# Patient Record
Sex: Female | Born: 1965 | Race: White | Hispanic: No | Marital: Married | Smoking: Former smoker
Health system: Southern US, Community
[De-identification: ages and names within clinical notes are randomized; demographics above are authoritative.]

## PROBLEM LIST (undated history)

## (undated) DIAGNOSIS — Z803 Family history of malignant neoplasm of breast: Secondary | ICD-10-CM

## (undated) DIAGNOSIS — Z800 Family history of malignant neoplasm of digestive organs: Secondary | ICD-10-CM

## (undated) DIAGNOSIS — E079 Disorder of thyroid, unspecified: Secondary | ICD-10-CM

## (undated) DIAGNOSIS — D239 Other benign neoplasm of skin, unspecified: Secondary | ICD-10-CM

## (undated) DIAGNOSIS — F32A Depression, unspecified: Secondary | ICD-10-CM

## (undated) DIAGNOSIS — F329 Major depressive disorder, single episode, unspecified: Secondary | ICD-10-CM

## (undated) DIAGNOSIS — I10 Essential (primary) hypertension: Secondary | ICD-10-CM

## (undated) DIAGNOSIS — F509 Eating disorder, unspecified: Secondary | ICD-10-CM

## (undated) DIAGNOSIS — Z973 Presence of spectacles and contact lenses: Secondary | ICD-10-CM

## (undated) DIAGNOSIS — E039 Hypothyroidism, unspecified: Secondary | ICD-10-CM

## (undated) HISTORY — DX: Family history of malignant neoplasm of breast: Z80.3

## (undated) HISTORY — PX: CHOLECYSTECTOMY: SHX55

## (undated) HISTORY — PX: TUBAL LIGATION: SHX77

## (undated) HISTORY — DX: Family history of malignant neoplasm of digestive organs: Z80.0

## (undated) HISTORY — DX: Other benign neoplasm of skin, unspecified: D23.9

## (undated) HISTORY — PX: DILATION AND CURETTAGE OF UTERUS: SHX78

## (undated) SURGERY — LAPAROSCOPY, DIAGNOSTIC
Anesthesia: General

---

## 2000-04-21 ENCOUNTER — Other Ambulatory Visit: Admission: RE | Admit: 2000-04-21 | Discharge: 2000-04-21 | Payer: Self-pay | Admitting: Family Medicine

## 2001-02-16 ENCOUNTER — Encounter (HOSPITAL_COMMUNITY): Admission: RE | Admit: 2001-02-16 | Discharge: 2001-05-17 | Payer: Self-pay | Admitting: Family Medicine

## 2002-05-24 ENCOUNTER — Other Ambulatory Visit: Admission: RE | Admit: 2002-05-24 | Discharge: 2002-05-24 | Payer: Self-pay | Admitting: Family Medicine

## 2004-12-08 ENCOUNTER — Other Ambulatory Visit: Admission: RE | Admit: 2004-12-08 | Discharge: 2004-12-08 | Payer: Self-pay | Admitting: Family Medicine

## 2005-11-29 ENCOUNTER — Encounter: Admission: RE | Admit: 2005-11-29 | Discharge: 2005-11-29 | Payer: Self-pay | Admitting: Family Medicine

## 2005-12-09 ENCOUNTER — Other Ambulatory Visit: Admission: RE | Admit: 2005-12-09 | Discharge: 2005-12-09 | Payer: Self-pay | Admitting: Family Medicine

## 2007-02-08 ENCOUNTER — Other Ambulatory Visit: Admission: RE | Admit: 2007-02-08 | Discharge: 2007-02-08 | Payer: Self-pay | Admitting: Family Medicine

## 2008-02-19 ENCOUNTER — Encounter: Admission: RE | Admit: 2008-02-19 | Discharge: 2008-02-19 | Payer: Self-pay | Admitting: Family Medicine

## 2008-03-17 ENCOUNTER — Other Ambulatory Visit: Admission: RE | Admit: 2008-03-17 | Discharge: 2008-03-17 | Payer: Self-pay | Admitting: Family Medicine

## 2009-04-08 ENCOUNTER — Other Ambulatory Visit: Admission: RE | Admit: 2009-04-08 | Discharge: 2009-04-08 | Payer: Self-pay | Admitting: Family Medicine

## 2009-09-16 ENCOUNTER — Encounter: Admission: RE | Admit: 2009-09-16 | Discharge: 2009-09-16 | Payer: Self-pay | Admitting: Family Medicine

## 2010-07-20 ENCOUNTER — Encounter: Admission: RE | Admit: 2010-07-20 | Discharge: 2010-07-20 | Payer: Self-pay | Admitting: Family Medicine

## 2010-08-24 ENCOUNTER — Emergency Department (HOSPITAL_COMMUNITY): Admission: EM | Admit: 2010-08-24 | Discharge: 2010-08-24 | Payer: Self-pay | Admitting: Emergency Medicine

## 2010-09-15 ENCOUNTER — Ambulatory Visit (HOSPITAL_COMMUNITY): Admission: RE | Admit: 2010-09-15 | Discharge: 2010-09-15 | Payer: Self-pay | Admitting: Obstetrics and Gynecology

## 2010-10-13 ENCOUNTER — Encounter
Admission: RE | Admit: 2010-10-13 | Discharge: 2010-10-13 | Payer: Self-pay | Source: Home / Self Care | Admitting: Obstetrics and Gynecology

## 2010-11-29 ENCOUNTER — Encounter: Payer: Self-pay | Admitting: Family Medicine

## 2010-12-08 ENCOUNTER — Other Ambulatory Visit (HOSPITAL_COMMUNITY): Payer: Self-pay | Admitting: Obstetrics and Gynecology

## 2010-12-08 DIAGNOSIS — N971 Female infertility of tubal origin: Secondary | ICD-10-CM

## 2010-12-13 ENCOUNTER — Ambulatory Visit (HOSPITAL_COMMUNITY)
Admission: RE | Admit: 2010-12-13 | Discharge: 2010-12-13 | Disposition: A | Discharge status: Home / Self Care | Payer: 59 | Source: Ambulatory Visit | Attending: Obstetrics and Gynecology | Admitting: Obstetrics and Gynecology

## 2010-12-13 ENCOUNTER — Encounter (HOSPITAL_COMMUNITY): Payer: Self-pay

## 2010-12-13 DIAGNOSIS — N971 Female infertility of tubal origin: Secondary | ICD-10-CM

## 2010-12-13 DIAGNOSIS — Z3049 Encounter for surveillance of other contraceptives: Secondary | ICD-10-CM | POA: Insufficient documentation

## 2011-01-18 LAB — CBC
HCT: 38.4 % (ref 36.0–46.0)
Hemoglobin: 13.3 g/dL (ref 12.0–15.0)
MCH: 33.7 pg (ref 26.0–34.0)
MCHC: 34.5 g/dL (ref 30.0–36.0)
MCV: 97.4 fL (ref 78.0–100.0)
Platelets: 269 10*3/uL (ref 150–400)
RBC: 3.94 MIL/uL (ref 3.87–5.11)
RDW: 12.3 % (ref 11.5–15.5)
WBC: 9.7 10*3/uL (ref 4.0–10.5)

## 2011-01-18 LAB — PREGNANCY, URINE: Preg Test, Ur: NEGATIVE

## 2011-01-20 LAB — CBC
HCT: 38.8 % (ref 36.0–46.0)
Hemoglobin: 13.4 g/dL (ref 12.0–15.0)
MCH: 33.8 pg (ref 26.0–34.0)
MCHC: 34.6 g/dL (ref 30.0–36.0)
MCV: 97.8 fL (ref 78.0–100.0)
Platelets: 253 10*3/uL (ref 150–400)
RBC: 3.97 MIL/uL (ref 3.87–5.11)
RDW: 12.6 % (ref 11.5–15.5)
WBC: 18.9 10*3/uL — ABNORMAL HIGH (ref 4.0–10.5)

## 2011-02-02 LAB — PREGNANCY, URINE: Preg Test, Ur: NEGATIVE

## 2011-09-15 ENCOUNTER — Other Ambulatory Visit: Payer: Self-pay | Admitting: Obstetrics and Gynecology

## 2011-09-15 DIAGNOSIS — Z1231 Encounter for screening mammogram for malignant neoplasm of breast: Secondary | ICD-10-CM

## 2011-10-19 ENCOUNTER — Ambulatory Visit: Payer: 59

## 2011-10-26 ENCOUNTER — Other Ambulatory Visit: Payer: Self-pay | Admitting: Obstetrics and Gynecology

## 2011-11-14 ENCOUNTER — Ambulatory Visit: Payer: 59

## 2011-11-14 DIAGNOSIS — F411 Generalized anxiety disorder: Secondary | ICD-10-CM

## 2011-11-14 DIAGNOSIS — Z7721 Contact with and (suspected) exposure to potentially hazardous body fluids: Secondary | ICD-10-CM

## 2011-12-18 ENCOUNTER — Encounter (HOSPITAL_COMMUNITY): Payer: Self-pay

## 2011-12-18 ENCOUNTER — Inpatient Hospital Stay (HOSPITAL_COMMUNITY)
Admission: EM | Admit: 2011-12-18 | Discharge: 2011-12-20 | DRG: 603 | Disposition: A | Discharge status: Home / Self Care | Payer: 59 | Attending: Internal Medicine | Admitting: Internal Medicine

## 2011-12-18 DIAGNOSIS — F32A Depression, unspecified: Secondary | ICD-10-CM | POA: Diagnosis present

## 2011-12-18 DIAGNOSIS — D72829 Elevated white blood cell count, unspecified: Secondary | ICD-10-CM | POA: Diagnosis present

## 2011-12-18 DIAGNOSIS — F411 Generalized anxiety disorder: Secondary | ICD-10-CM | POA: Diagnosis present

## 2011-12-18 DIAGNOSIS — A4901 Methicillin susceptible Staphylococcus aureus infection, unspecified site: Secondary | ICD-10-CM | POA: Diagnosis present

## 2011-12-18 DIAGNOSIS — L03211 Cellulitis of face: Principal | ICD-10-CM | POA: Diagnosis present

## 2011-12-18 DIAGNOSIS — L0201 Cutaneous abscess of face: Principal | ICD-10-CM | POA: Diagnosis present

## 2011-12-18 DIAGNOSIS — E039 Hypothyroidism, unspecified: Secondary | ICD-10-CM | POA: Diagnosis present

## 2011-12-18 DIAGNOSIS — L03213 Periorbital cellulitis: Secondary | ICD-10-CM | POA: Diagnosis present

## 2011-12-18 DIAGNOSIS — F3289 Other specified depressive episodes: Secondary | ICD-10-CM | POA: Diagnosis present

## 2011-12-18 DIAGNOSIS — I10 Essential (primary) hypertension: Secondary | ICD-10-CM | POA: Diagnosis present

## 2011-12-18 DIAGNOSIS — F419 Anxiety disorder, unspecified: Secondary | ICD-10-CM | POA: Diagnosis present

## 2011-12-18 DIAGNOSIS — F329 Major depressive disorder, single episode, unspecified: Secondary | ICD-10-CM | POA: Diagnosis present

## 2011-12-18 HISTORY — DX: Disorder of thyroid, unspecified: E07.9

## 2011-12-18 HISTORY — DX: Essential (primary) hypertension: I10

## 2011-12-18 HISTORY — DX: Hypothyroidism, unspecified: E03.9

## 2011-12-18 LAB — POCT I-STAT, CHEM 8
BUN: 9 mg/dL (ref 6–23)
Calcium, Ion: 1.29 mmol/L (ref 1.12–1.32)
Chloride: 102 mEq/L (ref 96–112)
Creatinine, Ser: 0.6 mg/dL (ref 0.50–1.10)
Glucose, Bld: 111 mg/dL — ABNORMAL HIGH (ref 70–99)
HCT: 39 % (ref 36.0–46.0)
Hemoglobin: 13.3 g/dL (ref 12.0–15.0)
Potassium: 4.1 mEq/L (ref 3.5–5.1)
Sodium: 140 mEq/L (ref 135–145)
TCO2: 29 mmol/L (ref 0–100)

## 2011-12-18 MED ORDER — SODIUM CHLORIDE 0.9 % IV BOLUS (SEPSIS)
1000.0000 mL | Freq: Once | INTRAVENOUS | Status: AC
  Administered 2011-12-19: 1000 mL via INTRAVENOUS

## 2011-12-18 MED ORDER — VANCOMYCIN HCL IN DEXTROSE 1-5 GM/200ML-% IV SOLN
1000.0000 mg | Freq: Once | INTRAVENOUS | Status: AC
  Administered 2011-12-19: 1000 mg via INTRAVENOUS
  Filled 2011-12-18 (×2): qty 200

## 2011-12-18 MED ORDER — HYDROMORPHONE HCL PF 1 MG/ML IJ SOLN
1.0000 mg | Freq: Once | INTRAMUSCULAR | Status: AC
  Administered 2011-12-19: 1 mg via INTRAVENOUS
  Filled 2011-12-18: qty 1

## 2011-12-18 NOTE — ED Notes (Signed)
Pt states that she had a pimple looking bump on her forehead Thursday and was seen at the clinic yesterday, now its in both eyes and her face and head are sore

## 2011-12-18 NOTE — ED Provider Notes (Signed)
History     CSN: [id number]  Arrival date & time 12/18/11  2133   First MD Initiated Contact with Patient 12/18/11 2243      Chief Complaint  Patient presents with  . Cellulitis     HPI  History provided by the patient. Patient is a 46 year old female with history of hypertension and hypothyroidism who presents with complaints of worsening facial swelling and infection. Patient reports having symptoms began 3 days ago with a small pimple-like nodule to her right eyebrow area. Area began to grow as well with erythema and swelling around right eye. Patient went to the walk-in clinic yesterday and was given prescription for doxycycline for possible skin infection. Patient began taking this but woke up this morning with swelling around left eye as well. Swelling continued to worsen around both eyes throughout the day. Patient reports increasing pain with generalized headache as well. She denies changes in vision. She reports some pain with movement right eye. Patient denies having any fever, chills, sweats. Patient denies any recent nasal congestion or sinus infection.    Past Medical History  Diagnosis Date  . Thyroid disease   . Hypertension     History reviewed. No pertinent past surgical history.  History reviewed. No pertinent family history.  History  Substance Use Topics  . Smoking status: Not on file  . Smokeless tobacco: Not on file  . Alcohol Use: No    OB History    Grav Para Term Preterm Abortions TAB SAB Ect Mult Living                  Review of Systems  Constitutional: Negative for fever and chills.  Eyes: Positive for pain. Negative for photophobia and visual disturbance.  Respiratory: Negative for shortness of breath.   Gastrointestinal: Negative for nausea and vomiting.    Allergies  Review of patient's allergies indicates no known allergies.  Home Medications   Current Outpatient Rx  Name Route Sig Dispense Refill  . ALPRAZOLAM 0.25 MG PO TABS  Oral Take 0.25 mg by mouth 3 (three) times daily as needed.    . BUPROPION HCL ER (XL) 300 MG PO TB24 Oral Take 300 mg by mouth daily.    Marland Kitchen CETIRIZINE HCL 10 MG PO TABS Oral Take 10 mg by mouth daily.    Marland Kitchen VITAMIN D 1000 UNITS PO TABS Oral Take 2,000 Units by mouth daily.    Marland Kitchen FLUOXETINE HCL 40 MG PO CAPS Oral Take 40 mg by mouth daily.    Marland Kitchen LEVOTHYROXINE SODIUM 100 MCG PO TABS Oral Take 100 mcg by mouth daily.    Marland Kitchen LISINOPRIL 10 MG PO TABS Oral Take 10 mg by mouth daily.    Marland Kitchen ZOLPIDEM TARTRATE 10 MG PO TABS Oral Take 10 mg by mouth at bedtime as needed. Insomnia      BP 143/91  Pulse 86  Temp(Src) 99.5 F (37.5 C) (Oral)  Resp 20  SpO2 99%  Physical Exam  Nursing note and vitals reviewed. Constitutional: She is oriented to person, place, and time. She appears well-developed and well-nourished. No distress.  HENT:  Head: Normocephalic and atraumatic.  Right Ear: External ear normal.  Left Ear: External ear normal.  Mouth/Throat: Oropharynx is clear and moist.       Bilateral periorbital swelling with erythema. 1 cm erythematous nodule with pointing above medial right eyebrow. No active drainage.  Eyes: Conjunctivae are normal. Pupils are equal, round, and reactive to light.  Neck: Normal range  of motion. Neck supple.  Cardiovascular: Normal rate and regular rhythm.   Pulmonary/Chest: Effort normal and breath sounds normal. No respiratory distress. She has no wheezes.  Neurological: She is alert and oriented to person, place, and time.  Skin: Skin is warm and dry. Rash noted. There is erythema.       See HEENT exam  Psychiatric: She has a normal mood and affect. Her behavior is normal.    ED Course  Procedures   INCISION AND DRAINAGE Performed by: Angus Seller Consent: Verbal consent obtained. Risks and benefits: risks, benefits and alternatives were discussed Type: abscess  Body area: Right eyebrow  Anesthesia: local infiltration  Local anesthetic: lidocaine 2% with  epinephrine  Anesthetic total: 1 ml  Complexity: complex Blunt dissection to break up loculations  Drainage: purulent  Drainage amount: Small to moderate    Patient tolerance: Patient tolerated the procedure well with no immediate complications.  Results for orders placed during the hospital encounter of 12/18/11  CBC      Component Value Range   WBC 13.2 (*) 4.0 - 10.5 (K/uL)   RBC 3.60 (*) 3.87 - 5.11 (MIL/uL)   Hemoglobin 12.9  12.0 - 15.0 (g/dL)   HCT 40.9  81.1 - 91.4 (%)   MCV 101.4 (*) 78.0 - 100.0 (fL)   MCH 35.8 (*) 26.0 - 34.0 (pg)   MCHC 35.3  30.0 - 36.0 (g/dL)   RDW 78.2  95.6 - 21.3 (%)   Platelets 261  150 - 400 (K/uL)  DIFFERENTIAL      Component Value Range   Neutrophils Relative 69  43 - 77 (%)   Neutro Abs 9.0 (*) 1.7 - 7.7 (K/uL)   Lymphocytes Relative 23  12 - 46 (%)   Lymphs Abs 3.0  0.7 - 4.0 (K/uL)   Monocytes Relative 6  3 - 12 (%)   Monocytes Absolute 0.7  0.1 - 1.0 (K/uL)   Eosinophils Relative 3  0 - 5 (%)   Eosinophils Absolute 0.3  0.0 - 0.7 (K/uL)   Basophils Relative 0  0 - 1 (%)   Basophils Absolute 0.0  0.0 - 0.1 (K/uL)  POCT I-STAT, CHEM 8      Component Value Range   Sodium 140  135 - 145 (mEq/L)   Potassium 4.1  3.5 - 5.1 (mEq/L)   Chloride 102  96 - 112 (mEq/L)   BUN 9  6 - 23 (mg/dL)   Creatinine, Ser 0.86  0.50 - 1.10 (mg/dL)   Glucose, Bld 578 (*) 70 - 99 (mg/dL)   Calcium, Ion 4.69  6.29 - 1.32 (mmol/L)   TCO2 29  0 - 100 (mmol/L)   Hemoglobin 13.3  12.0 - 15.0 (g/dL)   HCT 52.8  41.3 - 24.4 (%)     1. Periorbital cellulitis   2. Abscess of forehead       MDM  10:50 PM patient seen and evaluated. Patient in no acute distress.  Pt seen and discussed with Attending physician. Plan to admit for facial cellulitis      Angus Seller, [place] 12/19/11 [phone]

## 2011-12-19 ENCOUNTER — Encounter (HOSPITAL_COMMUNITY): Payer: Self-pay | Admitting: *Deleted

## 2011-12-19 ENCOUNTER — Inpatient Hospital Stay (HOSPITAL_COMMUNITY): Payer: 59

## 2011-12-19 LAB — DIFFERENTIAL
Basophils Absolute: 0 10*3/uL (ref 0.0–0.1)
Basophils Relative: 0 % (ref 0–1)
Eosinophils Absolute: 0.3 10*3/uL (ref 0.0–0.7)
Eosinophils Relative: 3 % (ref 0–5)
Lymphocytes Relative: 23 % (ref 12–46)
Lymphs Abs: 3 10*3/uL (ref 0.7–4.0)
Monocytes Absolute: 0.7 10*3/uL (ref 0.1–1.0)
Monocytes Relative: 6 % (ref 3–12)
Neutro Abs: 9 10*3/uL — ABNORMAL HIGH (ref 1.7–7.7)
Neutrophils Relative %: 69 % (ref 43–77)

## 2011-12-19 LAB — CBC
HCT: 34.8 % — ABNORMAL LOW (ref 36.0–46.0)
HCT: 36.5 % (ref 36.0–46.0)
Hemoglobin: 11.6 g/dL — ABNORMAL LOW (ref 12.0–15.0)
Hemoglobin: 12.9 g/dL (ref 12.0–15.0)
MCH: 33.8 pg (ref 26.0–34.0)
MCH: 35.8 pg — ABNORMAL HIGH (ref 26.0–34.0)
MCHC: 33.3 g/dL (ref 30.0–36.0)
MCHC: 35.3 g/dL (ref 30.0–36.0)
MCV: 101.4 fL — ABNORMAL HIGH (ref 78.0–100.0)
MCV: 101.5 fL — ABNORMAL HIGH (ref 78.0–100.0)
Platelets: 259 10*3/uL (ref 150–400)
Platelets: 261 10*3/uL (ref 150–400)
RBC: 3.43 MIL/uL — ABNORMAL LOW (ref 3.87–5.11)
RBC: 3.6 MIL/uL — ABNORMAL LOW (ref 3.87–5.11)
RDW: 12.4 % (ref 11.5–15.5)
RDW: 12.4 % (ref 11.5–15.5)
WBC: 12.5 10*3/uL — ABNORMAL HIGH (ref 4.0–10.5)
WBC: 13.2 10*3/uL — ABNORMAL HIGH (ref 4.0–10.5)

## 2011-12-19 LAB — CREATININE, SERUM
Creatinine, Ser: 0.64 mg/dL (ref 0.50–1.10)
GFR calc Af Amer: >90 mL/min (ref >90)
GFR calc non Af Amer: >90 mL/min (ref >90)

## 2011-12-19 LAB — TSH: TSH: 7.36 u[IU]/mL — ABNORMAL HIGH (ref 0.350–4.500)

## 2011-12-19 MED ORDER — ONDANSETRON HCL 4 MG PO TABS: 4.0000 mg | ORAL_TABLET | Freq: Four times a day (QID) | ORAL | Status: DC | PRN

## 2011-12-19 MED ORDER — OXYCODONE HCL 5 MG PO TABS
5.0000 mg | ORAL_TABLET | ORAL | Status: DC | PRN
  Administered 2011-12-19 – 2011-12-20 (×7): 5 mg via ORAL
  Filled 2011-12-19 (×7): qty 1

## 2011-12-19 MED ORDER — ALUM & MAG HYDROXIDE-SIMETH 200-200-20 MG/5ML PO SUSP: 30.0000 mL | Freq: Four times a day (QID) | ORAL | Status: DC | PRN

## 2011-12-19 MED ORDER — VANCOMYCIN HCL IN DEXTROSE 1-5 GM/200ML-% IV SOLN
1000.0000 mg | Freq: Two times a day (BID) | INTRAVENOUS | Status: DC
  Administered 2011-12-19 – 2011-12-20 (×3): 1000 mg via INTRAVENOUS
  Filled 2011-12-19 (×5): qty 200

## 2011-12-19 MED ORDER — FLUOXETINE HCL 20 MG PO CAPS
40.0000 mg | ORAL_CAPSULE | Freq: Every day | ORAL | Status: DC
  Administered 2011-12-19 – 2011-12-20 (×2): 40 mg via ORAL
  Filled 2011-12-19 (×2): qty 2

## 2011-12-19 MED ORDER — HYDROMORPHONE HCL PF 1 MG/ML IJ SOLN
1.0000 mg | Freq: Once | INTRAMUSCULAR | Status: AC
  Administered 2011-12-19: 1 mg via INTRAVENOUS
  Filled 2011-12-19: qty 1

## 2011-12-19 MED ORDER — ONDANSETRON HCL 4 MG/2ML IJ SOLN: 4.0000 mg | Freq: Four times a day (QID) | INTRAMUSCULAR | Status: DC | PRN

## 2011-12-19 MED ORDER — ALPRAZOLAM 0.25 MG PO TABS: 0.2500 mg | ORAL_TABLET | Freq: Three times a day (TID) | ORAL | Status: DC | PRN

## 2011-12-19 MED ORDER — LORATADINE 10 MG PO TABS
10.0000 mg | ORAL_TABLET | Freq: Every day | ORAL | Status: DC | PRN
  Filled 2011-12-19: qty 1

## 2011-12-19 MED ORDER — LISINOPRIL 10 MG PO TABS
10.0000 mg | ORAL_TABLET | Freq: Every day | ORAL | Status: DC
  Administered 2011-12-19 – 2011-12-20 (×2): 10 mg via ORAL
  Filled 2011-12-19 (×2): qty 1

## 2011-12-19 MED ORDER — IOHEXOL 300 MG/ML  SOLN
100.0000 mL | Freq: Once | INTRAMUSCULAR | Status: AC | PRN
  Administered 2011-12-19: 100 mL via INTRAVENOUS

## 2011-12-19 MED ORDER — ENOXAPARIN SODIUM 40 MG/0.4ML ~~LOC~~ SOLN
40.0000 mg | SUBCUTANEOUS | Status: DC
  Administered 2011-12-19 – 2011-12-20 (×2): 40 mg via SUBCUTANEOUS
  Filled 2011-12-19 (×2): qty 0.4

## 2011-12-19 MED ORDER — LEVOTHYROXINE SODIUM 100 MCG PO TABS
100.0000 ug | ORAL_TABLET | Freq: Every day | ORAL | Status: DC
  Administered 2011-12-19 – 2011-12-20 (×2): 100 ug via ORAL
  Filled 2011-12-19 (×2): qty 1

## 2011-12-19 MED ORDER — ACETAMINOPHEN 650 MG RE SUPP: 650.0000 mg | Freq: Four times a day (QID) | RECTAL | Status: DC | PRN

## 2011-12-19 MED ORDER — ZOLPIDEM TARTRATE 10 MG PO TABS: 10.0000 mg | ORAL_TABLET | Freq: Every evening | ORAL | Status: DC | PRN

## 2011-12-19 MED ORDER — ACETAMINOPHEN 325 MG PO TABS
650.0000 mg | ORAL_TABLET | Freq: Four times a day (QID) | ORAL | Status: DC | PRN
  Administered 2011-12-19 – 2011-12-20 (×3): 650 mg via ORAL
  Filled 2011-12-19 (×3): qty 2

## 2011-12-19 NOTE — H&P (Signed)
PCP:   Cala Bradford, MD, MD   Chief Complaint:  Forehead area pain, erythema and edema involving orbits right greater than left.  HPI: This 46 year old female presents to [facility] long emergency room for evaluation. She had what appeared to be a pimple to the right forehead area start about Wednesday of last week. This progressed with increased swelling, pain, erythema, and edema that began to involve first the right and in the left orbit with swelling and erythema. Went to [place] [facility] and was given an injection of unknown antibiotic and then started on by mouth doxycycline. Despite the antibiotics she found that the pain and swelling continued to worsen and she comes to the emergency room today. She denies any fever at home. No nausea or vomiting or associated chills. She has had no visual changes associated with this presentation. Since arrival at the emergency room she had incision and drainage of the wound with swab sent for culture. Noted to have a low-grade temperature at 99.5. Also leukocytosis at 13.2. She was given a dose of vancomycin by the emergency department physician. She was referred to triad hospitalist for admission will go to team 1. Review of Systems:  The patient denies anorexia, fever, weight loss,, vision loss, decreased hearing, hoarseness, chest pain, syncope, dyspnea on exertion, peripheral edema, balance deficits, hemoptysis, abdominal pain, melena, hematochezia, severe indigestion/heartburn, hematuria, incontinence, genital sores, muscle weakness, suspicious skin lesions, transient blindness, difficulty walking, depression, unusual weight change, abnormal bleeding, enlarged lymph nodes, angioedema, and breast masses.  Past Medical History: Past Medical History  Diagnosis Date  . Thyroid disease   . Hypertension    History reviewed. No pertinent past surgical history. History also includes allergic rhinitis, depression, anxiety.  Medications: Prior to  Admission medications   Medication Sig Start Date End Date Taking? Authorizing Provider  ALPRAZolam (XANAX) 0.25 MG tablet Take 0.25 mg by mouth 3 (three) times daily as needed.   Yes Historical Provider, MD  buPROPion (WELLBUTRIN XL) 300 MG 24 hr tablet Take 300 mg by mouth daily.  Discontinued NO Historical Provider, MD  cetirizine (ZYRTEC) 10 MG tablet Take 10 mg by mouth daily.   Yes Historical Provider, MD  cholecalciferol (VITAMIN D) 1000 UNITS tablet Take 2,000 Units by mouth daily.   Yes Historical Provider, MD  FLUoxetine (PROZAC) 40 MG capsule Take 40 mg by mouth daily.   Yes Historical Provider, MD  levothyroxine (SYNTHROID, LEVOTHROID) 100 MCG tablet Take 100 mcg by mouth daily.   Yes Historical Provider, MD  lisinopril (PRINIVIL,ZESTRIL) 10 MG tablet Take 10 mg by mouth daily.   Yes Historical Provider, MD  zolpidem (AMBIEN) 10 MG tablet Take 10 mg by mouth at bedtime as needed. Insomnia   Yes Historical Provider, MD    Allergies:  No Known Allergies  Social History Patient does smoke currently tobacco at a rate of one half pack daily. She is not interested in smoking cessation. She declines a nicotine patch. States she has daily alcohol intake of one drink daily. She states she has never experienced alcohol withdrawal. She states she does occasionally use recreational drugs and smoked crack about one week ago. The patient is normally at baseline able to function independently..  Family History: Family history positive for breast cancer in mother and hypertension in father.  Physical Exam: Filed Vitals:   12/18/11 2205 12/19/11 0207 12/19/11 0346  BP: 143/91 120/72 112/70  Pulse: 86 62 65  Temp: 99.5 F (37.5 C)    TempSrc: Oral  Resp: 20 16 15   SpO2: 99% 95% 98%   HEENT head is normocephalic. There is a wound just above the right orbital area that has been surgically opened. There is edema surrounding this as well as erythema. Erythema extends to the orbits  bilaterally. There is also edema to the orbits bilaterally. Pupils are equal. Hearing normal to conversational tone. May have some cervical adenopathy. No bruits or jugular venous distention. Cardiac. Rate and rhythm regular. No jugular venous distention or edema. No murmur, S3, S4. Lungs. Breath sounds are clear and equal bilaterally. Abdomen. Soft and obese with positive bowel sounds. No pain, masses, bruits noted. Urinary genital. No bladder pain or CVA tenderness. Musculoskeletal. Strength 5/5 and equal all 4 extremities. Range of motion is full. Neurologic. Cranial nerves 2-12 grossly intact. No unilateral or focal defects. Patient is alert and oriented. Psychiatric. Patient is pleasant and conversational. Her affect is good.   Labs on Admission:   [facility] 12/18/11 2358  NA 140  K 4.1  CL 102  CO2 --  GLUCOSE 111*  BUN 9  CREATININE 0.60  CALCIUM --  MG --  PHOS --   No results found for this basename: AST:2,ALT:2,ALKPHOS:2,BILITOT:2,PROT:2,ALBUMIN:2 in the last 72 hours No results found for this basename: LIPASE:2,AMYLASE:2 in the last 72 hours  Basename 12/18/11 2358 12/18/11 2344  WBC -- 13.2*  NEUTROABS -- 9.0*  HGB 13.3 12.9  HCT 39.0 36.5  MCV -- 101.4*  PLT -- 261   No results found for this basename: CKTOTAL:3,CKMB:3,CKMBINDEX:3,TROPONINI:3 in the last 72 hours No results found for this basename: TSH,T4TOTAL,FREET3,T3FREE,THYROIDAB in the last 72 hours No results found for this basename: VITAMINB12:2,FOLATE:2,FERRITIN:2,TIBC:2,IRON:2,RETICCTPCT:2 in the last 72 hours  Radiological Exams on Admission: No results found.  Assessment/Plan Problem #1 facial cellulitis with apparent right greater than left orbital involvement. Patient to triad hospitalist team 1. Continue vancomycin per pharmacy consult. Patient is noted with low-grade temp and leukocytosis. Will follow for a repeat CBC in a.m. A swab specimen of the forehead wound has been sent for culture and  antibiotics can be adjusted per these results when available. We'll use OxyIR for pain control. We'll obtain a orbital CT scan without contrast to rule out any optical involvement. Problem #2. Anxiety. Continue home dosing of Xanax when necessary. Problem #3 depression. Please note that patient states listed Wellbutrin has been discontinued. I have continued her current Prozac at 40 mg daily. . Problem #4. Hypothyroidism. We'll continue Synthroid at home dosing. Will check a serum TSH. Problem #5. Hypertension. Continue home lisinopril at 10 mg daily. We'll follow routine blood pressures. Problem #6. Insomnia. Continue Ambien 10 mg as a when necessary drug.  After discussion with the patient, she is to be a FULL CODE.  We will respect these wishes.  I anticipate her length of stay to be 2 days based on current information..  Time spent on this patient including examination and decision-making process: 60 minutes.  Rolan Lipa [phone] 12/19/2011, 4:36 AM

## 2011-12-19 NOTE — ED Notes (Signed)
MD at bedside.Admitting NP at bedside 

## 2011-12-19 NOTE — Progress Notes (Signed)
ANTIBIOTIC CONSULT NOTE - INITIAL  Pharmacy Consult for Vancomycin Indication:   No Known Allergies  Patient Measurements: Height: 5\' 5"  (165.1 cm) Weight: 236 lb (107.049 kg) IBW/kg (Calculated) : 57  Adjusted Body Weight:   Vital Signs: Temp: 98.1 F (36.7 C) (02/11 0517) Temp src: Oral (02/11 0517) BP: 100/71 mmHg (02/11 0517) Pulse Rate: 69  (02/11 0517) Intake/Output from previous day:   Intake/Output from this shift:    Labs:  Basename 12/18/11 2358 12/18/11 2344  WBC -- 13.2*  HGB 13.3 12.9  PLT -- 261  LABCREA -- --  CREATININE 0.60 --   Estimated Creatinine Clearance: 110.2 ml/min (by C-G formula based on Cr of 0.6). No results found for this basename: VANCOTROUGH:2,VANCOPEAK:2,VANCORANDOM:2,GENTTROUGH:2,GENTPEAK:2,GENTRANDOM:2,TOBRATROUGH:2,TOBRAPEAK:2,TOBRARND:2,AMIKACINPEAK:2,AMIKACINTROU:2,AMIKACIN:2, in the last 72 hours   Microbiology: No results found for this or any previous visit (from the past 720 hour(s)).  Medical History: Past Medical History  Diagnosis Date  . Thyroid disease   . Hypertension   . Hypothyroidism     Medications:  Anti-infectives     Start     Dose/Rate Route Frequency Ordered Stop   12/19/11 0800   vancomycin (VANCOCIN) IVPB 1000 mg/200 mL premix        1,000 mg 200 mL/hr over 60 Minutes Intravenous Every 12 hours 12/19/11 0552     12/19/11 0000   vancomycin (VANCOCIN) IVPB 1000 mg/200 mL premix        1,000 mg 200 mL/hr over 60 Minutes Intravenous  Once 12/18/11 2351 12/19/11 0236         Assessment: Patient with facial cellulitis.  First dose of antibiotics already given, 1gm at 0136.  Goal of Therapy:  Vancomycin trough level 15-20 mcg/ml Higher goal due to facial aspect.  Plan:  Measure antibiotic drug levels at steady state Follow up culture results Vancomycin 1gm iv q12hr, next dose at 0800 Shorter interval, to make 1gm dose a larger load.  Darlina Guys, Jacquenette Shone Crowford 12/19/2011,5:53 AM

## 2011-12-19 NOTE — H&P (Signed)
Addendum  Patient seen and examined, chart and data base reviewed.  I agree with the above assessment and plan  For full details please see Mr. Lenny Pastel NP note.  Facial cellulitis, failed out patient oral Abx, Placed on Vancomycin.  Clint Lipps Pager: [phone] 12/19/2011, 1:43 PM

## 2011-12-20 DIAGNOSIS — L0201 Cutaneous abscess of face: Secondary | ICD-10-CM | POA: Diagnosis present

## 2011-12-20 DIAGNOSIS — I10 Essential (primary) hypertension: Secondary | ICD-10-CM | POA: Diagnosis present

## 2011-12-20 DIAGNOSIS — F329 Major depressive disorder, single episode, unspecified: Secondary | ICD-10-CM | POA: Diagnosis present

## 2011-12-20 DIAGNOSIS — F32A Depression, unspecified: Secondary | ICD-10-CM | POA: Diagnosis present

## 2011-12-20 DIAGNOSIS — D72829 Elevated white blood cell count, unspecified: Secondary | ICD-10-CM | POA: Diagnosis present

## 2011-12-20 DIAGNOSIS — E039 Hypothyroidism, unspecified: Secondary | ICD-10-CM | POA: Diagnosis present

## 2011-12-20 DIAGNOSIS — L03213 Periorbital cellulitis: Secondary | ICD-10-CM | POA: Diagnosis present

## 2011-12-20 DIAGNOSIS — F419 Anxiety disorder, unspecified: Secondary | ICD-10-CM | POA: Diagnosis present

## 2011-12-20 LAB — COMPREHENSIVE METABOLIC PANEL
ALT: 19 U/L (ref 0–35)
AST: 16 U/L (ref 0–37)
Albumin: 2.9 g/dL — ABNORMAL LOW (ref 3.5–5.2)
Alkaline Phosphatase: 56 U/L (ref 39–117)
BUN: 8 mg/dL (ref 6–23)
CO2: 27 mEq/L (ref 19–32)
Calcium: 9.4 mg/dL (ref 8.4–10.5)
Chloride: 101 mEq/L (ref 96–112)
Creatinine, Ser: 0.83 mg/dL (ref 0.50–1.10)
GFR calc Af Amer: >90 mL/min (ref >90)
GFR calc non Af Amer: 85 mL/min — ABNORMAL LOW (ref >90)
Glucose, Bld: 100 mg/dL — ABNORMAL HIGH (ref 70–99)
Potassium: 4.1 mEq/L (ref 3.5–5.1)
Sodium: 137 mEq/L (ref 135–145)
Total Bilirubin: 0.1 mg/dL — ABNORMAL LOW (ref 0.3–1.2)
Total Protein: 5.9 g/dL — ABNORMAL LOW (ref 6.0–8.3)

## 2011-12-20 LAB — CBC
HCT: 34.6 % — ABNORMAL LOW (ref 36.0–46.0)
Hemoglobin: 11.8 g/dL — ABNORMAL LOW (ref 12.0–15.0)
MCH: 34.6 pg — ABNORMAL HIGH (ref 26.0–34.0)
MCHC: 34.1 g/dL (ref 30.0–36.0)
MCV: 101.5 fL — ABNORMAL HIGH (ref 78.0–100.0)
Platelets: 264 10*3/uL (ref 150–400)
RBC: 3.41 MIL/uL — ABNORMAL LOW (ref 3.87–5.11)
RDW: 12.5 % (ref 11.5–15.5)
WBC: 8.8 10*3/uL (ref 4.0–10.5)

## 2011-12-20 MED ORDER — CLINDAMYCIN HCL 300 MG PO CAPS: 300.0000 mg | ORAL_CAPSULE | Freq: Four times a day (QID) | ORAL | Status: AC

## 2011-12-20 MED ORDER — LEVOTHYROXINE SODIUM 112 MCG PO TABS: 112.0000 ug | ORAL_TABLET | Freq: Every day | ORAL | Status: DC

## 2011-12-20 NOTE — Discharge Summary (Signed)
HOSPITAL DISCHARGE SUMMARY  Kimberly Stanley  MRN: [id number]  DOB:Dec 21, 1965  Date of Admission: 12/18/2011 Date of Discharge: 12/20/2011         LOS: 2 days   Attending Physician:Amorette Charrette A  Patient's WUJ:WJXBJ,YNWGNFA S, MD, MD  Consults: None  Discharge Diagnoses: Present on Admission:  .Preseptal cellulitis .Leukocytosis .HTN (hypertension) .Anxiety and depression .Hypothyroidism .Abscess of forehead   Medication List  As of 12/20/2011  9:02 AM   TAKE these medications         ALPRAZolam 0.25 MG tablet   Commonly known as: XANAX   Take 0.25 mg by mouth 3 (three) times daily as needed.      buPROPion 300 MG 24 hr tablet   Commonly known as: WELLBUTRIN XL   Take 300 mg by mouth daily.      cetirizine 10 MG tablet   Commonly known as: ZYRTEC   Take 10 mg by mouth daily.      cholecalciferol 1000 UNITS tablet   Commonly known as: VITAMIN D   Take 2,000 Units by mouth daily.      clindamycin 300 MG capsule   Commonly known as: CLEOCIN   Take 1 capsule (300 mg total) by mouth 4 (four) times daily.      FLUoxetine 40 MG capsule   Commonly known as: PROZAC   Take 40 mg by mouth daily.      levothyroxine 112 MCG tablet   Commonly known as: SYNTHROID, LEVOTHROID   Take 1 tablet (112 mcg total) by mouth daily.      lisinopril 10 MG tablet   Commonly known as: PRINIVIL,ZESTRIL   Take 10 mg by mouth daily.      zolpidem 10 MG tablet   Commonly known as: AMBIEN   Take 10 mg by mouth at bedtime as needed. Insomnia             Brief Admission History: This 46 year old female presents to [facility] long emergency room for evaluation. She had what appeared to be a pimple to the right forehead area start about Wednesday of last week. This progressed with increased swelling, pain, erythema, and edema that began to involve first the right and in the left orbit with swelling and erythema. Went to [place] [facility] and was given an injection of unknown antibiotic  and then started on by mouth doxycycline. Despite the antibiotics she found that the pain and swelling continued to worsen and she comes to the emergency room today. She denies any fever at home. No nausea or vomiting or associated chills. She has had no visual changes associated with this presentation. Since arrival at the emergency room she had incision and drainage of the wound with swab sent for culture. Noted to have a low-grade temperature at 99.5. Also leukocytosis at 13.2. She was given a dose of vancomycin by the emergency department physician. She was referred to triad hospitalist for admission will go to team 1.  Hospital Course: Present on Admission:  .Preseptal cellulitis .Leukocytosis .HTN (hypertension) .Anxiety and depression .Hypothyroidism .Abscess of forehead  1. Preseptal cellulitis and abscess of forehead: The abscess drained in the emergency department. Patient started on vancomycin. She is afebrile for the next 24 hours. Patient leukocytosis improved from 13.2 to normal counts on discharge. She has CT scan of the orbits showed no orbital involvement. She has small phlegmon related to the drainage area. The redness and the swelling is much better in the forehead. The patient was deemed appropriate to go home  on oral clindamycin for 7 more days, the culture from the abscess shows staph aureus no identification was back at the time of discharge..  2. Hypothyroidism: TSH is high with a level of 7.36, patient is taking 100 mcg of levothyroxine I increased that to 112. Patient will need to check her TSH in 3 weeks.  3. Leukocytosis: This is related to the cellulitis and the abscess resolved after the antibiotics.  Day of Discharge BP 120/77  Pulse 53  Temp(Src) 98.2 F (36.8 C) (Oral)  Resp 20  Ht 5\' 5"  (1.651 m)  Wt 107.049 kg (236 lb)  BMI 39.27 kg/m2  SpO2 100%  LMP 09/17/2010 Physical Exam: GEN: No acute distress, cooperative with exam PSYCH: He is alert and  oriented x4; does not appear anxious does not appear depressed; affect is normal  HEENT: Mucous membranes pink and anicteric;  Mouth: without oral thrush or lesions Eyes: PERRLA; EOM intact;  Neck: no cervical lymphadenopathy nor thyromegaly or carotid bruit; no JVD;  CHEST WALL: No tenderness, symmetrical to breathing bilaterally CHEST: Normal respiration, clear to auscultation bilaterally  HEART: Regular rate and rhythm; no murmurs, rubs or gallops, S1 and S2 heard  BACK: No kyphosis or scoliosis; no CVA tenderness  ABDOMEN:  soft non-tender; no masses, no organomegaly, normal abdominal bowel sounds; no pannus; no intertriginous candida.  EXTREMITIES: No bone or joint deformity; no edema; no ulcerations.  PULSES: 2+ and symmetric, neurovascularity is intact SKIN: Normal hydration no rash or ulceration, no flushing or suspicious lesions  CNS: Cranial nerves 2-12 grossly intact no focal neurologic deficit, coordination is intact gait not tested    Results for orders placed during the hospital encounter of 12/18/11 (from the past 24 hour(s))  COMPREHENSIVE METABOLIC PANEL     Status: Abnormal   Collection Time   12/20/11  3:53 AM      Component Value Range   Sodium 137  135 - 145 (mEq/L)   Potassium 4.1  3.5 - 5.1 (mEq/L)   Chloride 101  96 - 112 (mEq/L)   CO2 27  19 - 32 (mEq/L)   Glucose, Bld 100 (*) 70 - 99 (mg/dL)   BUN 8  6 - 23 (mg/dL)   Creatinine, Ser 4.09  0.50 - 1.10 (mg/dL)   Calcium 9.4  8.4 - 81.1 (mg/dL)   Total Protein 5.9 (*) 6.0 - 8.3 (g/dL)   Albumin 2.9 (*) 3.5 - 5.2 (g/dL)   AST 16  0 - 37 (U/L)   ALT 19  0 - 35 (U/L)   Alkaline Phosphatase 56  39 - 117 (U/L)   Total Bilirubin 0.1 (*) 0.3 - 1.2 (mg/dL)   GFR calc non Af Amer 85 (*) >90 (mL/min)   GFR calc Af Amer >90  >90 (mL/min)  CBC     Status: Abnormal   Collection Time   12/20/11  3:53 AM      Component Value Range   WBC 8.8  4.0 - 10.5 (K/uL)   RBC 3.41 (*) 3.87 - 5.11 (MIL/uL)   Hemoglobin 11.8 (*)  12.0 - 15.0 (g/dL)   HCT 91.4 (*) 78.2 - 46.0 (%)   MCV 101.5 (*) 78.0 - 100.0 (fL)   MCH 34.6 (*) 26.0 - 34.0 (pg)   MCHC 34.1  30.0 - 36.0 (g/dL)   RDW 95.6  21.3 - 08.6 (%)   Platelets 264  150 - 400 (K/uL)    Disposition: Home   Follow-up Appts: Discharge Orders    Future Orders  Please Complete By Expires   Diet - low sodium heart healthy      Increase activity slowly         Follow-up Information    Follow up with Cala Bradford, MD in 1 week.   Contact information:   [address] [place] [address] [phone]          I spent 40 minutes completing paperwork and coordinating discharge efforts.  SignedClydia Llano A 12/20/2011, 9:02 AM

## 2011-12-21 LAB — WOUND CULTURE

## 2012-04-25 ENCOUNTER — Ambulatory Visit
Admission: RE | Admit: 2012-04-25 | Discharge: 2012-04-25 | Disposition: A | Discharge status: Home / Self Care | Payer: 59 | Source: Ambulatory Visit | Attending: Obstetrics and Gynecology | Admitting: Obstetrics and Gynecology

## 2012-04-25 DIAGNOSIS — Z1231 Encounter for screening mammogram for malignant neoplasm of breast: Secondary | ICD-10-CM

## 2013-04-09 ENCOUNTER — Other Ambulatory Visit: Payer: Self-pay | Admitting: Obstetrics and Gynecology

## 2013-04-09 DIAGNOSIS — Z1231 Encounter for screening mammogram for malignant neoplasm of breast: Secondary | ICD-10-CM

## 2013-05-15 ENCOUNTER — Ambulatory Visit
Admission: RE | Admit: 2013-05-15 | Discharge: 2013-05-15 | Disposition: A | Discharge status: Home / Self Care | Payer: 59 | Source: Ambulatory Visit | Attending: Obstetrics and Gynecology | Admitting: Obstetrics and Gynecology

## 2013-05-15 DIAGNOSIS — Z1231 Encounter for screening mammogram for malignant neoplasm of breast: Secondary | ICD-10-CM

## 2013-07-03 ENCOUNTER — Encounter (INDEPENDENT_AMBULATORY_CARE_PROVIDER_SITE_OTHER): Payer: Self-pay | Admitting: General Surgery

## 2013-07-03 ENCOUNTER — Ambulatory Visit (INDEPENDENT_AMBULATORY_CARE_PROVIDER_SITE_OTHER): Payer: 59 | Admitting: General Surgery

## 2013-07-03 DIAGNOSIS — I10 Essential (primary) hypertension: Secondary | ICD-10-CM

## 2013-07-03 DIAGNOSIS — F32A Depression, unspecified: Secondary | ICD-10-CM

## 2013-07-03 DIAGNOSIS — E039 Hypothyroidism, unspecified: Secondary | ICD-10-CM

## 2013-07-03 DIAGNOSIS — F329 Major depressive disorder, single episode, unspecified: Secondary | ICD-10-CM

## 2013-07-03 DIAGNOSIS — E785 Hyperlipidemia, unspecified: Secondary | ICD-10-CM

## 2013-07-03 LAB — COMPREHENSIVE METABOLIC PANEL
ALT: 13 U/L (ref 0–35)
AST: 12 U/L (ref 0–37)
Albumin: 4.7 g/dL (ref 3.5–5.2)
Alkaline Phosphatase: 54 U/L (ref 39–117)
BUN: 10 mg/dL (ref 6–23)
CO2: 24 mEq/L (ref 19–32)
Calcium: 10 mg/dL (ref 8.4–10.5)
Chloride: 103 mEq/L (ref 96–112)
Creat: 0.85 mg/dL (ref 0.50–1.10)
Glucose, Bld: 86 mg/dL (ref 70–99)
Potassium: 4 mEq/L (ref 3.5–5.3)
Sodium: 138 mEq/L (ref 135–145)
Total Bilirubin: 0.3 mg/dL (ref 0.3–1.2)
Total Protein: 7 g/dL (ref 6.0–8.3)

## 2013-07-03 LAB — CBC WITH DIFFERENTIAL/PLATELET
Basophils Absolute: 0.1 10*3/uL (ref 0.0–0.1)
Basophils Relative: 0 % (ref 0–1)
Eosinophils Absolute: 0.4 10*3/uL (ref 0.0–0.7)
Eosinophils Relative: 3 % (ref 0–5)
HCT: 39.6 % (ref 36.0–46.0)
Hemoglobin: 13.9 g/dL (ref 12.0–15.0)
Lymphocytes Relative: 33 % (ref 12–46)
Lymphs Abs: 4.2 10*3/uL — ABNORMAL HIGH (ref 0.7–4.0)
MCH: 33.5 pg (ref 26.0–34.0)
MCHC: 35.1 g/dL (ref 30.0–36.0)
MCV: 95.4 fL (ref 78.0–100.0)
Monocytes Absolute: 0.8 10*3/uL (ref 0.1–1.0)
Monocytes Relative: 6 % (ref 3–12)
Neutro Abs: 7.4 10*3/uL (ref 1.7–7.7)
Neutrophils Relative %: 58 % (ref 43–77)
Platelets: 314 10*3/uL (ref 150–400)
RBC: 4.15 MIL/uL (ref 3.87–5.11)
RDW: 12.9 % (ref 11.5–15.5)
WBC: 12.7 10*3/uL — ABNORMAL HIGH (ref 4.0–10.5)

## 2013-07-03 LAB — LIPID PANEL
Cholesterol: 201 mg/dL — ABNORMAL HIGH (ref 0–200)
HDL: 38 mg/dL — ABNORMAL LOW (ref >39)
LDL Cholesterol: 123 mg/dL — ABNORMAL HIGH (ref 0–99)
Total CHOL/HDL Ratio: 5.3 Ratio
Triglycerides: 198 mg/dL — ABNORMAL HIGH (ref <150)
VLDL: 40 mg/dL (ref 0–40)

## 2013-07-03 NOTE — Progress Notes (Signed)
Patient ID: Kimberly Stanley, female   DOB: [birth year], 47 y.o.   MRN: [id number]  Chief Complaint  Patient presents with  . Bariatric Pre-op    Initial    HPI Kimberly Stanley is a 47 y.o. female.  This patient presents for her initial weight loss surgery evaluation. She has a BMI of 39 with obesity related comorbidities of hypertension, hyperlipidemia, depression, and hypothyroidism. She has a tender information session and is most interested in the Roux-en-Y gastric bypass. She says that she has struggled with her weight "forever". She's tried several diets including LA weight loss, counting calories, and diet pills which were the most effective for her. She says that she walks without about 1 mile per day of but does not do much other exercise. She denies any reflux or heartburn symptoms but she is a current smoker.   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 HPI  Past Medical History  Diagnosis Date  . Thyroid disease   . Hypertension   . Hypothyroidism     Past Surgical History  Procedure Laterality Date  . Cholecystectomy    . Dilation and curettage of uterus      Family History  Problem Relation Age of Onset  . Cancer Mother     breast  . Cancer Paternal Uncle     panctratic    Social History History  Substance Use Topics  . Smoking status: Current Every Day Smoker -- 0.50 packs/day for 15 years    Types: Cigarettes  . Smokeless tobacco: Never Used  . Alcohol Use: 11.0 oz/week    10 Shots of liquor, 10 Drinks containing 0.5 oz of alcohol per week    No Known Allergies  Current Outpatient Prescriptions  Medication Sig Dispense Refill  . aspirin 81 MG tablet Take 81 mg by mouth daily.      . busPIRone (BUSPAR) 15 MG tablet       . CALCIUM PO Take by mouth.      . cholecalciferol (VITAMIN D) 1000 UNITS tablet Take 2,000 Units by mouth daily.      Marland Kitchen FLUoxetine  (PROZAC) 40 MG capsule Take 40 mg by mouth daily.      Marland Kitchen lamoTRIgine (LAMICTAL) 150 MG tablet       . levothyroxine (SYNTHROID, LEVOTHROID) 112 MCG tablet Take 1 tablet (112 mcg total) by mouth daily.  30 tablet  0  . traZODone (DESYREL) 50 MG tablet       . valsartan-hydrochlorothiazide (DIOVAN-HCT) 320-12.5 MG per tablet       . cetirizine (ZYRTEC) 10 MG tablet Take 10 mg by mouth daily.       No current facility-administered medications for this visit.    Review of Systems Review of Systems All other review of systems negative or noncontributory except as stated in the HPI  Blood pressure 118/70, pulse 80, temperature 99.3 F (37.4 C), temperature source Oral, resp. rate 16, height 5\' 5"  (1.651 m), weight 235 lb 3.2 oz (106.686 kg).  Physical Exam Physical Exam Physical Exam  Nursing note and vitals reviewed. Constitutional: She is oriented to person, place, and time. She appears well-developed and well-nourished. No distress.  HENT:  Head: Normocephalic and atraumatic.  Mouth/Throat: No oropharyngeal exudate.  Eyes: Conjunctivae and EOM are normal. Pupils are equal, round, and reactive to light. Right eye exhibits no discharge. Left eye exhibits no discharge. No  scleral icterus.  Neck: Normal range of motion. Neck supple. No tracheal deviation present.  Cardiovascular: Normal rate, regular rhythm, normal heart sounds and intact distal pulses.   Pulmonary/Chest: Effort normal and breath sounds normal. No stridor. No respiratory distress. She has no wheezes.  Abdominal: Soft. Bowel sounds are normal. She exhibits no distension and no mass. There is no tenderness. There is no rebound and no guarding.  Musculoskeletal: Normal range of motion. She exhibits no edema and no tenderness.  Neurological: She is alert and oriented to person, place, and time.  Skin: Skin is warm and dry. No rash noted. She is not diaphoretic. No erythema. No pallor.  Psychiatric: She has a normal mood and  affect. Her behavior is normal. Judgment and thought content normal.    Data Reviewed   Assessment    Morbid obesity with a BMI of 39 and obesity-related comorbidities of hypertension, hyperlipidemia, depression, hypothyroidism We'll long discussions about the medical and surgical options for weight loss and we discussed the pros and cons of each option including the lap band, sleeve gastrectomy, and Roux-en-Y gastric bypass. She does still remain interested in Roux-en-Y gastric bypass however she is a current smoker. I had a long discussion with her about smoking cessation and the increased risks associated with weight loss surgery and Smoking a a good portion of our discussion was spent counseling her about smoking cessation.  The risks of infection, bleeding, pain, scarring, weight regain, too little or too much weight loss, vitamin deficiencies and need for lifelong vitamin supplementation, hair loss, need for protein supplementation, leaks, stricture, reflux, food intolerance, need for reoperation , need for open surgery, injury to spleen or surrounding structures, DVT's, PE, and death discussed.  She was to proceed with the workup for gastric bypass but again, I counseled her that I would not offer her any surgery as long as she was using any nicotine products     Plan    She will discuss with her primary care physicians options for smoking cessation. We will start her with a preoperative workup including laboratory studies, upper GI, nutrition and psychology evaluation.        Lodema Pilot DAVID 07/03/2013, 5:42 PM

## 2013-07-04 LAB — TSH: TSH: 2.415 u[IU]/mL (ref 0.350–4.500)

## 2013-07-04 LAB — T4: T4, Total: 13.8 ug/dL — ABNORMAL HIGH (ref 5.0–12.5)

## 2013-07-16 ENCOUNTER — Ambulatory Visit (HOSPITAL_COMMUNITY)
Admission: RE | Admit: 2013-07-16 | Discharge: 2013-07-16 | Disposition: A | Discharge status: Home / Self Care | Payer: 59 | Source: Ambulatory Visit | Attending: General Surgery | Admitting: General Surgery

## 2013-07-16 ENCOUNTER — Other Ambulatory Visit: Payer: Self-pay

## 2013-07-16 DIAGNOSIS — Z6839 Body mass index (BMI) 39.0-39.9, adult: Secondary | ICD-10-CM | POA: Insufficient documentation

## 2013-07-16 DIAGNOSIS — Z9089 Acquired absence of other organs: Secondary | ICD-10-CM | POA: Insufficient documentation

## 2013-07-16 DIAGNOSIS — F3289 Other specified depressive episodes: Secondary | ICD-10-CM | POA: Insufficient documentation

## 2013-07-16 DIAGNOSIS — E039 Hypothyroidism, unspecified: Secondary | ICD-10-CM | POA: Insufficient documentation

## 2013-07-16 DIAGNOSIS — F32A Depression, unspecified: Secondary | ICD-10-CM

## 2013-07-16 DIAGNOSIS — K449 Diaphragmatic hernia without obstruction or gangrene: Secondary | ICD-10-CM | POA: Insufficient documentation

## 2013-07-16 DIAGNOSIS — E785 Hyperlipidemia, unspecified: Secondary | ICD-10-CM

## 2013-07-16 DIAGNOSIS — K224 Dyskinesia of esophagus: Secondary | ICD-10-CM | POA: Insufficient documentation

## 2013-07-16 DIAGNOSIS — F329 Major depressive disorder, single episode, unspecified: Secondary | ICD-10-CM | POA: Insufficient documentation

## 2013-07-16 DIAGNOSIS — I10 Essential (primary) hypertension: Secondary | ICD-10-CM | POA: Insufficient documentation

## 2013-07-25 ENCOUNTER — Encounter: Payer: 59 | Attending: General Surgery | Admitting: Dietician

## 2013-07-25 ENCOUNTER — Encounter: Payer: Self-pay | Admitting: Dietician

## 2013-07-25 VITALS — Ht 65.0 in | Wt 242.3 lb

## 2013-07-25 DIAGNOSIS — E669 Obesity, unspecified: Secondary | ICD-10-CM | POA: Insufficient documentation

## 2013-07-25 DIAGNOSIS — Z713 Dietary counseling and surveillance: Secondary | ICD-10-CM | POA: Insufficient documentation

## 2013-07-25 NOTE — Patient Instructions (Addendum)
Patient to call the [facility] to enroll in Pre-Op and Post-Op Nutrition Education when surgery date is scheduled. 

## 2013-07-25 NOTE — Progress Notes (Signed)
  Pre-Op Assessment Visit:  Pre-Operative RYGB Surgery  Medical Nutrition Therapy:  Appt start time: 1100   End time:  1200.  Patient was seen on 07/25/2013 for Pre-Operative RYGB Nutrition Assessment. Assessment and letter of approval faxed to [facility] Bariatric Surgery Program coordinator on 07/25/2013.   Handouts given during visit include:  Pre-Op Goals Bariatric Surgery Protein Shakes  Patient to call the [facility] to enroll in Pre-Op and Post-Op Nutrition Education when surgery date is scheduled.

## 2013-08-26 ENCOUNTER — Other Ambulatory Visit (INDEPENDENT_AMBULATORY_CARE_PROVIDER_SITE_OTHER): Payer: Self-pay

## 2013-08-26 DIAGNOSIS — Z01818 Encounter for other preprocedural examination: Secondary | ICD-10-CM

## 2013-08-28 ENCOUNTER — Telehealth (INDEPENDENT_AMBULATORY_CARE_PROVIDER_SITE_OTHER): Payer: Self-pay

## 2013-08-28 NOTE — Telephone Encounter (Signed)
Spoke to patient to make aware that I will be mailing labs slips for Nicotine Testing.  Patient aware and will go to [employer].  Patient advised to continue not smoking and reminded that patient will be retested the morning of surgery to confirm patient still not smoking.  Patient verbalized understanding and agrees with plan as above.

## 2013-08-28 NOTE — Telephone Encounter (Signed)
Message copied by Maryan Puls on Wed Aug 28, 2013  3:39 PM ------      Message from: Lodema Pilot      Created: Fri Aug 23, 2013 11:51 AM       This patient says that she has not smoked since I saw her.  Will you please set her up for nicotine test? ------

## 2013-09-03 LAB — NICOTINE/COTININE METABOLITES: Cotinine: <10 ng/mL

## 2013-09-05 ENCOUNTER — Encounter: Payer: 59 | Attending: General Surgery

## 2013-09-05 DIAGNOSIS — E669 Obesity, unspecified: Secondary | ICD-10-CM | POA: Insufficient documentation

## 2013-09-05 DIAGNOSIS — Z713 Dietary counseling and surveillance: Secondary | ICD-10-CM | POA: Insufficient documentation

## 2013-09-06 NOTE — Progress Notes (Signed)
Pre-Operative Nursing and Nutrition Class:  Appt start time: 1730   End time:  1930. Patient was seen on 09/05/13 for Pre-Operative Bariatric Surgery Education at the [facility].  Surgery date: 10/01/13 Surgery type: RYGB The following the learning objectives were met by the patient during this course: Identify Pre-Op Dietary Goals and will begin 2 weeks pre-operatively Identify appropriate sources of fluids and proteins   State protein recommendations and appropriate sources pre and post-operatively Identify Post-Operative Dietary Goals and will follow for 2 weeks post-operatively Identify appropriate multivitamin and calcium sources Describe the need for physical activity post-operatively and will follow MD recommendations State when to call healthcare provider regarding medication questions or post-operative complications Handouts given during class include: Pre-Op Bariatric Surgery Diet Handout Protein Shake Handout Post-Op Bariatric Surgery Nutrition Handout BEL[facility] Program Information Flyer Support Group Information Flyer [facility] Bariatric Supplements Price List Follow-Up Plan: Patient will follow-up at [facility] 2 weeks post operatively for diet advancement per MD. Patient Instructions Follow:  Pre-Op Diet per MD 2 weeks prior to surgery   Phase 2- Liquids (clear/full) 2 weeks after surgery   Vitamin/Mineral/Calcium guidelines for purchasing bariatric supplements   Exercise guidelines pre and post-op per MD Follow-up at [facility] in 2 weeks post-op for diet advancement. Contact [facility] at [phone] or Skip [employer] [phone] [email or web address] with any questions or concerns Follow-up and Disposition    Return in about 5 weeks (10/15/13) for 2 Week Post-Op Class.

## 2013-09-12 ENCOUNTER — Other Ambulatory Visit (INDEPENDENT_AMBULATORY_CARE_PROVIDER_SITE_OTHER): Payer: Self-pay | Admitting: General Surgery

## 2013-09-12 ENCOUNTER — Other Ambulatory Visit: Payer: Self-pay

## 2013-09-23 ENCOUNTER — Encounter (HOSPITAL_COMMUNITY): Payer: Self-pay | Admitting: Pharmacy Technician

## 2013-09-24 ENCOUNTER — Other Ambulatory Visit (HOSPITAL_COMMUNITY): Payer: Self-pay | Admitting: *Deleted

## 2013-09-25 ENCOUNTER — Encounter (HOSPITAL_COMMUNITY): Payer: Self-pay

## 2013-09-25 ENCOUNTER — Encounter (HOSPITAL_COMMUNITY)
Admission: RE | Admit: 2013-09-25 | Discharge: 2013-09-25 | Disposition: A | Discharge status: Home / Self Care | Payer: 59 | Source: Ambulatory Visit | Attending: General Surgery | Admitting: General Surgery

## 2013-09-25 ENCOUNTER — Ambulatory Visit (HOSPITAL_COMMUNITY)
Admission: RE | Admit: 2013-09-25 | Discharge: 2013-09-25 | Disposition: A | Discharge status: Home / Self Care | Payer: 59 | Source: Ambulatory Visit | Attending: General Surgery | Admitting: General Surgery

## 2013-09-25 DIAGNOSIS — Z01818 Encounter for other preprocedural examination: Secondary | ICD-10-CM | POA: Insufficient documentation

## 2013-09-25 DIAGNOSIS — I10 Essential (primary) hypertension: Secondary | ICD-10-CM | POA: Insufficient documentation

## 2013-09-25 DIAGNOSIS — Z01812 Encounter for preprocedural laboratory examination: Secondary | ICD-10-CM | POA: Insufficient documentation

## 2013-09-25 HISTORY — DX: Depression, unspecified: F32.A

## 2013-09-25 HISTORY — DX: Major depressive disorder, single episode, unspecified: F32.9

## 2013-09-25 LAB — COMPREHENSIVE METABOLIC PANEL
ALT: 14 U/L (ref 0–35)
AST: 12 U/L (ref 0–37)
Albumin: 4.3 g/dL (ref 3.5–5.2)
Alkaline Phosphatase: 60 U/L (ref 39–117)
BUN: 25 mg/dL — ABNORMAL HIGH (ref 6–23)
CO2: 23 mEq/L (ref 19–32)
Calcium: 10.5 mg/dL (ref 8.4–10.5)
Chloride: 97 mEq/L (ref 96–112)
Creatinine, Ser: 1.01 mg/dL (ref 0.50–1.10)
GFR calc Af Amer: 77 mL/min — ABNORMAL LOW (ref >90)
GFR calc non Af Amer: 67 mL/min — ABNORMAL LOW (ref >90)
Glucose, Bld: 88 mg/dL (ref 70–99)
Potassium: 3.9 mEq/L (ref 3.5–5.1)
Sodium: 133 mEq/L — ABNORMAL LOW (ref 135–145)
Total Bilirubin: 0.2 mg/dL — ABNORMAL LOW (ref 0.3–1.2)
Total Protein: 7.9 g/dL (ref 6.0–8.3)

## 2013-09-25 LAB — CBC WITH DIFFERENTIAL/PLATELET
Basophils Absolute: 0 10*3/uL (ref 0.0–0.1)
Basophils Relative: 0 % (ref 0–1)
Eosinophils Absolute: 0.2 10*3/uL (ref 0.0–0.7)
Eosinophils Relative: 1 % (ref 0–5)
HCT: 41 % (ref 36.0–46.0)
Hemoglobin: 14.5 g/dL (ref 12.0–15.0)
Lymphocytes Relative: 23 % (ref 12–46)
Lymphs Abs: 3 10*3/uL (ref 0.7–4.0)
MCH: 32.9 pg (ref 26.0–34.0)
MCHC: 35.4 g/dL (ref 30.0–36.0)
MCV: 93 fL (ref 78.0–100.0)
Monocytes Absolute: 0.9 10*3/uL (ref 0.1–1.0)
Monocytes Relative: 7 % (ref 3–12)
Neutro Abs: 8.9 10*3/uL — ABNORMAL HIGH (ref 1.7–7.7)
Neutrophils Relative %: 68 % (ref 43–77)
Platelets: 297 10*3/uL (ref 150–400)
RBC: 4.41 MIL/uL (ref 3.87–5.11)
RDW: 12 % (ref 11.5–15.5)
WBC: 13 10*3/uL — ABNORMAL HIGH (ref 4.0–10.5)

## 2013-09-25 LAB — HCG, SERUM, QUALITATIVE: Preg, Serum: NEGATIVE

## 2013-09-25 NOTE — Patient Instructions (Signed)
Kimberly Stanley  09/25/2013                           YOUR PROCEDURE IS SCHEDULED ON:  10/01/13               PLEASE REPORT TO SHORT STAY CENTER AT : 10:45 AM               CALL THIS NUMBER IF ANY PROBLEMS THE DAY OF SURGERY :               [phone]                      REMEMBER:   Do not eat food or drink liquids AFTER MIDNIGHT  May have clear liquids UNTIL 6 HOURS BEFORE SURGERY (7:15 AM)  Clear liquids include soda, tea, black coffee, apple or grape juice, broth.  Take these medicines the morning of surgery with A SIP OF WATER: BUSPAR / ZYRTEC / FLUOXETINE / LEVOTHYROXINE   Do not wear jewelry, make-up   Do not wear lotions, powders, or perfumes.   Do not shave legs or underarms 12 hrs. before surgery (men may shave face)  Do not bring valuables to the hospital.  Contacts, dentures or bridgework may not be worn into surgery.  Leave suitcase in the car. After surgery it may be brought to your room.  For patients admitted to the hospital more than one night, checkout time is 11:00                          The day of discharge.   Patients discharged the day of surgery will not be allowed to drive home                             If going home same day of surgery, must have someone stay with you first                           24 hrs at home and arrange for some one to drive you home from hospital.    Special Instructions:   Please read over the following fact sheets that you were given:                               1. [place] PREPARING FOR SURGERY SHEET                                                X_____________________________________________________________________        Failure to follow these instructions may result in cancellation of your surgery

## 2013-09-27 ENCOUNTER — Ambulatory Visit (INDEPENDENT_AMBULATORY_CARE_PROVIDER_SITE_OTHER): Payer: 59 | Admitting: General Surgery

## 2013-09-27 ENCOUNTER — Encounter (INDEPENDENT_AMBULATORY_CARE_PROVIDER_SITE_OTHER): Payer: Self-pay | Admitting: General Surgery

## 2013-09-27 DIAGNOSIS — Z6839 Body mass index (BMI) 39.0-39.9, adult: Secondary | ICD-10-CM

## 2013-09-27 NOTE — Progress Notes (Signed)
Patient ID: Kimberly Stanley, female   DOB: 08/21/1966, 46 y.o.   MRN: [id number]  Chief Complaint  Patient presents with  . Bariatric Pre-op    RNY    HPI Kimberly Stanley is a 46 y.o. female.   HPI She comes in today for her preoperative surgery evaluation in preparation for laparoscopic Roux-en-Y gastric bypass. She has a BMI of 39 with obesity related comorbidities of hypertension, hyperlipidemia, hypothyroidism, depression. She has failed medical weight loss attempts and desires gastric bypass for durable weight loss. She has not smoked since her last visit and has been cleared by nutrition and psychology. She has started her preoperative diet and has lost about 9 pounds. She continues to exercise and seems motivated and interested in gastric bypass. Past Medical History  Diagnosis Date  . Thyroid disease   . Hypertension   . Hypothyroidism   . Depression     Past Surgical History  Procedure Laterality Date  . Cholecystectomy    . Dilation and curettage of uterus    . Tubal ligation      Family History  Problem Relation Age of Onset  . Cancer Mother     breast  . Cancer Paternal Uncle     panctratic  . Arthritis Other   . Cancer Other   . Hyperlipidemia Other   . Hypertension Other   . Obesity Other     Social History History  Substance Use Topics  . Smoking status: Former Smoker -- 0.50 packs/day for 15 years    Types: Cigarettes    Quit date: 07/05/2013  . Smokeless tobacco: Never Used  . Alcohol Use: No    No Known Allergies  Current Outpatient Prescriptions  Medication Sig Dispense Refill  . busPIRone (BUSPAR) 15 MG tablet Take 15 mg by mouth 2 (two) times daily.       . calcium carbonate (OS-CAL) 600 MG TABS tablet Take 600 mg by mouth daily with breakfast.      . cetirizine (ZYRTEC) 10 MG tablet Take 10 mg by mouth daily.      . Cholecalciferol (VITAMIN D) 2000 UNITS tablet Take 2,000 Units by mouth daily.      . FLUoxetine (PROZAC) 40 MG capsule Take  40 mg by mouth every morning.       . lamoTRIgine (LAMICTAL) 150 MG tablet Take 150 mg by mouth at bedtime.       . levothyroxine (SYNTHROID, LEVOTHROID) 112 MCG tablet Take 112 mcg by mouth daily before breakfast.      . traZODone (DESYREL) 50 MG tablet Take 50 mg by mouth at bedtime.       . valsartan-hydrochlorothiazide (DIOVAN-HCT) 320-12.5 MG per tablet Take 1 tablet by mouth every morning.       . aspirin 81 MG tablet Take 81 mg by mouth daily.       No current facility-administered medications for this visit.    Review of Systems Review of Systems All other review of systems negative or noncontributory except as stated in the HPI  Blood pressure 118/68, pulse 68, temperature 98.3 F (36.8 C), resp. rate 18, height 5' 5" (1.651 m), weight 235 lb 6.4 oz (106.777 kg).  Physical Exam Physical Exam Physical Exam  Nursing note and vitals reviewed. Constitutional: She is oriented to person, place, and time. She appears well-developed and well-nourished. No distress.  HENT:  Head: Normocephalic and atraumatic.  Mouth/Throat: No oropharyngeal exudate.  Eyes: Conjunctivae and EOM are normal. Pupils are equal, round,   and reactive to light. Right eye exhibits no discharge. Left eye exhibits no discharge. No scleral icterus.  Neck: Normal range of motion. Neck supple. No tracheal deviation present.  Cardiovascular: Normal rate, regular rhythm, normal heart sounds and intact distal pulses.   Pulmonary/Chest: Effort normal and breath sounds normal. No stridor. No respiratory distress. She has no wheezes.  Abdominal: Soft. Bowel sounds are normal. She exhibits no distension and no mass. There is no tenderness. There is no rebound and no guarding.  Musculoskeletal: Normal range of motion. She exhibits no edema and no tenderness.  Neurological: She is alert and oriented to person, place, and time.  Skin: Skin is warm and dry. No rash noted. She is not diaphoretic. No erythema. No pallor.   Psychiatric: She has a normal mood and affect. Her behavior is normal. Judgment and thought content normal.    Data Reviewed   Assessment    Morbid obesity with a BMI of 39 and obesity related comorbidities of hypertension, hyperlipidemia, depression, and hypothyroidism She comes in today for her preoperative surgery evaluation and says that she has been smoke-free basically since we last met. She has completed her preoperative surgery evaluation and remains interested in Roux-en-Y gastric bypass. We discussed the procedure and its risks and the perioperative course and expectations. The risks of infection, bleeding, pain, scarring, weight regain, too little or too much weight loss, vitamin deficiencies and need for lifelong vitamin supplementation, hair loss, need for protein supplementation, leaks, stricture, reflux, food intolerance, need for reoperation , need for open surgery, injury to spleen or surrounding structures, DVT's, PE, and death again discussed with the patient and the patient expressed understanding and desires to proceed with laparoscopic RYGB, possible open, intraoperative endoscopy.      Plan    We will plan for arthroscopic Roux-en-Y gastric bypass next week        Kimberly Stanley 09/27/2013, 11:39 AM    

## 2013-09-27 NOTE — Progress Notes (Signed)
Pt notified of time change to 7:15 am - instructed to arrive at short stay at 5:15 am on 10/01/13

## 2013-09-30 NOTE — Anesthesia Preprocedure Evaluation (Addendum)
Anesthesia Evaluation  Patient identified by MRN, date of birth, ID band Patient awake    Reviewed: Allergy & Precautions, H&P , NPO status , Patient's Chart, lab work & pertinent test results  Airway Mallampati: II TM Distance: >3 FB Neck ROM: full    Dental no notable dental hx. (+) Teeth Intact and Dental Advisory Given   Pulmonary neg pulmonary ROS, former smoker,  breath sounds clear to auscultation  Pulmonary exam normal       Cardiovascular Exercise Tolerance: Good hypertension, Pt. on medications Rhythm:regular Rate:Normal     Neuro/Psych negative neurological ROS  negative psych ROS   GI/Hepatic negative GI ROS, Neg liver ROS,   Endo/Other  Hypothyroidism Morbid obesity  Renal/GU negative Renal ROS  negative genitourinary   Musculoskeletal   Abdominal (+) + obese,   Peds  Hematology negative hematology ROS (+)   Anesthesia Other Findings   Reproductive/Obstetrics negative OB ROS                          Anesthesia Physical Anesthesia Plan  ASA: III  Anesthesia Plan: General   Post-op Pain Management:    Induction: Intravenous  Airway Management Planned: Oral ETT  Additional Equipment:   Intra-op Plan:   Post-operative Plan: Extubation in OR  Informed Consent: I have reviewed the patients History and Physical, chart, labs and discussed the procedure including the risks, benefits and alternatives for the proposed anesthesia with the patient or authorized representative who has indicated his/her understanding and acceptance.   Dental Advisory Given  Plan Discussed with: CRNA and Surgeon  Anesthesia Plan Comments:         Anesthesia Quick Evaluation

## 2013-10-01 ENCOUNTER — Encounter (HOSPITAL_COMMUNITY)
Admission: RE | Disposition: A | Discharge status: Home / Self Care | Payer: Self-pay | Source: Ambulatory Visit | Attending: General Surgery

## 2013-10-01 ENCOUNTER — Encounter (HOSPITAL_COMMUNITY): Payer: Self-pay | Admitting: *Deleted

## 2013-10-01 ENCOUNTER — Inpatient Hospital Stay (HOSPITAL_COMMUNITY)
Admission: RE | Admit: 2013-10-01 | Discharge: 2013-10-03 | DRG: 621 | Disposition: A | Discharge status: Home / Self Care | Payer: 59 | Source: Ambulatory Visit | Attending: General Surgery | Admitting: General Surgery

## 2013-10-01 ENCOUNTER — Encounter (HOSPITAL_COMMUNITY): Payer: 59 | Admitting: Anesthesiology

## 2013-10-01 ENCOUNTER — Inpatient Hospital Stay (HOSPITAL_COMMUNITY): Payer: 59 | Admitting: Anesthesiology

## 2013-10-01 DIAGNOSIS — E039 Hypothyroidism, unspecified: Secondary | ICD-10-CM | POA: Diagnosis present

## 2013-10-01 DIAGNOSIS — I10 Essential (primary) hypertension: Secondary | ICD-10-CM

## 2013-10-01 DIAGNOSIS — E785 Hyperlipidemia, unspecified: Secondary | ICD-10-CM

## 2013-10-01 DIAGNOSIS — K449 Diaphragmatic hernia without obstruction or gangrene: Secondary | ICD-10-CM | POA: Diagnosis present

## 2013-10-01 DIAGNOSIS — Z7982 Long term (current) use of aspirin: Secondary | ICD-10-CM

## 2013-10-01 DIAGNOSIS — Z87891 Personal history of nicotine dependence: Secondary | ICD-10-CM

## 2013-10-01 DIAGNOSIS — R110 Nausea: Secondary | ICD-10-CM | POA: Diagnosis not present

## 2013-10-01 DIAGNOSIS — Z6839 Body mass index (BMI) 39.0-39.9, adult: Secondary | ICD-10-CM

## 2013-10-01 HISTORY — PX: GASTRIC ROUX-EN-Y: SHX5262

## 2013-10-01 SURGERY — LAPAROSCOPIC ROUX-EN-Y GASTRIC BYPASS WITH UPPER ENDOSCOPY
Anesthesia: General | Site: Abdomen | Wound class: Clean Contaminated

## 2013-10-01 MED ORDER — FENTANYL CITRATE 0.05 MG/ML IJ SOLN
INTRAMUSCULAR | Status: AC
  Filled 2013-10-01: qty 5

## 2013-10-01 MED ORDER — ACETAMINOPHEN 160 MG/5ML PO SOLN
650.0000 mg | ORAL | Status: DC | PRN
  Administered 2013-10-02 – 2013-10-03 (×2): 650 mg via ORAL
  Filled 2013-10-01 (×2): qty 20.3

## 2013-10-01 MED ORDER — NEOSTIGMINE METHYLSULFATE 1 MG/ML IJ SOLN
INTRAMUSCULAR | Status: AC
  Filled 2013-10-01: qty 10

## 2013-10-01 MED ORDER — ENOXAPARIN SODIUM 40 MG/0.4ML ~~LOC~~ SOLN
40.0000 mg | Freq: Two times a day (BID) | SUBCUTANEOUS | Status: DC
  Administered 2013-10-02 – 2013-10-03 (×2): 40 mg via SUBCUTANEOUS
  Filled 2013-10-01 (×6): qty 0.4

## 2013-10-01 MED ORDER — SODIUM CHLORIDE 0.9 % IV SOLN
1.0000 g | Freq: Once | INTRAVENOUS | Status: AC
  Administered 2013-10-01: 1 g via INTRAVENOUS

## 2013-10-01 MED ORDER — LACTATED RINGERS IR SOLN
Status: DC | PRN
  Administered 2013-10-01: 1000 mL

## 2013-10-01 MED ORDER — DEXAMETHASONE SODIUM PHOSPHATE 10 MG/ML IJ SOLN
INTRAMUSCULAR | Status: DC | PRN
  Administered 2013-10-01: 10 mg via INTRAVENOUS

## 2013-10-01 MED ORDER — LEVOTHYROXINE SODIUM 112 MCG PO TABS
112.0000 ug | ORAL_TABLET | Freq: Every day | ORAL | Status: DC
  Administered 2013-10-02 – 2013-10-03 (×2): 112 ug via ORAL
  Filled 2013-10-01 (×3): qty 1

## 2013-10-01 MED ORDER — LIDOCAINE-EPINEPHRINE 0.5 %-1:200000 IJ SOLN
INTRAMUSCULAR | Status: AC
  Filled 2013-10-01: qty 1

## 2013-10-01 MED ORDER — LIDOCAINE HCL (CARDIAC) 20 MG/ML IV SOLN
INTRAVENOUS | Status: DC | PRN
  Administered 2013-10-01: 100 mg via INTRAVENOUS

## 2013-10-01 MED ORDER — LAMOTRIGINE 150 MG PO TABS
150.0000 mg | ORAL_TABLET | Freq: Every day | ORAL | Status: DC
  Administered 2013-10-01 – 2013-10-02 (×2): 150 mg via ORAL
  Filled 2013-10-01 (×3): qty 1

## 2013-10-01 MED ORDER — OXYCODONE HCL 5 MG/5ML PO SOLN
5.0000 mg | ORAL | Status: DC | PRN
  Administered 2013-10-02 (×2): 10 mg via ORAL
  Filled 2013-10-01 (×2): qty 10

## 2013-10-01 MED ORDER — GLYCOPYRROLATE 0.2 MG/ML IJ SOLN
INTRAMUSCULAR | Status: DC | PRN
  Administered 2013-10-01: .1 mg via INTRAVENOUS
  Administered 2013-10-01: .4 mg via INTRAVENOUS
  Administered 2013-10-01: .1 mg via INTRAVENOUS

## 2013-10-01 MED ORDER — LIDOCAINE-EPINEPHRINE 1 %-1:100000 IJ SOLN
INTRAMUSCULAR | Status: DC | PRN
  Administered 2013-10-01: 25 mL

## 2013-10-01 MED ORDER — HYDROMORPHONE HCL PF 1 MG/ML IJ SOLN
0.2500 mg | INTRAMUSCULAR | Status: DC | PRN
  Administered 2013-10-01 (×5): 0.5 mg via INTRAVENOUS

## 2013-10-01 MED ORDER — MIDAZOLAM HCL 5 MG/5ML IJ SOLN
INTRAMUSCULAR | Status: DC | PRN
  Administered 2013-10-01: 1.5 mg via INTRAVENOUS
  Administered 2013-10-01: .5 mg via INTRAVENOUS
  Administered 2013-10-01: 1 mg via INTRAVENOUS
  Administered 2013-10-01: .5 mg via INTRAVENOUS

## 2013-10-01 MED ORDER — HYDROMORPHONE HCL PF 1 MG/ML IJ SOLN
0.2500 mg | INTRAMUSCULAR | Status: DC | PRN
  Administered 2013-10-01: 0.5 mg via INTRAVENOUS

## 2013-10-01 MED ORDER — NEOSTIGMINE METHYLSULFATE 1 MG/ML IJ SOLN
INTRAMUSCULAR | Status: DC | PRN
  Administered 2013-10-01: 3 mg via INTRAVENOUS

## 2013-10-01 MED ORDER — UNJURY VANILLA POWDER: 2.0000 [oz_av] | Freq: Four times a day (QID) | ORAL | Status: DC

## 2013-10-01 MED ORDER — CHLORHEXIDINE GLUCONATE 0.12 % MT SOLN
15.0000 mL | Freq: Two times a day (BID) | OROMUCOSAL | Status: DC
  Administered 2013-10-01 – 2013-10-03 (×4): 15 mL via OROMUCOSAL
  Filled 2013-10-01 (×7): qty 15

## 2013-10-01 MED ORDER — FLUOXETINE HCL 20 MG PO CAPS
40.0000 mg | ORAL_CAPSULE | Freq: Every morning | ORAL | Status: DC
Start: 2013-10-02 — End: 2013-10-03
  Administered 2013-10-02 – 2013-10-03 (×2): 40 mg via ORAL
  Filled 2013-10-01 (×2): qty 2

## 2013-10-01 MED ORDER — BUSPIRONE HCL 15 MG PO TABS
15.0000 mg | ORAL_TABLET | Freq: Two times a day (BID) | ORAL | Status: DC
  Administered 2013-10-02 – 2013-10-03 (×3): 15 mg via ORAL
  Filled 2013-10-01 (×4): qty 1

## 2013-10-01 MED ORDER — GLYCOPYRROLATE 0.2 MG/ML IJ SOLN
INTRAMUSCULAR | Status: AC
  Filled 2013-10-01: qty 3

## 2013-10-01 MED ORDER — LIDOCAINE HCL (CARDIAC) 20 MG/ML IV SOLN
INTRAVENOUS | Status: AC
  Filled 2013-10-01: qty 5

## 2013-10-01 MED ORDER — HEPARIN SODIUM (PORCINE) 5000 UNIT/ML IJ SOLN
5000.0000 [IU] | Freq: Once | INTRAMUSCULAR | Status: AC
  Administered 2013-10-01: 5000 [IU] via SUBCUTANEOUS
  Filled 2013-10-01: qty 1

## 2013-10-01 MED ORDER — HYDROMORPHONE HCL PF 1 MG/ML IJ SOLN: 0.5000 mg | INTRAMUSCULAR | Status: DC | PRN

## 2013-10-01 MED ORDER — PANTOPRAZOLE SODIUM 40 MG IV SOLR
40.0000 mg | INTRAVENOUS | Status: DC
  Administered 2013-10-01 – 2013-10-02 (×2): 40 mg via INTRAVENOUS
  Filled 2013-10-01 (×3): qty 40

## 2013-10-01 MED ORDER — TRAZODONE HCL 50 MG PO TABS
50.0000 mg | ORAL_TABLET | Freq: Every day | ORAL | Status: DC
  Administered 2013-10-01 – 2013-10-02 (×2): 50 mg via ORAL
  Filled 2013-10-01 (×3): qty 1

## 2013-10-01 MED ORDER — HYDROMORPHONE HCL PF 1 MG/ML IJ SOLN
INTRAMUSCULAR | Status: AC
  Filled 2013-10-01: qty 1

## 2013-10-01 MED ORDER — PROPOFOL 10 MG/ML IV BOLUS
INTRAVENOUS | Status: AC
  Filled 2013-10-01: qty 20

## 2013-10-01 MED ORDER — PROPOFOL 10 MG/ML IV BOLUS
INTRAVENOUS | Status: DC | PRN
  Administered 2013-10-01: 180 mg via INTRAVENOUS

## 2013-10-01 MED ORDER — ONDANSETRON HCL 4 MG/2ML IJ SOLN
INTRAMUSCULAR | Status: DC | PRN
  Administered 2013-10-01: 4 mg via INTRAVENOUS

## 2013-10-01 MED ORDER — KETOROLAC TROMETHAMINE 30 MG/ML IJ SOLN
30.0000 mg | Freq: Four times a day (QID) | INTRAMUSCULAR | Status: AC | PRN
  Administered 2013-10-01: 30 mg via INTRAVENOUS

## 2013-10-01 MED ORDER — UNJURY CHOCOLATE CLASSIC POWDER
2.0000 [oz_av] | Freq: Four times a day (QID) | ORAL | Status: DC
  Administered 2013-10-03: 2 [oz_av] via ORAL

## 2013-10-01 MED ORDER — LACTATED RINGERS IV SOLN
INTRAVENOUS | Status: DC | PRN
  Administered 2013-10-01 (×2): via INTRAVENOUS

## 2013-10-01 MED ORDER — KCL IN DEXTROSE-NACL 20-5-0.45 MEQ/L-%-% IV SOLN
INTRAVENOUS | Status: DC
  Administered 2013-10-01 – 2013-10-03 (×5): via INTRAVENOUS
  Filled 2013-10-01 (×8): qty 1000

## 2013-10-01 MED ORDER — SODIUM CHLORIDE 0.9 % IJ SOLN
INTRAMUSCULAR | Status: AC
  Filled 2013-10-01: qty 10

## 2013-10-01 MED ORDER — LACTATED RINGERS IV SOLN: INTRAVENOUS | Status: DC

## 2013-10-01 MED ORDER — FENTANYL CITRATE 0.05 MG/ML IJ SOLN
INTRAMUSCULAR | Status: DC | PRN
  Administered 2013-10-01 (×8): 50 ug via INTRAVENOUS

## 2013-10-01 MED ORDER — BUPIVACAINE HCL (PF) 0.25 % IJ SOLN
INTRAMUSCULAR | Status: AC
  Filled 2013-10-01: qty 30

## 2013-10-01 MED ORDER — DEXAMETHASONE SODIUM PHOSPHATE 10 MG/ML IJ SOLN
INTRAMUSCULAR | Status: AC
  Filled 2013-10-01: qty 1

## 2013-10-01 MED ORDER — SUCCINYLCHOLINE CHLORIDE 20 MG/ML IJ SOLN
INTRAMUSCULAR | Status: AC
  Filled 2013-10-01: qty 2

## 2013-10-01 MED ORDER — BIOTENE DRY MOUTH MT LIQD
15.0000 mL | Freq: Two times a day (BID) | OROMUCOSAL | Status: DC
  Administered 2013-10-02: 15 mL via OROMUCOSAL

## 2013-10-01 MED ORDER — SODIUM CHLORIDE 0.9 % IV SOLN
INTRAVENOUS | Status: AC
  Filled 2013-10-01: qty 1

## 2013-10-01 MED ORDER — CISATRACURIUM BESYLATE (PF) 10 MG/5ML IV SOLN: INTRAVENOUS | Status: DC | PRN

## 2013-10-01 MED ORDER — BUPIVACAINE HCL 0.25 % IJ SOLN
INTRAMUSCULAR | Status: DC | PRN
  Administered 2013-10-01: 25 mL

## 2013-10-01 MED ORDER — CISATRACURIUM BESYLATE (PF) 10 MG/5ML IV SOLN
INTRAVENOUS | Status: DC | PRN
  Administered 2013-10-01 (×2): 2 mg via INTRAVENOUS
  Administered 2013-10-01: 10 mg via INTRAVENOUS

## 2013-10-01 MED ORDER — UNJURY CHICKEN SOUP POWDER: 2.0000 [oz_av] | Freq: Four times a day (QID) | ORAL | Status: DC

## 2013-10-01 MED ORDER — ONDANSETRON HCL 4 MG/2ML IJ SOLN
4.0000 mg | INTRAMUSCULAR | Status: DC | PRN
  Administered 2013-10-01 – 2013-10-02 (×2): 4 mg via INTRAVENOUS
  Filled 2013-10-01 (×2): qty 2

## 2013-10-01 MED ORDER — SUCCINYLCHOLINE CHLORIDE 20 MG/ML IJ SOLN
INTRAMUSCULAR | Status: DC | PRN
  Administered 2013-10-01: 140 mg via INTRAVENOUS

## 2013-10-01 MED ORDER — MIDAZOLAM HCL 2 MG/2ML IJ SOLN
INTRAMUSCULAR | Status: AC
  Filled 2013-10-01: qty 2

## 2013-10-01 MED ORDER — ONDANSETRON HCL 4 MG/2ML IJ SOLN
INTRAMUSCULAR | Status: AC
  Filled 2013-10-01: qty 2

## 2013-10-01 MED ORDER — MORPHINE SULFATE 2 MG/ML IJ SOLN
2.0000 mg | INTRAMUSCULAR | Status: DC | PRN
  Administered 2013-10-01: 2 mg via INTRAVENOUS
  Administered 2013-10-01 (×2): 4 mg via INTRAVENOUS
  Administered 2013-10-01 (×2): 2 mg via INTRAVENOUS
  Administered 2013-10-02: 4 mg via INTRAVENOUS
  Administered 2013-10-02: 2 mg via INTRAVENOUS
  Filled 2013-10-01: qty 2
  Filled 2013-10-01: qty 1
  Filled 2013-10-01: qty 2
  Filled 2013-10-01: qty 1
  Filled 2013-10-01 (×3): qty 2

## 2013-10-01 MED ORDER — KETOROLAC TROMETHAMINE 30 MG/ML IJ SOLN
INTRAMUSCULAR | Status: AC
  Filled 2013-10-01: qty 1

## 2013-10-01 SURGICAL SUPPLY — 71 items
ADH SKN CLS APL DERMABOND .7 (GAUZE/BANDAGES/DRESSINGS) ×1
APL SRG 32X5 SNPLK LF DISP (MISCELLANEOUS)
APPLIER CLIP ROT 13.4 12 LRG (CLIP)
APPLIER CLIP UNV 5X34 EPIX (ENDOMECHANICALS) IMPLANT
APR CLP LRG 13.4X12 ROT 20 MLT (CLIP)
APR XCLPCLP 20M/L UNV 34X5 (ENDOMECHANICALS)
BAG SPEC RTRVL LRG 6X4 10 (ENDOMECHANICALS) ×1
BIOPATCH WHT 1IN DISK W/4.0 H (GAUZE/BANDAGES/DRESSINGS) ×1 IMPLANT
BLADE SURG 15 STRL LF DISP TIS (BLADE) ×1 IMPLANT
BLADE SURG 15 STRL SS (BLADE) ×2
CABLE HIGH FREQUENCY MONO STRZ (ELECTRODE) ×2 IMPLANT
CANISTER SUCTION 2500CC (MISCELLANEOUS) ×3 IMPLANT
CHLORAPREP W/TINT 26ML (MISCELLANEOUS) ×4 IMPLANT
CLIP APPLIE ROT 13.4 12 LRG (CLIP) IMPLANT
COVER PROBE U/S 5X48 (MISCELLANEOUS) ×2 IMPLANT
DERMABOND ADVANCED (GAUZE/BANDAGES/DRESSINGS) ×1
DERMABOND ADVANCED .7 DNX12 (GAUZE/BANDAGES/DRESSINGS) IMPLANT
DEVICE SUTURE ENDOST 10MM (ENDOMECHANICALS) ×3 IMPLANT
DISSECTOR BLUNT TIP ENDO 5MM (MISCELLANEOUS) IMPLANT
DRAIN CHANNEL 19F RND (DRAIN) ×2 IMPLANT
DRAPE CAMERA CLOSED 9X96 (DRAPES) ×2 IMPLANT
EVACUATOR SILICONE 100CC (DRAIN) ×2 IMPLANT
GAUZE SPONGE 4X4 16PLY XRAY LF (GAUZE/BANDAGES/DRESSINGS) ×2 IMPLANT
GLOVE SURG SS PI 7.5 STRL IVOR (GLOVE) ×6 IMPLANT
GOWN STRL REIN XL XLG (GOWN DISPOSABLE) ×8 IMPLANT
HOVERMATT SINGLE USE (MISCELLANEOUS) ×2 IMPLANT
KIT BASIN OR (CUSTOM PROCEDURE TRAY) ×2 IMPLANT
KIT GASTRIC LAVAGE 34FR ADT (SET/KITS/TRAYS/PACK) ×2 IMPLANT
MARKER SKIN DUAL TIP RULER LAB (MISCELLANEOUS) ×2 IMPLANT
NDL SPNL 22GX3.5 QUINCKE BK (NEEDLE) ×1 IMPLANT
NEEDLE SPNL 22GX3.5 QUINCKE BK (NEEDLE) ×2 IMPLANT
NS IRRIG 1000ML POUR BTL (IV SOLUTION) ×2 IMPLANT
PACK CARDIOVASCULAR III (CUSTOM PROCEDURE TRAY) ×2 IMPLANT
PENCIL BUTTON HOLSTER BLD 10FT (ELECTRODE) ×2 IMPLANT
POUCH SPECIMEN RETRIEVAL 10MM (ENDOMECHANICALS) ×2 IMPLANT
RELOAD BLUE (STAPLE) IMPLANT
RELOAD ENDO STITCH (ENDOMECHANICALS) ×14 IMPLANT
RELOAD ENDO STITCH 2.0 (ENDOMECHANICALS)
RELOAD GOLD (STAPLE) ×5 IMPLANT
RELOAD SUT SNGL STCH ABSRB 2-0 (ENDOMECHANICALS) IMPLANT
RELOAD SUT SNGL STCH BLK 2-0 (ENDOMECHANICALS) IMPLANT
RELOAD SUT TRIPLE-STITCH 2-0 (ENDOMECHANICALS) ×3 IMPLANT
RELOAD WHITE ECR60W (STAPLE) ×6 IMPLANT
SCISSORS LAP 5X45 EPIX DISP (ENDOMECHANICALS) ×1 IMPLANT
SEALANT SURGICAL APPL DUAL CAN (MISCELLANEOUS) IMPLANT
SET IRRIG TUBING LAPAROSCOPIC (IRRIGATION / IRRIGATOR) ×2 IMPLANT
SHEARS CURVED HARMONIC AC 45CM (MISCELLANEOUS) ×2 IMPLANT
SOLUTION ANTI FOG 6CC (MISCELLANEOUS) ×2 IMPLANT
SPONGE GAUZE 4X4 12PLY (GAUZE/BANDAGES/DRESSINGS) IMPLANT
STAPLE ECHEON FLEX 60 POW ENDO (STAPLE) ×2 IMPLANT
STAPLER CIRC 21 3.5 SULU (STAPLE) ×2 IMPLANT
STAPLER TRANS-ORAL 21MM DST (STAPLE) ×2 IMPLANT
STAPLER VISISTAT 35W (STAPLE) ×2 IMPLANT
STRIP PERI DRY VERITAS 60 (STAPLE) ×3 IMPLANT
SUT ETHILON 3 0 PS 1 (SUTURE) ×2 IMPLANT
SUT MNCRL AB 4-0 PS2 18 (SUTURE) ×4 IMPLANT
SUT RELOAD ENDO STITCH 2 48X1 (ENDOMECHANICALS)
SUT RELOAD ENDO STITCH 2.0 (ENDOMECHANICALS)
SUT VICRYL 0 UR6 27IN ABS (SUTURE) ×4 IMPLANT
SUTURE RELOAD END STTCH 2 48X1 (ENDOMECHANICALS) IMPLANT
SUTURE RELOAD ENDO STITCH 2.0 (ENDOMECHANICALS) IMPLANT
SYR 20CC LL (SYRINGE) ×4 IMPLANT
SYR 50ML LL SCALE MARK (SYRINGE) ×2 IMPLANT
TOWEL OR 17X26 10 PK STRL BLUE (TOWEL DISPOSABLE) ×2 IMPLANT
TRAY FOLEY CATH 14FRSI W/METER (CATHETERS) ×2 IMPLANT
TROCAR BLADELESS 15MM (ENDOMECHANICALS) ×2 IMPLANT
TROCAR BLADELESS OPT 5 100 (ENDOMECHANICALS) ×2 IMPLANT
TROCAR XCEL NON-BLD 5MMX100MML (ENDOMECHANICALS) ×4 IMPLANT
TUBING CONNECTING 10 (TUBING) ×2 IMPLANT
TUBING ENDO SMARTCAP (MISCELLANEOUS) ×2 IMPLANT
TUBING FILTER THERMOFLATOR (ELECTROSURGICAL) ×1 IMPLANT

## 2013-10-01 NOTE — Preoperative (Signed)
Beta Blockers   Reason not to administer Beta Blockers:Not Applicable 

## 2013-10-01 NOTE — Op Note (Signed)
ZAKYIA GAGAN [id number] 10/01/2013 07-07-1966   Preoperative diagnosis: Morbid obesity  Postoperative diagnosis: Same   Procedure: Upper endoscopy   Surgeon: Mary Sella. Litsy Epting M.D., FACS   Anesthesia: Gen.   Indications for procedure: 47 year old morbidly obese Caucasian female undergoing a laparoscopic Roux-en-Y gastric bypass by Dr. Nicanor Alcon and an upper endoscopy was requested at the conclusion of the procedure to evaluate the gastrojejunal anastomosis  Description of procedure: After we have completed the new gastrojejunostomy, I scrubbed out and obtained the Olympus endoscope. I gently placed endoscope in the patient's oropharynx and gently glided it down the esophagus without any difficulty under direct visualization. Once I was in the gastric pouch, I insufflated the pouch was air. The pouch was approximately 4.5 cm in size. I was able to cannulate and advanced the scope through the gastrojejunostomy. Dr.Layton had placed saline in the upper abdomen. Upon further insufflation of the gastric pouch there was no evidence of bubbles. Upon further inspection of the gastric pouch, the mucosa appeared normal. There is no evidence of any mucosal abnormality. The gastric pouch and Roux limb were decompressed. The width of the gastrojejunal anastomosis was at least 2.5 cm. The scope was withdrawn. The patient tolerated this portion of the procedure well. Please see Dr Delice Lesch operative note for details regarding the laparoscopic roux-en-y gastric bypass.  Mary Sella. Andrey Campanile, MD, FACS General, Bariatric, & Minimally Invasive Surgery [facility], [place]

## 2013-10-01 NOTE — Progress Notes (Signed)
Patient has attempted to void on own x3 without output. Bladder scan 204cc urine in bladder. Denies urinary discomfort thus far. Will monitor output.

## 2013-10-01 NOTE — Progress Notes (Signed)
Feels okay, vitals stable.  No complaints

## 2013-10-01 NOTE — Interval H&P Note (Signed)
History and Physical Interval Note:  10/01/2013 7:14 AM  Kimberly Stanley  has presented today for surgery, with the diagnosis of morbid obesity   The various methods of treatment have been discussed with the patient and family. After consideration of risks, benefits and other options for treatment, the patient has consented to  Procedure(s): LAPAROSCOPIC ROUX-EN-Y GASTRIC BYPASS WITH UPPER ENDOSCOPY (N/A) as a surgical intervention .  The patient's history has been reviewed, patient examined, no change in status, stable for surgery.  I have reviewed the patient's chart and labs.  Questions were answered to the patient's satisfaction.  I have seen and evaluated the patient in the preop area.  Risks of the procedure again discussed with the patient in lay terms and she expressed understanding and desires to proceed.  The risks of infection, bleeding, pain, scarring, weight regain, too little or too much weight loss, vitamin deficiencies and need for lifelong vitamin supplementation, hair loss, need for protein supplementation, leaks, stricture, reflux, food intolerance, need for reoperation , need for open surgery, injury to spleen or surrounding structures, DVT's, PE, and death again discussed with the patient and the patient expressed understanding and desires to proceed with laparoscopic RYGB, possible open, intraoperative endoscopy.    Lodema Pilot DAVID

## 2013-10-01 NOTE — Anesthesia Postprocedure Evaluation (Signed)
  Anesthesia Post-op Note  Patient: Kimberly Stanley  Procedure(s) Performed: Procedure(s) (LRB): LAPAROSCOPIC ROUX-EN-Y GASTRIC BYPASS with hiatal hernia repair, WITH UPPER ENDOSCOPY (N/A)  Patient Location: PACU  Anesthesia Type: General  Level of Consciousness: awake and alert   Airway and Oxygen Therapy: Patient Spontanous Breathing  Post-op Pain: mild  Post-op Assessment: Post-op Vital signs reviewed, Patient's Cardiovascular Status Stable, Respiratory Function Stable, Patent Airway and No signs of Nausea or vomiting  Last Vitals:  Filed Vitals:   10/01/13 1100  BP: 111/54  Pulse: 72  Temp:   Resp: 12    Post-op Vital Signs: stable   Complications: No apparent anesthesia complications

## 2013-10-01 NOTE — Brief Op Note (Signed)
10/01/2013  10:32 AM  PATIENT:  Kimberly Stanley  47 y.o. female  PRE-OPERATIVE DIAGNOSIS:  morbid obesity   POST-OPERATIVE DIAGNOSIS:  morbid obesity   PROCEDURE:  Procedure(s): LAPAROSCOPIC ROUX-EN-Y GASTRIC BYPASS WITH UPPER ENDOSCOPY (N/A) and hiatal hernia repair  SURGEON:  Surgeon(s) and Role:    * Lodema Pilot, DO - Primary    * Atilano Ina, MD - Assisting  PHYSICIAN ASSISTANT:   ASSISTANTS: wilson   ANESTHESIA:   general  EBL:  Total I/O In: 1000 [I.V.:1000] Out: 100 [Urine:100]  BLOOD ADMINISTERED:none  DRAINS: (15F) Jackson-Pratt drain(s) with closed bulb suction in the GJ anastamosis   LOCAL MEDICATIONS USED:  MARCAINE    and LIDOCAINE   SPECIMEN:  No Specimen  DISPOSITION OF SPECIMEN:  N/A  COUNTS:  YES  TOURNIQUET:  * No tourniquets in log *  DICTATION: .Other Dictation: Dictation Number [id number]  PLAN OF CARE: Admit to inpatient   PATIENT DISPOSITION:  PACU - hemodynamically stable.   Delay start of Pharmacological VTE agent (>24hrs) due to surgical blood loss or risk of bleeding: no

## 2013-10-01 NOTE — Transfer of Care (Signed)
Immediate Anesthesia Transfer of Care Note  Patient: Kimberly Stanley  Procedure(s) Performed: Procedure(s): LAPAROSCOPIC ROUX-EN-Y GASTRIC BYPASS with hiatal hernia repair, WITH UPPER ENDOSCOPY (N/A)  Patient Location: PACU  Anesthesia Type:General  Level of Consciousness: awake, oriented, patient cooperative, lethargic and responds to stimulation  Airway & Oxygen Therapy: Patient Spontanous Breathing and Patient connected to face mask oxygen  Post-op Assessment: Report given to PACU RN, Post -op Vital signs reviewed and stable and Patient moving all extremities  Post vital signs: Reviewed and stable  Complications: No apparent anesthesia complications

## 2013-10-01 NOTE — H&P (View-Only) (Signed)
Patient ID: Kimberly Stanley, female   DOB: 1966/08/03, 47 y.o.   MRN: [id number]  Chief Complaint  Patient presents with  . Bariatric Pre-op    RNY    HPI Kimberly Stanley is a 47 y.o. female.   HPI She comes in today for her preoperative surgery evaluation in preparation for laparoscopic Roux-en-Y gastric bypass. She has a BMI of 39 with obesity related comorbidities of hypertension, hyperlipidemia, hypothyroidism, depression. She has failed medical weight loss attempts and desires gastric bypass for durable weight loss. She has not smoked since her last visit and has been cleared by nutrition and psychology. She has started her preoperative diet and has lost about 9 pounds. She continues to exercise and seems motivated and interested in gastric bypass. Past Medical History  Diagnosis Date  . Thyroid disease   . Hypertension   . Hypothyroidism   . Depression     Past Surgical History  Procedure Laterality Date  . Cholecystectomy    . Dilation and curettage of uterus    . Tubal ligation      Family History  Problem Relation Age of Onset  . Cancer Mother     breast  . Cancer Paternal Uncle     panctratic  . Arthritis Other   . Cancer Other   . Hyperlipidemia Other   . Hypertension Other   . Obesity Other     Social History History  Substance Use Topics  . Smoking status: Former Smoker -- 0.50 packs/day for 15 years    Types: Cigarettes    Quit date: 07/05/2013  . Smokeless tobacco: Never Used  . Alcohol Use: No    No Known Allergies  Current Outpatient Prescriptions  Medication Sig Dispense Refill  . busPIRone (BUSPAR) 15 MG tablet Take 15 mg by mouth 2 (two) times daily.       . calcium carbonate (OS-CAL) 600 MG TABS tablet Take 600 mg by mouth daily with breakfast.      . cetirizine (ZYRTEC) 10 MG tablet Take 10 mg by mouth daily.      . Cholecalciferol (VITAMIN D) 2000 UNITS tablet Take 2,000 Units by mouth daily.      Marland Kitchen FLUoxetine (PROZAC) 40 MG capsule Take  40 mg by mouth every morning.       . lamoTRIgine (LAMICTAL) 150 MG tablet Take 150 mg by mouth at bedtime.       Marland Kitchen levothyroxine (SYNTHROID, LEVOTHROID) 112 MCG tablet Take 112 mcg by mouth daily before breakfast.      . traZODone (DESYREL) 50 MG tablet Take 50 mg by mouth at bedtime.       . valsartan-hydrochlorothiazide (DIOVAN-HCT) 320-12.5 MG per tablet Take 1 tablet by mouth every morning.       Marland Kitchen aspirin 81 MG tablet Take 81 mg by mouth daily.       No current facility-administered medications for this visit.    Review of Systems Review of Systems All other review of systems negative or noncontributory except as stated in the HPI  Blood pressure 118/68, pulse 68, temperature 98.3 F (36.8 C), resp. rate 18, height 5\' 5"  (1.651 m), weight 235 lb 6.4 oz (106.777 kg).  Physical Exam Physical Exam Physical Exam  Nursing note and vitals reviewed. Constitutional: She is oriented to person, place, and time. She appears well-developed and well-nourished. No distress.  HENT:  Head: Normocephalic and atraumatic.  Mouth/Throat: No oropharyngeal exudate.  Eyes: Conjunctivae and EOM are normal. Pupils are equal, round,  and reactive to light. Right eye exhibits no discharge. Left eye exhibits no discharge. No scleral icterus.  Neck: Normal range of motion. Neck supple. No tracheal deviation present.  Cardiovascular: Normal rate, regular rhythm, normal heart sounds and intact distal pulses.   Pulmonary/Chest: Effort normal and breath sounds normal. No stridor. No respiratory distress. She has no wheezes.  Abdominal: Soft. Bowel sounds are normal. She exhibits no distension and no mass. There is no tenderness. There is no rebound and no guarding.  Musculoskeletal: Normal range of motion. She exhibits no edema and no tenderness.  Neurological: She is alert and oriented to person, place, and time.  Skin: Skin is warm and dry. No rash noted. She is not diaphoretic. No erythema. No pallor.   Psychiatric: She has a normal mood and affect. Her behavior is normal. Judgment and thought content normal.    Data Reviewed   Assessment    Morbid obesity with a BMI of 39 and obesity related comorbidities of hypertension, hyperlipidemia, depression, and hypothyroidism She comes in today for her preoperative surgery evaluation and says that she has been smoke-free basically since we last met. She has completed her preoperative surgery evaluation and remains interested in Roux-en-Y gastric bypass. We discussed the procedure and its risks and the perioperative course and expectations. The risks of infection, bleeding, pain, scarring, weight regain, too little or too much weight loss, vitamin deficiencies and need for lifelong vitamin supplementation, hair loss, need for protein supplementation, leaks, stricture, reflux, food intolerance, need for reoperation , need for open surgery, injury to spleen or surrounding structures, DVT's, PE, and death again discussed with the patient and the patient expressed understanding and desires to proceed with laparoscopic RYGB, possible open, intraoperative endoscopy.      Plan    We will plan for arthroscopic Roux-en-Y gastric bypass next week        Jermari Tamargo DAVID 09/27/2013, 11:39 AM

## 2013-10-01 NOTE — Anesthesia Procedure Notes (Signed)
Procedure Name: Intubation Date/Time: 10/01/2013 7:30 AM Performed by: Edison Pace Pre-anesthesia Checklist: Emergency Drugs available, Patient identified, Timeout performed, Suction available and Patient being monitored Patient Re-evaluated:Patient Re-evaluated prior to inductionOxygen Delivery Method: Circle system utilized Preoxygenation: Pre-oxygenation with 100% oxygen Intubation Type: IV induction Ventilation: Mask ventilation without difficulty Laryngoscope Size: Mac and 4 Grade View: Grade II Tube type: Oral Tube size: 7.5 mm Number of attempts: 1 Airway Equipment and Method: Stylet Placement Confirmation: ETT inserted through vocal cords under direct vision,  positive ETCO2 and breath sounds checked- equal and bilateral Secured at: 21 cm Tube secured with: Tape Dental Injury: Teeth and Oropharynx as per pre-operative assessment

## 2013-10-01 NOTE — Op Note (Signed)
Kimberly Stanley, Kimberly Stanley                ACCOUNT NO.:  [id number]  MEDICAL RECORD NO.:  [id number]  LOCATION:  1534                         FACILITY:  [facility]  PHYSICIAN:  Lodema Pilot, MD       DATE OF BIRTH:  February 10, 1966  DATE OF PROCEDURE:  10/01/2013 DATE OF DISCHARGE:                              OPERATIVE REPORT   PROCEDURE:  Laparoscopic Roux-en-Y gastric bypass with intraoperative endoscopy and laparoscopic hiatal hernia repair.  PREOPERATIVE DIAGNOSIS:  Morbid obesity.  POSTOPERATIVE DIAGNOSIS:  Morbid obesity.  SURGEON:  Lodema Pilot, MD  ASSISTANT:  Dr. Andrey Campanile.  ANESTHESIA:  General endotracheal tube anesthesia with 50 mL of 0.5% lidocaine with epinephrine and 0.25% Marcaine plain in a 50:50 mixture.  FLUIDS:  1700 mL of crystalloid.  ESTIMATED BLOOD LOSS:  Minimal.  DRAINS:  A 19-French Blake drain placed posterior to the gastrojejunal anastomosis.  SPECIMENS:  None.  COMPLICATIONS:  None apparent.  FINDINGS:  A 21 mm EEA stapled gastrojejunal anastomosis and 60 mm side- to-side jejunojejunal anastomosis with antecolic, antigastric Roux-en-Y Roux limb.  INDICATION FOR PROCEDURE:  Ms. Whidbee is a 47 year old female with BMI of 39 and obesity related comorbidities of hypertension, and depression, who failed medical weight loss attempts and desires durable weight loss solution.  OPERATIVE DETAILS:  Ms. Kimbrough was seen and evaluated in the preoperative area and risks and benefits of the procedure were discussed in lay terms.  Informed consent was obtained.  She was given subcu heparin and prophylactic antibiotics and taken to the operating room, placed on the table in supine position.  General endotracheal tube anesthesia was obtained.  Her Foley catheter was placed and her abdomen was prepped and draped in a standard surgical fashion.  A procedure time- out was performed with all operative team members to confirm proper patient, procedure, and a 5-mm Optiview  trocar was used to access the peritoneum in the left upper quadrant on the first attempt and pneumoperitoneum was obtained.  Laparoscope was introduced.  There was no evidence of bleeding or bowel injury upon entry.  A 5-mm left rectus port was placed, 5 mm right upper quadrant port was placed and a 15-mm right rectus port was placed, all under direct visualization.  The omentum was retracted over the colon and the ligament of Treitz was identified.  I then ran the small bowel 50 cm from the ligament of Treitz and I certified that this would reach up to the diaphragm and it appeared to be without tension.  I then divided the small bowel transversely in this area with a 60 mm white load stapler and divided the mesentery down to its base, taking care to avoid undermining of the limbs.  Suture was placed on the Roux limb for a later identification and I then measured out 100 cm on the Roux limb and 2 small-bowel enterotomies were made and 60 mm white staple load was used to create a side-to-side stapled anastomosis.  The common enterotomy was then closed with running 2-0 Surgidac suture with running Lembert suture.  Cross- stitch was placed and another alignment stitch was placed underlying the Roux limb, the biliopancreatic limb, and the mesenteric defect was then  approximated with a running 2-0 Surgidac suture.  The greater omentum was divided up to the transverse colon and the Roux limb appeared to reach the diaphragm without any tension.  Nathanson liver retractor was passed through the epigastrium through a separate stab incision to retract the left lobe of the liver.  I decompressed the stomach consistent with her upper GI.  She did indeed have a small sliding-type hiatal hernia containing the gastric fat pad.  I opened up the gastroesophageal ligament and opened the pars flaccida, identified right and left crus posteriorly but did not close the defect at this time.  I then entered the  lesser sac posterior to the stomach and measured out 5 cm from the GE junction after I had certified that the stomach was all removed from the chest.  The lesser curve vasculature was divided with a transverse firing of a white stapler with Peri-Strips and all tubes removed from the mouth and the stomach and again measured 5 cm from the GE junction and a transverse firing across the stomach for 5 cm was taken.  I then opened up the lesser sac posterior to the stomach.  It was actually fairly easy to dissect posterior gastric tunnel and visualization was very good in this area.  I opened the window cephalad to the short gastric vessels and opened up the peritoneum at the angle of His and then started to create lateral wall of stomach.  This required 2 firings of the 60 mm Gold staple loads covered with Peri- Strips.  The stomach was completely divided and then small gastrotomy was made in the central portion of the distal staple line which was uncovered with Peri-Strips and the Orvil handle was passed through the mouth by the CRNA and this was exited through the gastrotomy that I had made.  The suture was cut and the tubing was delivered and passed off the table to the garbage.  I changed my gloves and then tightened up the gastrotomy around the anvil with 2-0 Surgidac Lembert sutures on each side of the anvil.  The Roux limb was identified and I certified that the limb was not twisted.  A enterotomy was made in the candy cane portion of the Roux limb and enlarged the 15-mm port site to accommodate placement of 21 mm EEA stapler.  This was passed to the abdominal wall through the enterotomy of the Roux limb and the spike was exited through the antimesenteric border of the Roux limb.  This was mated with the anvil and then anastomosis was created. The __________ was pulled over the stapler and the stapler was removed through the abdominal wall and doughnuts were inspected on the back table  and noted to be circumferential x2.  The mesentery to the Roux limb was divided with Harmonic Scalpel and the open portion of candy-cane was transected transversely and flushed through the anastomosis with another firing of a 60 mm white staple load and the resected portion of the small bowel was placed in an EndoCatch bag and removed from the abdomen, but not sent to Pathology.  Antitension stitches were placed on each side of the anastomosis using 2-0 Surgidac sutures and then I proceeded to close the hiatal hernia defect with a 2-0 Surgidac suture.  It was fairly small defect and it only required 1 suture which was placed posterior to the right and left crus.  I then clamped the Roux limb and filled the abdomen with saline solution while Dr. Andrey Campanile performed upper  endoscopy, passed the well-lubricated fiberoptic endoscope through the mouth and into the esophagus and into the gastric pouch while I had the bowel clamped and submerged under water.  There was no evidence of air leak. I insufflated this with air.  The anastomosis appeared patent.  There was no evidence of internal bleeding.  The pouch appeared to be adequate size.  The air was suctioned and the scope was removed and a 19-French Blake drain was passed through the abdomen and placed posterior to gastrojejunal anastomosis and exited through the left upper quadrant trocar and this was sutured in place with a nylon drain stitch.  Liver retractor was removed and inspected jejunojejunal anastomosis and there was no evidence of leakage.  I then closed the anterior fascia using interrupted 0 Vicryl sutures and the fascia was well approximated and sutures were secured.  The abdomen was re-insufflated with carbon dioxide gas and the abdominal wall closure was noted to be adequate. Both the anastomoses appeared adequate and the abdomen was hemostatic without any evidence of bleeding or bowel injury.  Final trocars removed and the  wounds were irrigated with sterile saline solution.  The wounds were injected with 0.5% lidocaine with epinephrine and 0.25% Marcaine in a 50:50 mixture.  Skin edges were approximated with 4-0 Monocryl subcuticular suture and the skin was washed and dried and Dermabond was applied.  The patient tolerated the procedure well without apparent complication and she was stable and ready for transfer to the recovery room stable condition.          ______________________________ Lodema Pilot, MD     BL/MEDQ  D:  10/01/2013  T:  10/01/2013  Job:  [id number]

## 2013-10-02 ENCOUNTER — Encounter (HOSPITAL_COMMUNITY): Payer: Self-pay | Admitting: General Surgery

## 2013-10-02 LAB — COMPREHENSIVE METABOLIC PANEL
ALT: 50 U/L — ABNORMAL HIGH (ref 0–35)
AST: 35 U/L (ref 0–37)
Albumin: 3.1 g/dL — ABNORMAL LOW (ref 3.5–5.2)
Alkaline Phosphatase: 45 U/L (ref 39–117)
BUN: 13 mg/dL (ref 6–23)
CO2: 25 mEq/L (ref 19–32)
Calcium: 8.7 mg/dL (ref 8.4–10.5)
Chloride: 102 mEq/L (ref 96–112)
Creatinine, Ser: 0.82 mg/dL (ref 0.50–1.10)
GFR calc Af Amer: >90 mL/min (ref >90)
GFR calc non Af Amer: 86 mL/min — ABNORMAL LOW (ref >90)
Glucose, Bld: 131 mg/dL — ABNORMAL HIGH (ref 70–99)
Potassium: 4.3 mEq/L (ref 3.5–5.1)
Sodium: 135 mEq/L (ref 135–145)
Total Bilirubin: 0.3 mg/dL (ref 0.3–1.2)
Total Protein: 6 g/dL (ref 6.0–8.3)

## 2013-10-02 LAB — CBC WITH DIFFERENTIAL/PLATELET
Basophils Absolute: 0 10*3/uL (ref 0.0–0.1)
Basophils Relative: 0 % (ref 0–1)
Eosinophils Absolute: 0.1 10*3/uL (ref 0.0–0.7)
Eosinophils Relative: 0 % (ref 0–5)
HCT: 34.5 % — ABNORMAL LOW (ref 36.0–46.0)
Hemoglobin: 11.7 g/dL — ABNORMAL LOW (ref 12.0–15.0)
Lymphocytes Relative: 17 % (ref 12–46)
Lymphs Abs: 2.3 10*3/uL (ref 0.7–4.0)
MCH: 32.1 pg (ref 26.0–34.0)
MCHC: 33.9 g/dL (ref 30.0–36.0)
MCV: 94.5 fL (ref 78.0–100.0)
Monocytes Absolute: 0.9 10*3/uL (ref 0.1–1.0)
Monocytes Relative: 7 % (ref 3–12)
Neutro Abs: 10.5 10*3/uL — ABNORMAL HIGH (ref 1.7–7.7)
Neutrophils Relative %: 76 % (ref 43–77)
Platelets: 248 10*3/uL (ref 150–400)
RBC: 3.65 MIL/uL — ABNORMAL LOW (ref 3.87–5.11)
RDW: 12.6 % (ref 11.5–15.5)
WBC: 13.8 10*3/uL — ABNORMAL HIGH (ref 4.0–10.5)

## 2013-10-02 LAB — HEMOGLOBIN AND HEMATOCRIT, BLOOD
HCT: 35.5 % — ABNORMAL LOW (ref 36.0–46.0)
Hemoglobin: 11.9 g/dL — ABNORMAL LOW (ref 12.0–15.0)

## 2013-10-02 NOTE — Progress Notes (Signed)
1 Day Post-Op  Subjective: She is doing okay this am.  Nausea improved this am.    Objective: Vital signs in last 24 hours: Temp:  [97.5 F (36.4 C)-98.8 F (37.1 C)] 97.8 F (36.6 C) (11/26 0615) Pulse Rate:  [60-75] 64 (11/26 0615) Resp:  [9-24] 18 (11/26 0615) BP: (92-111)/(48-68) 95/62 mmHg (11/26 0615) SpO2:  [97 %-100 %] 97 % (11/26 0615) Last BM Date: 09/30/13  Intake/Output from previous day: 11/25 0701 - 11/26 0700 In: 3969.3 [P.O.:60; I.V.:3908.3] Out: 660 [Urine:550; Drains:110] Intake/Output this shift:    General appearance: alert, cooperative and no distress Resp: nonlabored Cardio: normal rate, regular GI: soft, mild tenderness, ND, wounds okay, no peritoneal signs, JP thin ss Extremities: scd's bilat  Lab Results:   Recent Labs  10/02/13 0435  WBC 13.8*  HGB 11.7*  HCT 34.5*  PLT 248   BMET  Recent Labs  10/02/13 0435  NA 135  K 4.3  CL 102  CO2 25  GLUCOSE 131*  BUN 13  CREATININE 0.82  CALCIUM 8.7   PT/INR No results found for this basename: LABPROT, INR,  in the last 72 hours ABG No results found for this basename: PHART, PCO2, PO2, HCO3,  in the last 72 hours  Studies/Results: No results found.  Anti-infectives: Anti-infectives   Start     Dose/Rate Route Frequency Ordered Stop   10/01/13 0515  ertapenem (INVANZ) 1 g in sodium chloride 0.9 % 50 mL IVPB     1 g 100 mL/hr over 30 Minutes Intravenous  Once 10/01/13 0508 10/01/13 0720      Assessment/Plan: s/p Procedure(s): LAPAROSCOPIC ROUX-EN-Y GASTRIC BYPASS with hiatal hernia repair, WITH UPPER ENDOSCOPY (N/A) she looks okay this am.  HGB dropped, will hold lovenox this am and repeat.  if stable, restart.  I think that she looks okay to start bariatric clears  LOS: 1 day    Lodema Pilot DAVID 10/02/2013

## 2013-10-02 NOTE — Progress Notes (Signed)
Patient alert and oriented, pain is controlled. Patient is tolerating fluids, plan to advance to protein shake today.  Reviewed Gastric Bypass discharge instructions with patient and husband.  Patient is able to articulate understanding. Provided information on BELT program, Support Groups and [facility] outpatient pharmacy offerings.  Will continue to monitor

## 2013-10-02 NOTE — Progress Notes (Signed)
Doing fine with liquids.  Pain controlled.  Vitals okay.  Drain thin ss.  Abdomen okay.  Repeat HGB okay.  Will restart lovenox

## 2013-10-03 MED ORDER — ONDANSETRON 4 MG PO TBDP: 4.0000 mg | ORAL_TABLET | Freq: Three times a day (TID) | ORAL | Status: DC | PRN

## 2013-10-03 MED ORDER — PANTOPRAZOLE SODIUM 40 MG PO TBEC: 40.0000 mg | DELAYED_RELEASE_TABLET | Freq: Every day | ORAL | Status: DC

## 2013-10-03 MED ORDER — OXYCODONE HCL 5 MG/5ML PO SOLN: 5.0000 mg | ORAL | Status: DC | PRN

## 2013-10-03 NOTE — Progress Notes (Signed)
2 Days Post-Op  Subjective: She is feeling better.  Pain improving.  Passing gas.  No nausea. Tolerating liquids  Objective: Vital signs in last 24 hours: Temp:  [98.1 F (36.7 C)-98.4 F (36.9 C)] 98.1 F (36.7 C) (11/27 1610) Pulse Rate:  [60-68] 65 (11/27 0608) Resp:  [16-18] 18 (11/27 9604) BP: (97-112)/(65-74) 104/71 mmHg (11/27 0608) SpO2:  [94 %-98 %] 94 % (11/27 0608) Last BM Date: 10/03/13  Intake/Output from previous day: 11/26 0701 - 11/27 0700 In: 4215.8 [P.O.:1120; I.V.:3095.8] Out: 3000 [Urine:2950; Drains:50] Intake/Output this shift:    General appearance: alert, cooperative and no distress Resp: clear to auscultation bilaterally Cardio: normal rate, regular GI: soft, mild incisional tenderness, ND, wounds okay, no peritoneal signs, JP mostly serous Extremities: SCD's bilat  Lab Results:   Recent Labs  10/02/13 0435 10/02/13 0920  WBC 13.8*  --   HGB 11.7* 11.9*  HCT 34.5* 35.5*  PLT 248  --    BMET  Recent Labs  10/02/13 0435  NA 135  K 4.3  CL 102  CO2 25  GLUCOSE 131*  BUN 13  CREATININE 0.82  CALCIUM 8.7   PT/INR No results found for this basename: LABPROT, INR,  in the last 72 hours ABG No results found for this basename: PHART, PCO2, PO2, HCO3,  in the last 72 hours  Studies/Results: No results found.  Anti-infectives: Anti-infectives   Start     Dose/Rate Route Frequency Ordered Stop   10/01/13 0515  ertapenem (INVANZ) 1 g in sodium chloride 0.9 % 50 mL IVPB     1 g 100 mL/hr over 30 Minutes Intravenous  Once 10/01/13 0508 10/01/13 0720      Assessment/Plan: s/p Procedure(s): LAPAROSCOPIC ROUX-EN-Y GASTRIC BYPASS with hiatal hernia repair, WITH UPPER ENDOSCOPY (N/A) doing well, no evidence of postop complications.  should be okay for discharge.  instructions reviewed.  LOS: 2 days    Lodema Pilot DAVID 10/03/2013

## 2013-10-03 NOTE — Discharge Summary (Signed)
Physician Discharge Summary  Patient ID: Kimberly Stanley MRN: [id number] DOB/AGE: 1966-08-25 47 y.o.  Admit date: 10/01/2013 Discharge date: 10/03/2013  Admission Diagnoses:obesity  Discharge Diagnoses: same Active Problems:   * No active hospital problems. *   Discharged Condition: stable  Hospital Course: to OR 10/01/13 for lap RYGB.  Diet advanced on POD 1.  Tolerating this and pain controlled.  Ambulatory and stable for discharge on POD 2.  Consults: None  Significant Diagnostic Studies: none  Treatments: surgery: 10/01/13 lap RYGB  Disposition: 01-Home or Self Care  Discharge Orders   Future Appointments Provider Department Dept Phone   10/11/2013 9:30 AM Lodema Pilot, DO [facility] [place] [phone]   10/15/2013 3:30 PM [facility] [phone]   Future Orders Complete By Expires   Call MD for:  difficulty breathing, headache or visual disturbances  As directed    Call MD for:  extreme fatigue  As directed    Call MD for:  hives  As directed    Call MD for:  persistant dizziness or light-headedness  As directed    Call MD for:  persistant nausea and vomiting  As directed    Call MD for:  redness, tenderness, or signs of infection (pain, swelling, redness, odor or green/yellow discharge around incision site)  As directed    Call MD for:  severe uncontrolled pain  As directed    Call MD for:  temperature >100.4  As directed    Discharge instructions  As directed    Comments:     May shower tomorrow.   Call [phone] for follow up appointment with Biagio Quint in 3 weeks. May start vitamins and protein supplements. May start full liquid diet tomorrow x1 week, then pureed diet x1 week, then soft diet x1 week, then advance to high protein, low fat, low carb diet  Increase activity as tolerated. Drink plenty of fluids. Crush all meds or take liquid meds x4 weeks.   Increase activity slowly  As  directed        Medication List    STOP taking these medications       aspirin 81 MG tablet      TAKE these medications       busPIRone 15 MG tablet  Commonly known as:  BUSPAR  Take 15 mg by mouth 2 (two) times daily.     calcium carbonate 600 MG Tabs tablet  Commonly known as:  OS-CAL  Take 600 mg by mouth daily with breakfast.     cetirizine 10 MG tablet  Commonly known as:  ZYRTEC  Take 10 mg by mouth daily.     FLUoxetine 40 MG capsule  Commonly known as:  PROZAC  Take 40 mg by mouth every morning.     lamoTRIgine 150 MG tablet  Commonly known as:  LAMICTAL  Take 150 mg by mouth at bedtime.     levothyroxine 112 MCG tablet  Commonly known as:  SYNTHROID, LEVOTHROID  Take 112 mcg by mouth daily before breakfast.     ondansetron 4 MG disintegrating tablet  Commonly known as:  ZOFRAN ODT  Take 1 tablet (4 mg total) by mouth every 8 (eight) hours as needed for nausea or vomiting.     oxyCODONE 5 MG/5ML solution  Commonly known as:  ROXICODONE  Take 5-10 mLs (5-10 mg total) by mouth every 4 (four) hours as needed for severe pain.     pantoprazole 40 MG tablet  Commonly known as:  PROTONIX  Take 1 tablet (40 mg total) by mouth daily.     traZODone 50 MG tablet  Commonly known as:  DESYREL  Take 50 mg by mouth at bedtime.     valsartan-hydrochlorothiazide 320-12.5 MG per tablet  Commonly known as:  DIOVAN-HCT  Take 1 tablet by mouth every morning.     Vitamin D 2000 UNITS tablet  Take 2,000 Units by mouth daily.         SignedLodema Pilot DAVID 10/03/2013, 8:06 AM

## 2013-10-03 NOTE — Progress Notes (Signed)
Patient d/c to home with d/c instructions given in presence of spouse.  Jp drain d/c'd with ease. Pain and nausea denied. Tolerated bariatric clear liquids at breakfast & unjury. Voiced readiness to go home.

## 2013-10-11 ENCOUNTER — Ambulatory Visit (INDEPENDENT_AMBULATORY_CARE_PROVIDER_SITE_OTHER): Payer: 59 | Admitting: General Surgery

## 2013-10-11 ENCOUNTER — Encounter (INDEPENDENT_AMBULATORY_CARE_PROVIDER_SITE_OTHER): Payer: Self-pay | Admitting: General Surgery

## 2013-10-11 VITALS — BP 100/70 | HR 76 | Temp 97.5°F | Resp 18 | Ht 65.0 in | Wt 229.0 lb

## 2013-10-11 DIAGNOSIS — Z5189 Encounter for other specified aftercare: Secondary | ICD-10-CM

## 2013-10-11 DIAGNOSIS — Z4889 Encounter for other specified surgical aftercare: Secondary | ICD-10-CM

## 2013-10-11 NOTE — Progress Notes (Signed)
Subjective:     Patient ID: Kimberly Stanley, female   DOB: December 19, 1965, 47 y.o.   MRN: [id number]  HPI This patient follows up one and a half weeks status post Roux-en-Y gastric bypass. She says that she is doing very well and is off all her pain medication. She has no food intolerance and no nausea or vomiting or fevers or chills. She had some constipation initially but her bowels seem to be working now.  She is taking her vitamins and protein supplements and is still on a soft and full liquid diet.  She is walking daily.  Review of Systems     Objective:   Physical Exam  She is in no acute distress and nontoxic-appearing Her abdomen is soft nontender exam her incisions are healing nicely without any sign of infection.    Assessment:     Status post Roux-en-Y gastric bypass-doing well She is doing very well from her procedure and is recovering appropriately. She really hasn't had much weight loss yet and is down 6 pounds from her preoperative weight.  She has no food intolerance or any signs of postoperative complications. We discussed the diet and activities going forward do think that she can gradually increase her activity as tolerated. Recommended that she continue with her vitamins and protein supplements and is to follow up in one month to ensure that she is losing weight appropriately    Plan:      continue his bariatric dietand increase exercise and activity Followup in 1 month

## 2013-10-15 ENCOUNTER — Telehealth (INDEPENDENT_AMBULATORY_CARE_PROVIDER_SITE_OTHER): Payer: Self-pay | Admitting: General Surgery

## 2013-10-15 ENCOUNTER — Encounter: Payer: 59 | Attending: General Surgery

## 2013-10-15 DIAGNOSIS — Z713 Dietary counseling and surveillance: Secondary | ICD-10-CM | POA: Insufficient documentation

## 2013-10-15 DIAGNOSIS — E669 Obesity, unspecified: Secondary | ICD-10-CM | POA: Insufficient documentation

## 2013-10-15 NOTE — Telephone Encounter (Signed)
Pt called expressing concern that she had gastric bypass surgery (2 weeks ago) and has only lost 6 lbs.  She has just come from the nutritionist, who told her she is not doing anything wrong.  Wants to know why she hasn't lost more weight.  Reassured her that she is only 2 weeks out from surgery and can begin walking some.  Once she is cleared by the surgeon for exercise, that will help her weight loss.  Encouraged her to stop getting on the scales for a day or so, and look for loss of inches.  She stated she felt better talking about it.

## 2013-10-15 NOTE — Progress Notes (Signed)
Bariatric Class:  Appt start time: 1530 end time:  1630.  2 Week Post-Operative Nutrition Class  Patient was seen on 10/15/13 for Post-Operative Nutrition education at [facility].   Surgery date: 10/01/13 Surgery type: RYGB Starting weight at [facility]: 242 lbs on 9/18  Weight today: 227.5 lbs Weight change: 14.5 lbs Total weight lost: 14.5 lbs  TANITA  BODY COMP RESULTS  10/15/13   BMI (kg/m^2) 37.9   Fat Mass (lbs) 105.0   Fat Free Mass (lbs) 122.5   Total Body Water (lbs) 89.5   The following the learning objectives were met by the patient during this course:  Identifies Phase 3A (Soft, High Proteins) Dietary Goals and will begin from 2 weeks post-operatively to 2 months post-operatively  Identifies appropriate sources of fluids and proteins   States protein recommendations and appropriate sources post-operatively  Identifies the need for appropriate texture modifications, mastication, and bite sizes when consuming solids  Identifies appropriate multivitamin and calcium sources post-operatively  Describes the need for physical activity post-operatively and will follow MD recommendations  States when to call healthcare provider regarding medication questions or post-operative complications  Handouts given during class include:  Phase 3A: Soft, High Protein Diet Handout   Follow-Up Plan: Patient will follow-up at [facility] in 6 weeks for 8 week post-op nutrition visit for diet advancement per MD.

## 2013-10-15 NOTE — Patient Instructions (Signed)
Patient to follow Phase 3A-Soft, High Protein Diet and follow-up at [facility] in 6 weeks for 2 months post-op nutrition visit for diet advancement. 

## 2013-10-21 ENCOUNTER — Encounter (INDEPENDENT_AMBULATORY_CARE_PROVIDER_SITE_OTHER): Payer: Self-pay | Admitting: General Surgery

## 2013-11-21 ENCOUNTER — Ambulatory Visit (INDEPENDENT_AMBULATORY_CARE_PROVIDER_SITE_OTHER): Payer: 59 | Admitting: Surgery

## 2013-11-21 ENCOUNTER — Encounter (INDEPENDENT_AMBULATORY_CARE_PROVIDER_SITE_OTHER): Payer: Self-pay | Admitting: Surgery

## 2013-11-21 VITALS — BP 110/68 | HR 86 | Resp 16 | Ht 65.0 in | Wt 208.6 lb

## 2013-11-21 DIAGNOSIS — Z9884 Bariatric surgery status: Secondary | ICD-10-CM

## 2013-11-21 NOTE — Progress Notes (Signed)
Kimberly Stanley 48 y.o.  Body mass index is 34.71 kg/(m^2).  Patient Active Problem List   Diagnosis Date Noted  . Preseptal cellulitis 12/20/2011  . Leukocytosis 12/20/2011  . HTN (hypertension) 12/20/2011  . Anxiety and depression 12/20/2011  . Hypothyroidism 12/20/2011  . Abscess of forehead 12/20/2011    No Known Allergies  Past Surgical History  Procedure Laterality Date  . Cholecystectomy    . Dilation and curettage of uterus    . Tubal ligation    . 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  Frustrated with pace of weight loss.  Had long discussion with her and her husband about lowering carb intake.  I will see her back in 2 months to assess how she is doing.   Matt B. Hassell Done, MD, [facility] [phone] beeper [phone]  11/21/2013 10:11 AM

## 2013-11-21 NOTE — Patient Instructions (Signed)
Stay on low carb diet  Add exercise May add a cup of coffee daily

## 2013-11-26 ENCOUNTER — Encounter: Payer: 59 | Attending: General Surgery | Admitting: Dietician

## 2013-11-26 VITALS — Ht 65.0 in | Wt 206.0 lb

## 2013-11-26 DIAGNOSIS — Z713 Dietary counseling and surveillance: Secondary | ICD-10-CM | POA: Insufficient documentation

## 2013-11-26 DIAGNOSIS — E669 Obesity, unspecified: Secondary | ICD-10-CM | POA: Insufficient documentation

## 2013-11-26 NOTE — Patient Instructions (Signed)
Goals:  Follow Phase 3B: High Protein + Non-Starchy Vegetables  Eat 3-6 small meals/snacks, every 3-5 hrs  Increase lean protein foods to meet 60g goal  Increase fluid intake to 64oz +  Avoid drinking 15 minutes before, during and 30 minutes after eating  Aim for >30 min of physical activity daily  Think about trying PB2 or [employer] carb-free products

## 2013-11-26 NOTE — Progress Notes (Signed)
  Follow-up visit:  8 Weeks Post-Operative RYGB Surgery  Medical Nutrition Therapy:  Appt start time: 1200 end time:  1230.  Primary concerns today: Post-operative Bariatric Surgery Nutrition Management. Kimberly Stanley returns today with a 21.5 lb weight loss. Reports tolerating food ok. TSH was elevated soon after surgery which has since resolved. States that her weight loss has been "slow".  Surgery date: 10/01/13 Surgery type: RYGB Starting weight at [facility]: 242 lbs on 07/25/13  Weight today: 206.0 lbs  Weight change: 21.5 lbs Total weight lost: 36 lbs Goal weight: 125-130 lbs  TANITA  BODY COMP RESULTS  10/15/13 11/26/13   BMI (kg/m^2) 37.9 34.3   Fat Mass (lbs) 105.0 95.0   Fat Free Mass (lbs) 122.5 111.0   Total Body Water (lbs) 89.5 81.5    Preferred Learning Style:   No preference indicated   Learning Readiness:   Ready  24-hr recall: B (AM): Premier protein (30 g) Snk (AM): none  L (PM): dannon light and fit (12 g)  Snk (PM): 1/2 cheese stick (3 g)  D (PM): 3-4 oz  pork, chicken, fish with squash or green beans (21-28 g) Snk (PM): EAS Advantage shake or yogurt (12-17 g)  Fluid intake: 32 oz water or Powerade zero or Crystal Light, ~ 43 oz Estimated total protein intake: 78-90 g  Medications: see list Supplementation: taking most, though stopped taking iron since she was "very constipated"  Using straws: No Drinking while eating: No Hair loss: No Carbonated beverages: No N/V/D/C: some vomiting though not often, constipation though better without iron pill Dumping syndrome: None   Recent physical activity:  Walking 3-4 x week for at least 30 minutes and joined a gym yesterday  Progress Towards Goal(s):  In progress.  Handouts given during visit include:  Phase 3B High Protein + Non Starchy Vegetables    Nutritional Diagnosis:  [place]-3.3 Overweight/obesity related to past poor dietary habits and physical inactivity as evidenced by patient w/ recent RYGB surgery  following dietary guidelines for continued weight loss.    Intervention:  Nutrition education/diet advancement.  Teaching Method Utilized: Visual Auditory  Barriers to learning/adherence to lifestyle change: none  Demonstrated degree of understanding via:  Teach Back   Monitoring/Evaluation:  Dietary intake, exercise, lap band fills, and body weight. Follow up in 34months for 36month post-op visit.

## 2013-11-28 ENCOUNTER — Ambulatory Visit (INDEPENDENT_AMBULATORY_CARE_PROVIDER_SITE_OTHER): Payer: 59 | Admitting: Surgery

## 2013-12-02 ENCOUNTER — Telehealth: Payer: Self-pay | Admitting: Dietician

## 2013-12-02 NOTE — Telephone Encounter (Signed)
Kimberly Stanley documentation about restaurants strategies following bariatric surgery.

## 2013-12-05 ENCOUNTER — Encounter (INDEPENDENT_AMBULATORY_CARE_PROVIDER_SITE_OTHER): Payer: Self-pay | Admitting: Surgery

## 2013-12-25 ENCOUNTER — Encounter: Payer: 59 | Attending: General Surgery | Admitting: Dietician

## 2013-12-25 VITALS — Ht 65.0 in | Wt 196.0 lb

## 2013-12-25 DIAGNOSIS — E669 Obesity, unspecified: Secondary | ICD-10-CM | POA: Insufficient documentation

## 2013-12-25 DIAGNOSIS — Z713 Dietary counseling and surveillance: Secondary | ICD-10-CM | POA: Insufficient documentation

## 2013-12-25 NOTE — Progress Notes (Signed)
  Follow-up visit:  8 Weeks Post-Operative RYGB Surgery  Medical Nutrition Therapy:  Appt start time: 8250 end time:  1145  Primary concerns today:  Hebah returns today with a 10 pound weight loss. She states she feels discouraged because her weight loss has slowed. She is weighing herself at home every day and states that she is not willing to stop. Not having many vegetables besides squash and green beans. Sometimes having a salad with tomatoes and small amount of [place] or [place] dressing.  She is recording intake using My Fitness Pal. No longer taking Nascobal, as her insurance doesn't cover it. Finding another source for supplements.   Surgery date: 10/01/13 Surgery type: RYGB Starting weight at [facility]: 242 lbs on 07/25/13  Weight today: 196.0 lbs  Weight change: 10 lbs Total weight lost: 46 lbs Goal weight: 125-130 lbs  TANITA  BODY COMP RESULTS  10/15/13 11/26/13 12/25/2013   BMI (kg/m^2) 37.9 34.3 32.6   Fat Mass (lbs) 105.0 95.0 87.5   Fat Free Mass (lbs) 122.5 111.0 108.5   Total Body Water (lbs) 89.5 81.5 79.5    Preferred Learning Style:   No preference indicated   Learning Readiness:   Ready  24-hr recall: B (8:30 AM): Premier protein or advantEdge (17-30 g) Snk (AM): not usually L (PM): dannon light and fit or grilled chicken nuggets (12 g)  Snk (PM): cheese stick (6 g)  D (PM): 3-4 oz chicken, fish with zucchini, squash, or green beans or chili (21-28 g) Snk (PM): yogurt (12 g)  Fluid intake: water, Powerade zero, Crystal Light, hot tea, 1 cup regular coffee with FF SF creamer (approved by MD)   Still not meeting fluid requirements; but drinking "more than before"  Estimated total protein intake: 78-90 g  Medications: see list Supplementation: taking  Using straws: No Drinking while eating: No Hair loss: No Carbonated beverages: No N/V/D/C: none; constipation resolving Dumping syndrome: None   Recent physical activity:  Lacking recently due to  weather; tries to get in 7,000-10,000 steps per day; plans to increase physical activity  Progress Towards Goal(s):  In progress.    Nutritional Diagnosis:  [place]-3.3 Overweight/obesity related to past poor dietary habits and physical inactivity as evidenced by patient w/ recent RYGB surgery following dietary guidelines for continued weight loss.    Intervention:  Nutrition education/diet advancement.  Teaching Method Utilized: Visual Auditory  Barriers to learning/adherence to lifestyle change: none  Demonstrated degree of understanding via:  Teach Back   Monitoring/Evaluation:  Dietary intake, exercise, and body weight. Follow up in 3 months for 6 month post-op visit.

## 2013-12-25 NOTE — Patient Instructions (Addendum)
-  Focus on positive changes -Increase fluid intake to 64 oz -Find a sublingual B12 source -Maintain protein intake -Increase exercise!

## 2013-12-26 ENCOUNTER — Telehealth: Payer: Self-pay | Admitting: Dietician

## 2013-12-26 NOTE — Telephone Encounter (Signed)
Email on 12/25/2013  Sorry to keep bothering.  I'm just wondering when I can try pizza or pasta or other foods. I hear about others who eat anything just not much of it.       Thanks again  Do you know how many calories my body absorbs?  If I have 500, how many do I get?    Reply on 12/26/2013  Hi Helayna, Avoid pizza and pasta. Some patients are able to tolerate those foods eventually but most are not.  I'm afraid you may not be getting enough calories per day. Sometimes stress and not eating enough can cause a plateau in weight loss. We will be discussing plateaus tonight at support group. If you are able to attend tonight, I think this may be very helpful!  Hope to see you tonight! [name]

## 2013-12-26 NOTE — Telephone Encounter (Signed)
12/25/2013  Hi [name],       I was in there earlier today and forgot to ask you....can I try introducing in all foods now but just make sure my protein is met and my carbs stay under 50?  I think I was told 50 on carbs. I'm just wondering when I can eat more regular. I know I have to keep it down because I'm trying to lose not maintain at this point, but was hoping this surgery would allow me to eat more than I am. Within reason of course. It's a tool, I know.       Sorry for the late question.           Thank you,                 Kimberly Stanley  Reply on 12/25/2013  Candee Furbish, You can definitely start eating more vegetables (except potatoes, peas, and corn) at this point. I attached the phase 3B handout to this email as a reminder. A good reference point for carbs is 15 grams per meal. That's equivalent to 1 serving of carbohydrate per meal. Go slowly while trying new foods, of course. And avoid concentrated sweets and high-sugar foods.  Definitely continue meeting your protein needs; that is the priority!  Again, you're making great progress! Thanks for the email.

## 2014-01-13 ENCOUNTER — Telehealth: Payer: Self-pay | Admitting: Dietician

## 2014-01-13 NOTE — Telephone Encounter (Signed)
Email sent on 01/10/2014:  Hi, I was wondering if I could eat any kind of protein bar?  I bought a pack of the quest chips today because I haven't crunched anything since November. It made me wonder if I could have any of the other stuff.       Thanks for your help,             Janecia  Another question.  A friend of mine who had surgery through this office said she'd been given a sheet that told her what she could eat and when in her recovery. I'm very interested in this sheet. I don't want to try anything too soon that might hurt my new stomach.      Thanks again  My reply on 01/13/2014  Candee Furbish,  I'm glad you found the protein chips to satisfy your craving for some crunch! Some patients also enjoy raw vegetables for the crunchiness too (remember to chew really well and eat your protein foods first!).   Protein bars can be tricky. Some are too high in carbs/sugar. Choose bars that are high in protein and low in carbs. The Quest bars are good. Also the [facility] "Protein" bars are a good choice.   I have attached to this email the first and second post op diet handouts as a reminder of the foods you can choose from.  Thanks! [name]

## 2014-01-16 ENCOUNTER — Other Ambulatory Visit: Payer: Self-pay | Admitting: Obstetrics and Gynecology

## 2014-01-23 ENCOUNTER — Encounter (INDEPENDENT_AMBULATORY_CARE_PROVIDER_SITE_OTHER): Payer: Self-pay | Admitting: Surgery

## 2014-01-23 ENCOUNTER — Ambulatory Visit (INDEPENDENT_AMBULATORY_CARE_PROVIDER_SITE_OTHER): Payer: 59 | Admitting: Surgery

## 2014-01-23 VITALS — BP 134/72 | HR 75 | Temp 98.6°F | Resp 14 | Ht 65.0 in | Wt 187.0 lb

## 2014-01-23 DIAGNOSIS — Z9884 Bariatric surgery status: Secondary | ICD-10-CM

## 2014-01-23 NOTE — Progress Notes (Signed)
Kimberly Stanley 48 y.o.  Body mass index is 31.12 kg/(m^2).  Patient Active Problem List   Diagnosis Date Noted  . 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 Preseptal cellulitis 12/20/2011  . Leukocytosis 12/20/2011  . HTN (hypertension) 12/20/2011  . Anxiety and depression 12/20/2011  . Hypothyroidism 12/20/2011  . Abscess of forehead 12/20/2011    No Known Allergies  Past Surgical History  Procedure Laterality Date  . Cholecystectomy    . Dilation and curettage of uterus    . Tubal ligation    . 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  Doing well after [facility] bypass Nov 2014 Matt B. Hassell Done, MD, [facility] [phone] beeper [phone]  01/23/2014 12:30 PM

## 2014-01-23 NOTE — Patient Instructions (Signed)
Good Carbs:  Brocccoli, asparagus, spinach, mustard greens,

## 2014-03-24 ENCOUNTER — Encounter: Payer: 59 | Attending: Surgery | Admitting: Dietician

## 2014-03-24 VITALS — Ht 65.0 in | Wt 171.0 lb

## 2014-03-24 DIAGNOSIS — Z713 Dietary counseling and surveillance: Secondary | ICD-10-CM | POA: Insufficient documentation

## 2014-03-24 DIAGNOSIS — R634 Abnormal weight loss: Secondary | ICD-10-CM | POA: Insufficient documentation

## 2014-03-24 DIAGNOSIS — Z6828 Body mass index (BMI) 28.0-28.9, adult: Secondary | ICD-10-CM | POA: Insufficient documentation

## 2014-03-24 DIAGNOSIS — Z9884 Bariatric surgery status: Secondary | ICD-10-CM | POA: Insufficient documentation

## 2014-03-24 DIAGNOSIS — E669 Obesity, unspecified: Secondary | ICD-10-CM | POA: Insufficient documentation

## 2014-03-24 DIAGNOSIS — E663 Overweight: Secondary | ICD-10-CM

## 2014-03-24 NOTE — Patient Instructions (Addendum)
-  Focus on positive non-scale victories   TANITA  BODY COMP RESULTS  10/15/13 11/26/13 12/25/2013 03/24/14   BMI (kg/m^2) 37.9 34.3 32.6 28.5   Fat Mass (lbs) 105.0 95.0 87.5 64   Fat Free Mass (lbs) 122.5 111.0 108.5 107   Total Body Water (lbs) 89.5 81.5 79.5 78.5

## 2014-03-24 NOTE — Progress Notes (Signed)
  Follow-up visit:  6 months Post-Operative RYGB Surgery  Medical Nutrition Therapy:  Appt start time: 2119 end time:  1145  Primary concerns today:  Kimberly Stanley returns today with a 25 pound weight loss. She is still weighing herself at home every day and states that she is not willing to stop. She reports that she is "still obsessed" with her weight and rate of weight loss but she is a little more relaxed. She recently went to the beach for a week and tried 2 tortilla chips and 1 slice of pizza. She was happy to find that she still lost 2 pounds that week. [name] and I discussed focusing more on non-scale victories.   Surgery date: 10/01/13 Surgery type: RYGB Starting weight at [facility]: 242 lbs on 07/25/13  Weight today: 171.0 lbs Weight change: 25 lbs Total weight lost: 71 lbs Goal weight: 142 lbs  TANITA  BODY COMP RESULTS  10/15/13 11/26/13 12/25/2013 03/24/14   BMI (kg/m^2) 37.9 34.3 32.6 28.5   Fat Mass (lbs) 105.0 95.0 87.5 64   Fat Free Mass (lbs) 122.5 111.0 108.5 107   Total Body Water (lbs) 89.5 81.5 79.5 78.5    Preferred Learning Style:   No preference indicated   Learning Readiness:   Ready  24-hr recall: B (8:30 AM): Premier protein (30 g) Snk (AM): not usually L (PM): spinach salad with veggies and packet of tuna (17 g)  Snk (PM): none  D (PM): 2-4 oz chicken, pork chops, or fish with and a green vegetables (14-28 g) Snk (PM): yogurt (12 g)  Fluid intake: water, Powerade zero, hot tea, 1 cup regular coffee with FF SF creamer (approved by MD) (64 oz per patient)  Estimated total protein intake: 73-90 g  Medications: see list Supplementation: taking (also taking Biotin)  Using straws: occasionally Drinking while eating: No Hair loss: feels like it has thinned Carbonated beverages: No N/V/D/C: none Dumping syndrome: None   Recent physical activity:  At least 10,000 steps per day  Progress Towards Goal(s):  In progress.    Nutritional Diagnosis:  [place]-3.3  Overweight/obesity related to past poor dietary habits and physical inactivity as evidenced by patient w/ recent RYGB surgery following dietary guidelines for continued weight loss.    Intervention:  Nutrition education/diet advancement.  Teaching Method Utilized: Visual Auditory  Barriers to learning/adherence to lifestyle change: none  Demonstrated degree of understanding via:  Teach Back   Monitoring/Evaluation:  Dietary intake, exercise, and body weight. Follow up in 3 months for 9 month post-op visit.

## 2014-03-26 ENCOUNTER — Telehealth: Payer: Self-pay | Admitting: Dietician

## 2014-03-26 NOTE — Telephone Encounter (Signed)
Email from [place] on 03/24/2014:  Marykay Lex, I'm sorry to bother you, but you know how specific I am. Was wondering if you could tell me again how many calories I can go to now and how many grams of carbs I should stay near.      Thanks         Maanvi  Reply on 03/25/2014: Candee Furbish,  You should be taking in 700-900 calories per day and around 60 grams of carbs per day.  [name]

## 2014-04-09 ENCOUNTER — Other Ambulatory Visit: Payer: Self-pay

## 2014-04-09 DIAGNOSIS — Z1231 Encounter for screening mammogram for malignant neoplasm of breast: Secondary | ICD-10-CM

## 2014-04-22 ENCOUNTER — Telehealth: Payer: Self-pay | Admitting: Dietician

## 2014-04-22 NOTE — Telephone Encounter (Signed)
Email from patient on 04/15/2014:  Hi, I'm so distraught!  Over the last week I have gained 1.2 pounds, and nothing is different. This is absolutely freaking me out. I'm at 162 and not near finished. Is this normal, or should I be doing something?    I appreciate your input  As a follow up, I'm totally freaking out because if I gain eating what I'm eating, then what happens when I start adding foods in? This can't be it!  My reply on 04/15/2014:  Candee Furbish! Please try not to freak out. It's totally normal for weight loss to slow a little bit at this point. If you are following the recommendations it's very possible that the weight gain is related to water. If you are in the area this week, you are welcome to stop by [facility] and you can get on the [place] scale. That would give [place] a little more insight into what's going on.  Again, please try not to panic and continue to get at least 60 grams of protein each day as well as 64 oz of fluid.  [name]

## 2014-04-28 ENCOUNTER — Telehealth: Payer: Self-pay | Admitting: Dietician

## 2014-04-28 NOTE — Telephone Encounter (Signed)
Email from patient on 04/25/2014:  Hey. Came by today. My weight has been up and down. Wondering what your thoughts are after seeing what my numbers are on the tanita. Get back to me whenever you can. No rush. Just continuing to plug along.       Thanks,            [name]   My reply on 04/28/2014:  Candee Furbish, I'm so glad you stopped by to get on the Tanita! I'm looking back at the information from May 18. It looks like you have lost 10 pounds of fat and you are maintaining your lean body mass. This is exactly what we want to see. It is normal for weight loss to slow down at this point. Just continue to get your protein and fluids in.  Keep up the good work and feel free to come back and get on the Tanita scale again! [name]

## 2014-05-12 ENCOUNTER — Encounter (INDEPENDENT_AMBULATORY_CARE_PROVIDER_SITE_OTHER): Payer: Self-pay | Admitting: Surgery

## 2014-05-13 ENCOUNTER — Other Ambulatory Visit (INDEPENDENT_AMBULATORY_CARE_PROVIDER_SITE_OTHER): Payer: Self-pay

## 2014-05-13 DIAGNOSIS — K912 Postsurgical malabsorption, not elsewhere classified: Secondary | ICD-10-CM

## 2014-05-13 DIAGNOSIS — Z09 Encounter for follow-up examination after completed treatment for conditions other than malignant neoplasm: Secondary | ICD-10-CM

## 2014-05-16 ENCOUNTER — Other Ambulatory Visit (INDEPENDENT_AMBULATORY_CARE_PROVIDER_SITE_OTHER): Payer: Self-pay

## 2014-05-16 ENCOUNTER — Ambulatory Visit
Admission: RE | Admit: 2014-05-16 | Discharge: 2014-05-16 | Disposition: A | Discharge status: Home / Self Care | Payer: 59 | Source: Ambulatory Visit

## 2014-05-16 DIAGNOSIS — Z09 Encounter for follow-up examination after completed treatment for conditions other than malignant neoplasm: Secondary | ICD-10-CM

## 2014-05-16 DIAGNOSIS — K912 Postsurgical malabsorption, not elsewhere classified: Secondary | ICD-10-CM

## 2014-05-16 DIAGNOSIS — Z1231 Encounter for screening mammogram for malignant neoplasm of breast: Secondary | ICD-10-CM

## 2014-05-16 LAB — LIPID PANEL
Cholesterol: 132 mg/dL (ref 0–200)
HDL: 36 mg/dL — ABNORMAL LOW (ref >39)
LDL Cholesterol: 75 mg/dL (ref 0–99)
Total CHOL/HDL Ratio: 3.7 Ratio
Triglycerides: 107 mg/dL (ref <150)
VLDL: 21 mg/dL (ref 0–40)

## 2014-05-16 LAB — COMPREHENSIVE METABOLIC PANEL
ALT: 39 U/L — ABNORMAL HIGH (ref 0–35)
AST: 23 U/L (ref 0–37)
Albumin: 4.3 g/dL (ref 3.5–5.2)
Alkaline Phosphatase: 60 U/L (ref 39–117)
BUN: 13 mg/dL (ref 6–23)
CO2: 26 mEq/L (ref 19–32)
Calcium: 9.9 mg/dL (ref 8.4–10.5)
Chloride: 103 mEq/L (ref 96–112)
Creat: 0.73 mg/dL (ref 0.50–1.10)
Glucose, Bld: 81 mg/dL (ref 70–99)
Potassium: 4.6 mEq/L (ref 3.5–5.3)
Sodium: 139 mEq/L (ref 135–145)
Total Bilirubin: 0.3 mg/dL (ref 0.2–1.2)
Total Protein: 6.5 g/dL (ref 6.0–8.3)

## 2014-05-16 LAB — VITAMIN B12: Vitamin B-12: 795 pg/mL (ref 211–911)

## 2014-05-16 LAB — CBC WITH DIFFERENTIAL/PLATELET
Basophils Absolute: 0.1 10*3/uL (ref 0.0–0.1)
Basophils Relative: 1 % (ref 0–1)
Eosinophils Absolute: 0.2 10*3/uL (ref 0.0–0.7)
Eosinophils Relative: 3 % (ref 0–5)
HCT: 41.9 % (ref 36.0–46.0)
Hemoglobin: 14.5 g/dL (ref 12.0–15.0)
Lymphocytes Relative: 26 % (ref 12–46)
Lymphs Abs: 2 10*3/uL (ref 0.7–4.0)
MCH: 33.8 pg (ref 26.0–34.0)
MCHC: 34.6 g/dL (ref 30.0–36.0)
MCV: 97.7 fL (ref 78.0–100.0)
Monocytes Absolute: 0.8 10*3/uL (ref 0.1–1.0)
Monocytes Relative: 10 % (ref 3–12)
Neutro Abs: 4.5 10*3/uL (ref 1.7–7.7)
Neutrophils Relative %: 60 % (ref 43–77)
Platelets: 255 10*3/uL (ref 150–400)
RBC: 4.29 MIL/uL (ref 3.87–5.11)
RDW: 13.1 % (ref 11.5–15.5)
WBC: 7.5 10*3/uL (ref 4.0–10.5)

## 2014-05-16 LAB — IRON AND TIBC
%SAT: 30 % (ref 20–55)
Iron: 91 ug/dL (ref 42–145)
TIBC: 301 ug/dL (ref 250–470)
UIBC: 210 ug/dL (ref 125–400)

## 2014-05-16 LAB — FOLATE: Folate: >20 ng/mL

## 2014-05-20 LAB — VITAMIN B1: Vitamin B1 (Thiamine): 17 nmol/L (ref 8–30)

## 2014-05-21 ENCOUNTER — Encounter (INDEPENDENT_AMBULATORY_CARE_PROVIDER_SITE_OTHER): Payer: Self-pay | Admitting: Surgery

## 2014-06-03 ENCOUNTER — Telehealth (INDEPENDENT_AMBULATORY_CARE_PROVIDER_SITE_OTHER): Payer: Self-pay

## 2014-06-03 NOTE — Telephone Encounter (Signed)
Refill for Nascobal faxed to [facility] @ [phone]

## 2014-06-05 ENCOUNTER — Ambulatory Visit (INDEPENDENT_AMBULATORY_CARE_PROVIDER_SITE_OTHER): Payer: 59 | Admitting: Surgery

## 2014-06-05 ENCOUNTER — Encounter (INDEPENDENT_AMBULATORY_CARE_PROVIDER_SITE_OTHER): Payer: Self-pay | Admitting: Surgery

## 2014-06-05 VITALS — BP 126/74 | HR 75 | Temp 98.0°F | Resp 16 | Ht 65.0 in | Wt 147.0 lb

## 2014-06-05 DIAGNOSIS — Z9884 Bariatric surgery status: Secondary | ICD-10-CM

## 2014-06-05 NOTE — Progress Notes (Signed)
Kimberly Stanley 48 y.o.  Body mass index is 24.46 kg/(m^2).  Patient Active Problem List   Diagnosis Date Noted  . 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 Preseptal cellulitis 12/20/2011  . Leukocytosis 12/20/2011  . HTN (hypertension) 12/20/2011  . Anxiety and depression 12/20/2011  . Hypothyroidism 12/20/2011  . Abscess of forehead 12/20/2011    No Known Allergies    Past Surgical History  Procedure Laterality Date  . Cholecystectomy    . Dilation and curettage of uterus    . Tubal ligation    . 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  Doing great.  88 lbs at least down from surgery.  Had good discussion on what to do and what to expect.  Will see her back in nov 2014.  She may come back when she has maceration and skin irritation from panniculitis.   Matt B. Hassell Done, MD, [facility] [phone] beeper [phone]  06/05/2014 12:56 PM

## 2014-06-24 ENCOUNTER — Encounter: Payer: 59 | Attending: Surgery | Admitting: Dietician

## 2014-06-24 DIAGNOSIS — IMO0002 Reserved for concepts with insufficient information to code with codable children: Secondary | ICD-10-CM | POA: Insufficient documentation

## 2014-06-24 DIAGNOSIS — Z9884 Bariatric surgery status: Secondary | ICD-10-CM | POA: Insufficient documentation

## 2014-06-24 DIAGNOSIS — Z713 Dietary counseling and surveillance: Secondary | ICD-10-CM | POA: Diagnosis present

## 2014-06-24 DIAGNOSIS — E669 Obesity, unspecified: Secondary | ICD-10-CM | POA: Insufficient documentation

## 2014-06-24 NOTE — Progress Notes (Signed)
  Follow-up visit:  9 months Post-Operative RYGB Surgery  Medical Nutrition Therapy:  Appt start time: 830 end time:  900  Primary concerns today:  Kimberly Stanley returns today having reached her previously reported goal weight, however states that she wants to lose another 15-20 pounds. She is meeting her protein needs and eating a lot of green vegetables and very few carbs. She has been seeing a Social worker and reports they have been working on thoughts about food. 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 [name] moved out recently and [name] states she "ate lots of pizza." Still obsessing about calories, uses My Fitness Pal. Still weighs every day. Adding some beans.    Surgery date: 10/01/13 Surgery type: RYGB Starting weight at [facility]: 242 lbs on 07/25/13  Weight today: 141.5 lbs Weight change: 29.5 lbs Total weight lost: 100.5 lbs   TANITA  BODY COMP RESULTS  10/15/13 11/26/13 12/25/2013 03/24/14 06/24/14   BMI (kg/m^2) 37.9 34.3 32.6 28.5 23.5   Fat Mass (lbs) 105.0 95.0 87.5 64 40   Fat Free Mass (lbs) 122.5 111.0 108.5 107 101.5   Total Body Water (lbs) 89.5 81.5 79.5 78.5 74.5    Preferred Learning Style:   No preference indicated   Learning Readiness:   Ready  Change in progress  24-hr recall: B (8:30 AM): Premier protein OR EAS AdvantEdge (17-30 g) Snk (AM): not usually L (PM): omelet with spinach and peppers (12g) Snk (PM): cheese stick OR PB2 (7g) OR Quest chips D (PM): 2-4 oz chicken, pork chops, or fish with and a green vegetables (14-28 g) Snk (PM): yogurt (12 g)  Fluid intake: water, Powerade zero, hot tea, 1 cup regular coffee with FF SF creamer (approved by MD) (64 oz per patient)  Estimated total protein intake: 60+ g  Medications: see list Supplementation: taking (also taking Biotin)  Using straws: occasionally Drinking while eating: No Hair loss: no Carbonated beverages: No N/V/D/C: none Dumping syndrome: None   Recent physical activity:   At least 10,000 steps per day  Progress Towards Goal(s):  In progress.    Nutritional Diagnosis:  [place]-3.3 Overweight/obesity related to past poor dietary habits and physical inactivity as evidenced by patient w/ recent RYGB surgery following dietary guidelines for continued weight loss.    Intervention:  Nutrition education/diet advancement.  Teaching Method Utilized: Visual Auditory  Barriers to learning/adherence to lifestyle change: none  Demonstrated degree of understanding via:  Teach Back   Monitoring/Evaluation:  Dietary intake, exercise, and body weight. Follow up in 3 months for 12 month post-op visit.

## 2014-06-24 NOTE — Patient Instructions (Addendum)
-  Ideal body weight: 137 lbs  -Increase weight-bearing exercise -Aim for 15 grams of carbs per meal -Try Quest bars  - [email or web address] (fill out registration page)     **This is your journey, but you're THERE!!!!     TANITA  BODY COMP RESULTS  10/15/13 11/26/13 12/25/2013 03/24/14 06/24/14   BMI (kg/m^2) 37.9 34.3 32.6 28.5 23.5   Fat Mass (lbs) 105.0 95.0 87.5 64 40   Fat Free Mass (lbs) 122.5 111.0 108.5 107 101.5   Total Body Water (lbs) 89.5 81.5 79.5 78.5 74.5

## 2014-07-09 ENCOUNTER — Encounter (INDEPENDENT_AMBULATORY_CARE_PROVIDER_SITE_OTHER): Payer: Self-pay | Admitting: Surgery

## 2014-09-05 ENCOUNTER — Telehealth: Payer: Self-pay | Admitting: Dietician

## 2014-09-05 NOTE — Telephone Encounter (Signed)
Email from patient:  Hi Kimberly Stanley Alert you had a great week at your conference. I'll bet it was fascinating. I'm actually jealous. I am struggling. This month I've only lost 3 pounds and I had been down more than that but in the last week have gone up 1.4 pounds. I am doing nothing different. I am so close to my goal and just can't get there. I'm scared I will never be able to add any other foods into my diet if I'm having trouble eating like I am. At home now I weigh 126.6. I was at 125.2 and my home goal was 125. So close!  I don't know if you have any thoughts or answers as to why this is happening. I'm not finished losing and this is killing me.        Thank you for your support and if I need to I can come in and see you before our next appointment in November. Just let me know.          Kimberly Stanley  My reply:   Kimberly Stanley! Sorry for the late reply. It seems like your body is fighting further weight loss. Our bodies have a set "happy weight" where we are the healthiest; some of [place] are naturally bigger, some of [place] are naturally smaller and the rest of [place] are somewhere in between.  When we try and manipulate our set "happy weight" it is very difficult and dangerous, and frustrating. It seems that you have gone past that weight and if you are severely restricting your intake to lose further, that can be really dangerous.  As a dietitian I can only recommend that you maintain a healthy weight. I also recommend discussing this with your counselor.   If you and your counselor feel like you are developing an unhealthy relationship with food/disordered eating pattern/obsession with weight then we have a dietitian in our office who specializes in patients with these concerns. This is something that can sometimes happen after people are so focused on their diet and weight loss after bariatric surgery. I just want to make sure that you receive the proper care and support.  Take care! [name]

## 2014-09-15 ENCOUNTER — Encounter (INDEPENDENT_AMBULATORY_CARE_PROVIDER_SITE_OTHER): Payer: Self-pay | Admitting: Surgery

## 2014-09-16 ENCOUNTER — Encounter (INDEPENDENT_AMBULATORY_CARE_PROVIDER_SITE_OTHER): Payer: Self-pay

## 2014-09-17 ENCOUNTER — Encounter: Payer: 59 | Attending: Surgery | Admitting: Dietician

## 2014-09-17 VITALS — Ht 65.0 in | Wt 123.5 lb

## 2014-09-17 DIAGNOSIS — Z713 Dietary counseling and surveillance: Secondary | ICD-10-CM | POA: Insufficient documentation

## 2014-09-17 DIAGNOSIS — Z9884 Bariatric surgery status: Secondary | ICD-10-CM | POA: Insufficient documentation

## 2014-09-17 DIAGNOSIS — E669 Obesity, unspecified: Secondary | ICD-10-CM | POA: Diagnosis not present

## 2014-09-17 DIAGNOSIS — Z6820 Body mass index (BMI) 20.0-20.9, adult: Secondary | ICD-10-CM | POA: Insufficient documentation

## 2014-09-17 NOTE — Progress Notes (Signed)
  Follow-up visit: 12 months Post-Operative RYGB Surgery  Medical Nutrition Therapy:  Appt start time: 1100 end time:  1200  Primary concerns today:  Kimberly Stanley returns today having surpassed her original goal weight by 20 lbs. She admits to being preoccupied with food and her weight, and states "I still feel overweight." She agrees that she has unusually poor body image. Based on her food recall, it is evident that she is not taking in sufficient nutrition and states she has been having feelings of hunger. She can tolerate all the foods she has tried.   Surgery date: 10/01/13 Surgery type: RYGB Starting weight at [facility]: 242 lbs on 07/25/13  Weight today: 123.5 lbs Weight change: 18 lbs Total weight lost: 118.5 lbs   TANITA  BODY COMP RESULTS  10/15/13 11/26/13 12/25/2013 03/24/14 06/24/14 09/17/14   BMI (kg/m^2) 37.9 34.3 32.6 28.5 23.5 20.6   Fat Mass (lbs) 105.0 95.0 87.5 64 40 23.5   Fat Free Mass (lbs) 122.5 111.0 108.5 107 101.5 100   Total Body Water (lbs) 89.5 81.5 79.5 78.5 74.5 73    Preferred Learning Style:   No preference indicated   Learning Readiness:   Change in progress  24-hr recall:  Eating 500 calories per day per patient  B (8:30 AM): EAS AdvantEdge (17g) and coffee Snk (AM): not usually L (PM): 2 Tbsp PB2 Snk (PM): coffee with FF SF creamer D (PM): 4 oz lean meat and green vegetable (28g) Snk (PM): yogurt (12 g)  Fluid intake: water, Powerade zero, hot tea, 1 cup regular coffee with FF SF creamer (approved by MD) (64 oz per patient)  Estimated total protein intake: ~57 g  Medications: see list Supplementation: taking (also taking Biotin)  Using straws: occasionally Drinking while eating: No Hair loss: no Carbonated beverages: No N/V/D/C: constipation, taking laxatives Dumping syndrome: None   Recent physical activity:  At least 10,000 steps per day; walking  Progress Towards Goal(s):  In progress.    Nutritional Diagnosis:  [place]-3.3  Overweight/obesity related to past poor dietary habits and physical inactivity as evidenced by patient w/ recent RYGB surgery following dietary guidelines for continued weight loss.    Intervention:  Nutrition education provided. Discussed moving into maintenance phase and possibly restoring some weight. Encouraged patient to add more variety and volume of foods. Referred her to therapist, Kimberly Beals, LCSW. Discouraged further weight loss and explained the importance of maintaining lean tissue. I expressed my nutrition-related concerns to the patient.  Goals:   -Call Kimberly Stanley  -Work on being okay with a little bit of weight gain  -Positive body image  -Enjoying food and dealing with guilt  -Accepting possible muscle gain with exercise -Goal: weigh 2x a week -Work on finding some enjoyment with food -Add more calories  -Aim for [phone] calories per day -Avoid losing more weight -Continue to get at least 60-80 grams of protein per day -Keep eating every 3-5 hours -Add some high fiber carbs to every meal  Nuts, beans, veggie/bean patties  Increase carbs to 45-60 grams per day  -Use the tool to maintain! -Don't fight your weight anymore  Teaching Method Utilized: Visual Auditory  Barriers to learning/adherence to lifestyle change: none  Demonstrated degree of understanding via:  Teach Back   Monitoring/Evaluation:  Dietary intake, exercise, and body weight. Follow up in 3 months for 12 month post-op visit.

## 2014-09-17 NOTE — Patient Instructions (Addendum)
-  Call Everardo Beals  -Work on being okay with a little bit of weight gain  -Positive body image  -Enjoying food and dealing with guilt  -Accepting possible muscle gain with exercise  -Goal: weigh 2x a week  -Work on finding some enjoyment with food  -Add more calories  -Aim for [phone] calories per day  -Avoid losing more weight  -Continue to get at least 60-80 grams of protein per day  -Keep eating every 3-5 hours  -Add some high fiber carbs to every meal  Nuts, beans, veggie/bean patties  Increase carbs to 45-60 grams per day  -Use the tool to maintain! -Don't fight your weight anymore   TANITA  BODY COMP RESULTS  10/15/13 11/26/13 12/25/2013 03/24/14 06/24/14 09/17/14   BMI (kg/m^2) 37.9 34.3 32.6 28.5 23.5 20.6   Fat Mass (lbs) 105.0 95.0 87.5 64 40 23.5   Fat Free Mass (lbs) 122.5 111.0 108.5 107 101.5 100   Total Body Water (lbs) 89.5 81.5 79.5 78.5 74.5 73

## 2014-09-18 NOTE — Progress Notes (Signed)
EAT-26 Test administered Score: 45

## 2014-09-22 ENCOUNTER — Ambulatory Visit (INDEPENDENT_AMBULATORY_CARE_PROVIDER_SITE_OTHER): Payer: 59 | Admitting: Internal Medicine

## 2014-09-22 ENCOUNTER — Encounter: Payer: Self-pay | Admitting: Internal Medicine

## 2014-09-22 VITALS — BP 98/60 | HR 63 | Temp 98.3°F | Resp 12 | Ht 65.0 in | Wt 125.4 lb

## 2014-09-22 DIAGNOSIS — E039 Hypothyroidism, unspecified: Secondary | ICD-10-CM

## 2014-09-22 MED ORDER — TIROSINT 125 MCG PO CAPS: 125.0000 ug | ORAL_CAPSULE | Freq: Every day | ORAL | Status: DC

## 2014-09-22 NOTE — Patient Instructions (Signed)
Please stop Levothyroxine and start Tirosint 50 mcg daily in am. You do not need to set up your alarm to take this! Come back for labs in 4-5 weeks. Please come back for a follow-up appointment in 4 months.

## 2014-09-22 NOTE — Progress Notes (Signed)
Patient ID: Kimberly Stanley, female   DOB: [birth year], 48 y.o.   MRN: [id number]   HPI  Kimberly Stanley is a 48 y.o.-year-old female, referred by her PCP, Dr. Dema Severin, for management of uncontrolled hypothyroidism.  Pt. has been dx with hypothyroidism in ~2005; is on Levothyroxine 112 >> 125 mcg recently, taken: - fasting, at 6 am, sets alarm then goes back to sleep - with water - separated by 1h-2h from b'fast  - + calcium at night - on multivitamins with lunch or dinner - no iron, PPIs  I reviewed pt's thyroid tests: 09/12/2014: TSH 10.65 >> LT4 increased to 125 mcg 05/20/2014: TSH 3.47 Lab Results  Component Value Date   TSH 2.415 07/03/2013   TSH 7.360* 12/19/2011    Pt describes: - no weight gain, but she is on a strict post GBP diet - + fatigue - + cold intolerance - + mm aches - + anxiety and depression - no constipation - no dry skin - + hair loss  Pt denies feeling nodules in neck, hoarseness, dysphagia/odynophagia, SOB with lying down.  She has + FH of thyroid disorders in: Hypothyroidism. No FH of thyroid cancer.  No h/o radiation tx to head or neck. No recent use of iodine supplements.  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  ROS: Constitutional: see HPI Eyes: no blurry vision, no xerophthalmia ENT: no sore throat, no nodules palpated in throat, no dysphagia/odynophagia, no hoarseness Cardiovascular: no CP/SOB/palpitations/leg swelling Respiratory: no cough/SOB Gastrointestinal: no N/V/D/C Musculoskeletal: no muscle/joint aches Skin: no rashes, + hair loss Neurological: + tremors/no numbness/tingling/dizziness Psychiatric: + depression/+ anxiety + low libido  Past Medical History  Diagnosis Date  . Thyroid disease   . Hypertension   . Hypothyroidism   . Depression    Past Surgical History  Procedure Laterality Date  . Cholecystectomy    . Dilation and curettage of uterus    . Tubal  ligation    . 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 Marital Status: Married    Spouse Name: N/A    Number of Children: 3   . Not working now   Social History Main Topics  . Smoking status: Former Smoker -- 0.50 packs/day for 15 years    Types: Cigarettes    Quit date: 07/05/2013  . Smokeless tobacco: Never Used  . Alcohol Use: No  . Drug Use: Yes    Special: "Crack" cocaine     Comment: MAY 2013 LAST DRUG USE  . Sexual Activity: Yes    Birth Control/ Protection: Other-see comments     Comment: Novasure   Current Outpatient Prescriptions on File Prior to Visit  Medication Sig Dispense Refill  . busPIRone (BUSPAR) 15 MG tablet Take 30 mg by mouth 2 (two) times daily.     Marland Kitchen lamoTRIgine (LAMICTAL) 150 MG tablet Take 70 mg by mouth at bedtime.     Marland Kitchen NASCOBAL 500 MCG/0.1ML SOLN     . traZODone (DESYREL) 50 MG tablet Take 50 mg by mouth at bedtime.     Marland Kitchen levothyroxine (SYNTHROID, LEVOTHROID) 112 MCG tablet Take 112 mcg by mouth daily before breakfast.    . pantoprazole (PROTONIX) 40 MG tablet Take 1  tablet (40 mg total) by mouth daily. 90 tablet 1   No current facility-administered medications on file prior to visit.   No Known Allergies Family History  Problem Relation Age of Onset  . Cancer Mother     breast  . Cancer Paternal Uncle     panctratic  . Arthritis Other   . Cancer Other   . Hyperlipidemia Other   . Hypertension Other   . Obesity Other    PE: BP 98/60 mmHg  Pulse 63  Temp(Src) 98.3 F (36.8 C) (Oral)  Resp 12  Ht 5\' 5"  (1.651 m)  Wt 125 lb 6.4 oz (56.881 kg)  BMI 20.87 kg/m2  SpO2 98% Wt Readings from Last 3 Encounters:  09/22/14 125 lb 6.4 oz (56.881 kg)  09/17/14 123 lb 8 oz (56.019 kg)  06/05/14 147 lb (66.679 kg)   Constitutional:  normal weight, in NAD Eyes: PERRLA, EOMI, no exophthalmos ENT: moist mucous membranes, no thyromegaly, no cervical lymphadenopathy Cardiovascular: RRR, No MRG Respiratory: CTA B Gastrointestinal: abdomen soft, NT, ND, BS+ Musculoskeletal: no deformities, strength intact in all 4 Skin: moist, warm, no rashes Neurological: Mild tremor with outstretched hands, DTR normal in all 4  ASSESSMENT: 1. Hypothyroidism  PLAN:  1. Patient with long-standing hypothyroidism, on levothyroxine therapy. She appears euthyroid, but has several symptoms that can be attributed to hypothyroidism: Fatigue, cold intolerance. It is very difficult to tease out the symptoms related to her drastic weight loss from thyroid related symptoms. She does not appear to have a goiter, thyroid nodules, or neck compression symptoms - We discussed about correct intake of levothyroxine, fasting, with water, separated by at least 30 minutes from breakfast, and separated by more than 4 hours from calcium, iron, multivitamins, acid reflux medications (PPIs). I advised her not to set up her alarm, but only take the levothyroxine as she wakes up. - I suggested to switch to Tirosint (liquid levothyroxine), since there is a concern for malabsorption secondary to her gastric bypass. Tirosint is absorbed faster and is preferred in this situation. I will start her on the same dose as her current levothyroxine dose, 125 g daily, however, we might need to decrease this in the future - will check thyroid tests in a month: TSH, free T4 - If these are abnormal, she will need to return in 6-8 weeks for repeat labs - If these are normal, I will see her back in 4 months  - time spent with the patient: 45 minutes, of which >50% was spent in obtaining information about her symptoms, reviewing her previous labs, evaluations, and treatments, counseling her about her condition and about correct administration of levothyroxine (please see the discussed  topics above), and developing a plan to further investigate it. She had a number of questions which I addressed.

## 2014-09-23 ENCOUNTER — Encounter: Payer: 59 | Admitting: *Deleted

## 2014-09-23 ENCOUNTER — Telehealth: Payer: Self-pay | Admitting: Internal Medicine

## 2014-09-23 DIAGNOSIS — F509 Eating disorder, unspecified: Secondary | ICD-10-CM

## 2014-09-23 DIAGNOSIS — E669 Obesity, unspecified: Secondary | ICD-10-CM | POA: Diagnosis not present

## 2014-09-23 DIAGNOSIS — Z9884 Bariatric surgery status: Secondary | ICD-10-CM

## 2014-09-23 NOTE — Telephone Encounter (Signed)
Called pt and advised her that the medication is a liquid capsule. Pt was confused. Pt understood once I explained.

## 2014-09-23 NOTE — Progress Notes (Signed)
Appointment start time: 1500  Appointment end time: 1600  Patient was seen on 09/23/14 for nutrition counseling pertaining to disordered eating  Primary care MD: Dr. Harlan Stains Therapist: Esperanza Heir Any other medical team members: Dr. Cruzita Lederer endocrinology, Dr. Hassell Done surgeon, [name] bariatric RD  Assessment  Kimberly Stanley was referred by her previous RD, [name] for concern over excessive weight loss s/p bariatric surgery 1 year ago and preoccupation with food/weigh tloss.  Per [name] has lost too much weight and has become obsessed with loosing weight and has restricted her calorie intake to much lower than recommendations.  [name] has voiced her concerns to [name] and together, [name] and I referred [name] to Esperanza Heir for disordered eating therapy.  [name] states that the past year since her Roux-En-Y has been a frustrating and dicouraging process for her; she felt she didn't lose weight fast enough.  Before surgery she weighed 242 pounds.  Initially her Goal weight was to lose 100 pound stopping at 142 pounds.  However, when she reached that goal, it was wasn't enough and she wanted to lose additional weight.  She would like to weigh 115 pounds, but further questioning reveals that would not be sufficient and she would like to lose more.  Her husband has repeatedly told her to stop losing weight, her previous RD has told her to stop losing weight.  [name] states she is more concerned with "being skinny than being attractive."  Confirms history of depression, anxiety, and substance abuse.  Denies issues with food prior to bariatric surgery, states she was "just fat."  Further probing reveals history of sneaking foods as a child because parents favored siblings over [name] and [name] felt deprived.   Weight: 125 Height: 65 Expected body weight: 144 lb (25% body fat) Percent expected body weight: 87%  Weight history:  Highest weight: 250 lb    Lowest weight: currently  125 Most consistent weight: 180-220  What would you like to weigh:115, but further probing admits that will not be low enough How has weight changed in the past year: lost over 100 pounds; 14 pounds of which are fat free mass   TANITA BODY COMP RESULTS  10/15/13 11/26/13 12/25/2013 03/24/14 06/24/14 09/17/14 09/23/14  BMI (kg/m^2) 37.9 34.3 32.6 28.5 23.5 20.6 20.9  Fat Mass (lbs) 105.0 95.0 87.5 64 40 23.5 17.5  Fat Free Mass (lbs) 122.5 111.0 108.5 107 101.5 100 108  Total Body Water (lbs) 89.5 81.5 79.5 78.5 74.5 73 79    Medical Information:   Changes in hair, skin, nails since dramatic weight loss started: hair is coarser and falls out mor easily.  Feels like she breaks out more often.   Chewing/swallowing difficulties: none  Relux or heartburn: occasionally- maybe once a week and some foods get stuck  Trouble with teeth: couple cavities this past year.  (increased risk for oral disease with anorexia)   Constipation, diarrhea: constipation; BM every 3 days and has to strain.  Has taken laxitives in the past.  Liked the weight loss and started to abuse laxitives  No menses due to endometrial ablation   Positive for cold intolerance and fatigue  09/22/14 endocrinology visit revealed BP 98/60 mmHg = LOW DIASTOLIC BP  Pulse 63 bbm  Mental health diagnosis: none, meets criteria for AN, restricting type  EAT-26 score: 45  Positive for preoccupation with food, calorie restriction (more than per nutrition recommendations), feeling guilty about eating, obsessive weighing herself at home, mood is dependant on number  on scale.     Dietary assessment: A typical day consists of 75meals and 1-2 snacks  B (8:30 AM): EAS AdvantEdge (17g pro; 2 g CHO) and coffee Snk (AM): not usually L (PM): 2 Tbsp PB2 (5g pro; 5 g CHO) Snk (PM): coffee with FF SF creamer D (PM): 4 oz lean meat and green vegetable (28g pro) Snk (PM): Danon light and fit yogurt (12  g pro; 8-9g CHO)  Fluid intake: water, Powerade zero, hot tea, 1 cup regular coffee with FF SF creamer (approved by MD) (64 oz per patient)  Safe foods include: protein, fats Avoided foods include:breads, rice, pasta, cakes, cookies, chocolate, pizza, etc   What Methods Do You Use To Control Your Weight (Compensatory behaviors)?           Restricting (calories, fat, carbs): 800 kcal, 60+ g fat, 50 g carb or less, no fat restriction   However, dietary recall reveals patient is Eating <500 calories per day; <20g CHO; 62g pro; <10g fat  Laxatives: 1 capful 2 times a week  Exercise (what type): walks 10000 steps/day  Denies binging, purging, diuretics  Estimated energy intake: <500 kcal  Estimated energy needs per [name], bariatric RD: 1000 kcal 45-60 g CHO 60 g pro 55-60 g fat  Nutrition Diagnosis: NI-1.4 Inadequate energy intake As related to dietary intake restricted in calories, carbohydrates, and fat.  As evidenced by 117 pound wieght loss in 1  year.  Intervention/Goals: Began process of correcting distorted body thoughts.  Corrected thought that she has gained weight when she actually increased her water retention due to increased sodium consumption this week.   Discussed "food is fuel" and what each macronutrient is needed for: carbs are necessary for energy for the body and the brain.  Discussed role of nutrition on both physical health and mental health: "A starved brain doesn't work" and inadequate nutrition causes increased anxiety, irritability, decreased focus, and other mental health complications; discussed Ansel Keys starvation study.  Recommended decreasing rate of weight monitoring.  Wilfred Lacy has also recommended this.  Patient agreed to take 1 day off/week. (she currently weighs herself daily) Recommended increasing carbs.  She agreed to add another Danon Light and Fit yogurt to give an additional 8-9g CHO/day  Meal plan:    3 meals    2 snacks To provide 600 kcal     30 g CHO    70 g pro   10-15 g fat    Monitoring and Evaluation: Patient will follow up in 1 weeks.

## 2014-09-23 NOTE — Telephone Encounter (Signed)
Pt states tirisent needs to be called in to her pharmacy it is a liquid please advise

## 2014-09-24 ENCOUNTER — Ambulatory Visit: Payer: 59 | Admitting: Dietician

## 2014-09-25 ENCOUNTER — Other Ambulatory Visit: Payer: Self-pay | Admitting: Family Medicine

## 2014-09-25 ENCOUNTER — Encounter: Payer: Self-pay | Admitting: *Deleted

## 2014-09-25 DIAGNOSIS — R29898 Other symptoms and signs involving the musculoskeletal system: Secondary | ICD-10-CM

## 2014-09-30 ENCOUNTER — Encounter: Payer: 59 | Admitting: *Deleted

## 2014-09-30 DIAGNOSIS — E441 Mild protein-calorie malnutrition: Secondary | ICD-10-CM

## 2014-09-30 DIAGNOSIS — Z9884 Bariatric surgery status: Secondary | ICD-10-CM

## 2014-09-30 DIAGNOSIS — E669 Obesity, unspecified: Secondary | ICD-10-CM | POA: Diagnosis not present

## 2014-10-01 NOTE — Progress Notes (Signed)
Appointment start time: 1200  Appointment end time: 1245  Patient was seen on 10/01/14 for nutrition counseling pertaining to disordered eating  Primary care MD: Dr. Harlan Stanley Therapist: Esperanza Stanley Any other medical team members: Dr. Cruzita Stanley endocrinology, Dr. Hassell Stanley surgeon, Kimberly Stanley bariatric RD  Assessment: per patient, she has lost some weight this week, but "not enough."  She did not weigh at all this past weekend and that was really hard for her.  She has another visit with Kimberly Stanley this afternoon.  She has not made any dietary changes since last week.   HPI: Kimberly Stanley was referred by her previous RD, Kimberly Stanley for concern over excessive weight loss s/p bariatric surgery 1 year ago and preoccupation with food/weigh tloss.  Per Kimberly Stanley has lost too much weight and has become obsessed with loosing weight and has restricted her calorie intake to much lower than recommendations.  Kimberly Stanley has voiced her concerns to Kimberly Stanley and together, Kimberly Stanley and I referred Kimberly Stanley to Kimberly Stanley for disordered eating therapy.  Kimberly Stanley states that the past year since her Roux-En-Y has been a frustrating and dicouraging process for her; she felt she didn't lose weight fast enough.  Before surgery she weighed 242 pounds.  Initially her Goal weight was to lose 100 pound stopping at 142 pounds.  However, when she reached that goal, it was wasn't enough and she wanted to lose additional weight.  She would like to weigh 115 pounds, but further questioning reveals that would not be sufficient and she would like to lose more.  Her husband has repeatedly told her to stop losing weight, her previous RD has told her to stop losing weight.  Kimberly Stanley states she is more concerned with "being skinny than being attractive."  Confirms history of depression, anxiety, and substance abuse.  Denies issues with food prior to bariatric surgery, states she was "just fat."  Further probing reveals history of sneaking foods  as a child because parents favored siblings over Kimberly Stanley felt deprived.   Weight: 125 Height: 65 Expected body weight: 144 lb (25% body fat) Percent expected body weight: 87%  Weight history:  Highest weight: 250 lb    Lowest weight: currently 125 Most consistent weight: 180-220  What would you like to weigh:115, but further probing admits that will not be low enough How has weight changed in the past year: lost over 100 pounds; 14 pounds of which are fat free mass   TANITA BODY COMP RESULTS  10/15/13 11/26/13 12/25/2013 03/24/14 06/24/14 09/17/14 09/23/14  BMI (kg/m^2) 37.9 34.3 32.6 28.5 23.5 20.6 20.9  Fat Mass (lbs) 105.0 95.0 87.5 64 40 23.5 17.5  Fat Free Mass (lbs) 122.5 111.0 108.5 107 101.5 100 108  Total Body Water (lbs) 89.5 81.5 79.5 78.5 74.5 73 79    Medical Information:   Changes in hair, skin, nails since dramatic weight loss started: hair is coarser and falls out mor easily.  Feels like she breaks out more often.   Chewing/swallowing difficulties: none  Relux or heartburn: occasionally- maybe once a week and some foods get stuck  Trouble with teeth: couple cavities this past year.  (increased risk for oral disease with anorexia)   Constipation, diarrhea: constipation; BM every 3 days and has to strain.  Has taken laxitives in the past.  Liked the weight loss and started to abuse laxitives  No menses due to endometrial ablation   Positive for cold intolerance and fatigue  09/22/14 endocrinology visit revealed BP  98/60 mmHg = LOW DIASTOLIC BP  Pulse 63 bbm  Mental health diagnosis: none, meets criteria for AN, restricting type  EAT-26 score: 45  Positive for preoccupation with food, calorie restriction (more than per nutrition recommendations), feeling guilty about eating, obsessive weighing herself at home, mood is dependant on number on scale.     Dietary assessment: A typical day consists of 71meals and 1-2  snacks  B (8:30 AM): EAS AdvantEdge (17g pro; 2 g CHO) and coffee Snk (AM): not usually L (PM): 2 Tbsp PB2 (5g pro; 5 g CHO) Snk (PM): coffee with FF SF creamer D (PM): 4 oz lean meat and green vegetable (28g pro) Snk (PM): Danon light and fit yogurt (12 g pro; 8-9g CHO)  Fluid intake: water, Powerade zero, hot tea, 1 cup regular coffee with FF SF creamer (approved by MD) (64 oz per patient)  Safe foods include: protein, fats Avoided foods include:breads, rice, pasta, cakes, cookies, chocolate, pizza, etc   What Methods Do You Use To Control Your Weight (Compensatory behaviors)?           Restricting (calories, fat, carbs): 800 kcal, 60+ g fat, 50 g carb or less, no fat restriction   However, dietary recall reveals patient is Eating <500 calories per day; <20g CHO; 62g pro; <10g fat  Laxatives: 1 capful 2 times a week  Exercise (what type): walks 10000 steps/day  Denies binging, purging, diuretics  Estimated energy intake: <500 kcal  Estimated energy needs per Kimberly Stanley, bariatric RD: 1000 kcal 45-60 g CHO 60 g pro 55-60 g fat  Nutrition Diagnosis: NI-1.4 Inadequate energy intake As related to dietary intake restricted in calories, carbohydrates, and fat.  As evidenced by 117 pound wieght loss in 1  year.  Intervention/Goals: Discussed what happens to body when it doesn't get adequate nutrition.  Kimberly Stanley was shocked.  She realizes she has been hurting herself with her strict diet and she got angry at the wrong information she had believed over the years. She admits it will be very difficult to her to change her mindset and to practice better self-care.  She admits she has never practiced self-care in her life.  We discussed the discrepancy between what she needs, what she says she tries to eat, and what the reality of what she actually is eating.  Our goal is to get her intake up to what she says she wants to eat: 800 calories, 60 g protein, 50 g carb, 50 g fat.  She is terrified  of starches so She agreed to add 1 fruit exchange to give her an additional 60 calories and 15 g carbohydrates.  She also agreed to add 1 fat exchange to give an additional 45 calories and 5 g fat.  She agreed to consume no less than 650 calories.day Discussed weight gain goals: ideal body weight is when her body functions normally again and she feels good:  ie able to regulate body temperature, able to move bowels daily, stop hair loss, have increased energy, etc. Goal to gain 0.5 pounds/week.  Meal plan:    3 meals    2 snacks To provide 650 kcal    350 g CHO    70 g pro   15-20 g fat  Handouts given: My meal plan card What happens to my body when I don't eat?   What happens when I don't eat?  Your body uses food as fuel to keep all the important organs and cells in your body  running well. When you don't eat, your body doesn't get the fuel it needs and your organs and body parts can suffer.  The Heart & Circulation: Your heart is a muscle that can shrink and weaken when you don't eat. This can create circulation problems and an irregular heartbeat. Blood pressure can get very low during starvation and make you feel dizzy when you stand up.  The Stomach: Your stomach becomes smaller when you don't eat so when you start eating again, your stomach is likely to feel uncomfortable (you may have stomach aches and/or gas). Also, your stomach will not empty as fast making you feel full longer.  The Intestines: Your intestines will move food slowly often resulting in constipation (trouble having a bowel movement) and/or stomach aches or cramps when you eat meals.  The Brain: Your brain, which controls the rest of your body's functions, does not work properly without food. For example, you may have trouble thinking clearly or paying attention and/or you could also feel anxious or sad.  Body Cells: The balance of electrolytes in the blood can be changed with malnutrition or with purging. Without food, the  amount of potassium and phosphorous can get dangerously low which can cause problems with your muscles, changes in your brain functioning, and cause life-threatening heart and rhythm problems.  Bones: When you don't eat, your bones often become weak due to low calcium and low hormone levels, which increases your risk of getting broken bones now and developing weak bones when you're older.  Body Temperature: Your body naturally lowers its temperature in times of starvation to conserve energy and protect vital organs. When this happens, there is a decrease in circulation (blood flow) to your fingers and toes which will often cause your hands and feet to feel cold and look bluish.  Skin: Your skin becomes dry when your body is not well hydrated and when it does not get enough vitamins and minerals from food. The skin will naturally protect your body during periods of starvation by developing fine, soft hair called "lanugo" that covers the skin to keep your body warm.  Hair: When your hair doesn't get enough nourishment from the vitamins and minerals that are naturally found in healthy food, it becomes dry, thin and it can even fall out.  Nails: Your nails require nutrients in the form of vitamins and minerals from your diet. When you don't eat, you deny your body what it needs and your nails become dry and brittle and break easily.  Teeth: Your teeth need vitamin D and calcium from food sources. Without both of these minerals, you can end up with dental problems such as tooth decay and gum disease. Purging can also destroy tooth enamel.  Monitoring and Evaluation: Patient will follow up in 2 weeks.

## 2014-10-04 ENCOUNTER — Ambulatory Visit
Admission: RE | Admit: 2014-10-04 | Discharge: 2014-10-04 | Disposition: A | Discharge status: Home / Self Care | Payer: 59 | Source: Ambulatory Visit | Attending: Family Medicine | Admitting: Family Medicine

## 2014-10-04 DIAGNOSIS — R29898 Other symptoms and signs involving the musculoskeletal system: Secondary | ICD-10-CM

## 2014-10-09 ENCOUNTER — Other Ambulatory Visit (INDEPENDENT_AMBULATORY_CARE_PROVIDER_SITE_OTHER): Payer: Self-pay | Admitting: Surgery

## 2014-10-09 DIAGNOSIS — R1900 Intra-abdominal and pelvic swelling, mass and lump, unspecified site: Secondary | ICD-10-CM

## 2014-10-15 ENCOUNTER — Encounter: Payer: 59 | Attending: Surgery | Admitting: *Deleted

## 2014-10-15 ENCOUNTER — Other Ambulatory Visit: Payer: 59

## 2014-10-15 VITALS — Wt 121.0 lb

## 2014-10-15 DIAGNOSIS — Z6820 Body mass index (BMI) 20.0-20.9, adult: Secondary | ICD-10-CM | POA: Diagnosis not present

## 2014-10-15 DIAGNOSIS — R634 Abnormal weight loss: Secondary | ICD-10-CM

## 2014-10-15 DIAGNOSIS — Z713 Dietary counseling and surveillance: Secondary | ICD-10-CM | POA: Insufficient documentation

## 2014-10-15 DIAGNOSIS — Z9884 Bariatric surgery status: Secondary | ICD-10-CM | POA: Diagnosis not present

## 2014-10-15 DIAGNOSIS — E441 Mild protein-calorie malnutrition: Secondary | ICD-10-CM

## 2014-10-15 DIAGNOSIS — E669 Obesity, unspecified: Secondary | ICD-10-CM | POA: Insufficient documentation

## 2014-10-15 NOTE — Patient Instructions (Signed)
Shake with 1/2 carb choice Quest bar with 1/2 carb choice Meat cooked in fat with green vegetable yogurt

## 2014-10-15 NOTE — Progress Notes (Signed)
Appointment start time: 0800 Appointment end time: 0900  Patient was seen on 10/15/14 for nutrition counseling pertaining to disordered eating  Primary care MD: Dr. Harlan Stains Therapist: Esperanza Heir Any other medical team members: Dr. Cruzita Lederer endocrinology, Dr. Hassell Done surgeon, [name] bariatric RD  Assessment: Amariz continues to lose weight.  She has further restricted her calories and has now cut out her protein shake.  She does not weigh herself on the weekends anymore "no weigh weekends" and she did allow herself to eat more foods on thanksgiving, but she has compensated by eating less for the past week or so.  She continues to lose fat free mass.  Per Esperanza Heir, therapist, Berlyn's ultimate goal weight is 112. Today in session she states her goal weight is 110 lb.  At home without clothes on she weighs 118 lb.  Her goal weight keeps trending down further and further   Weight: 121 Height: 65 Expected body weight: 144 lb (25% body fat) Percent expected body weight: 84%  HPI: Brynli was referred by her previous RD, [name] for concern over excessive weight loss s/p bariatric surgery 1 year ago and preoccupation with food/weigh tloss.  Per [name] has lost too much weight and has become obsessed with loosing weight and has restricted her calorie intake to much lower than recommendations.  [name] has voiced her concerns to [name] and together, [name] and I referred Vaughn to Esperanza Heir for disordered eating therapy.  Jochebed states that the past year since her Roux-En-Y has been a frustrating and dicouraging process for her; she felt she didn't lose weight fast enough.  Before surgery she weighed 242 pounds.  Initially her Goal weight was to lose 100 pound stopping at 142 pounds.  However, when she reached that goal, it was wasn't enough and she wanted to lose additional weight.  She would like to weigh 115 pounds, but further questioning reveals that would not be sufficient  and she would like to lose more.  Her husband has repeatedly told her to stop losing weight, her previous RD has told her to stop losing weight.  Masiyah states she is more concerned with "being skinny than being attractive."  Confirms history of depression, anxiety, and substance abuse.  Denies issues with food prior to bariatric surgery, states she was "just fat."  Further probing reveals history of sneaking foods as a child because parents favored siblings over [place] and [place] felt deprived.   Weight history:  Highest weight: 250 lb    Lowest weight: currently 125 Most consistent weight: 180-220  What would you like to weigh:115, but further probing admits that will not be low enough How has weight changed in the past year: lost over 100 pounds; 14 pounds+ of which are fat free mass   TANITA BODY COMP RESULTS  10/15/13 11/26/13 12/25/2013 03/24/14 06/24/14 09/17/14 09/23/14 10/15/14  BMI (kg/m^2) 37.9 34.3 32.6 28.5 23.5 20.6 20.9 20.1  Fat Mass (lbs) 105.0 95.0 87.5 64 40 23.5 17.5 16.5  Fat Free Mass (lbs) 122.5 111.0 108.5 107 101.5 100 108 104.5  Total Body Water (lbs) 89.5 81.5 79.5 78.5 74.5 73 79 76.5    Medical Information:   Changes in hair, skin, nails since dramatic weight loss started: hair is coarser and falls out mor easily.  Feels like she breaks out more often.   Chewing/swallowing difficulties: none  Relux or heartburn: occasionally- maybe once a week and some foods get stuck  Trouble with teeth: couple cavities this  past year.  (increased risk for oral disease with anorexia)   Constipation, diarrhea: constipation; BM every 3 days and has to strain.  Has taken laxitives in the past.  Liked the weight loss and started to abuse laxitives  No menses due to endometrial ablation   Positive for cold intolerance and fatigue  09/22/14 endocrinology visit revealed BP 98/60 mmHg = LOW DIASTOLIC BP  Pulse 63 bbm  Mental health diagnosis:  none, meets criteria for AN, restricting type  EAT-26 score: 45  Positive for preoccupation with food, calorie restriction (more than per nutrition recommendations), feeling guilty about eating, obsessive weighing herself at home, mood is dependant on number on scale.     Dietary assessment: A typical day consists of 75meals and 1-2 snacks  monday- B: coffee  L: cookie and cream protein bar S: coffee with creamer D: 1 chicken breast and 1 cup steamed broccoli with some salt S: dannon lite and fit yogurt  Fluid intake: water, Powerade zero, hot tea, 1 cup regular coffee with FF SF creamer (approved by MD) (64 oz per patient)  Safe foods include: protein, fats Avoided foods include:breads, rice, pasta, cakes, cookies, chocolate, pizza, etc   What Methods Do You Use To Control Your Weight (Compensatory behaviors)?           Restricting (calories, fat, carbs): 800 kcal, 60+ g fat, 50 g carb or less, no fat restriction   However, dietary recall reveals patient is Eating ~500 calories per day; <20g CHO; <62g pro; <10g fat  Laxatives: 1 capful 2 times a week  Exercise (what type): walks 10000 steps/day  Denies binging, purging, diuretics  Estimated energy intake: ~500 kcal  Estimated energy needs per [name], bariatric RD: 1000 kcal 45-60 g CHO 60 g pro 55-60 g fat  Nutrition Diagnosis: NI-1.4 Inadequate energy intake As related to dietary intake restricted in calories, carbohydrates, and fat.  As evidenced by 117 pound wieght loss in 1  year.  Intervention/Goals:  Challenges cognitive distortions about weight/fat/nutrition.  Discussed basal metabolic rate and how much she needs to function properly: per Tanita scan her BMR is 1267 kcal/day.  Talked about choosing her thoughts like she chooses her clothes: she can choose body positive thoughts or critical thoughts.  Disucsssed discrepancies betwee what she thinks she is eaitng vs what she actually is eating, vs what she  actually NEEDS to eat.    Meal plan:    3 meals    1-2 snacks To provide 650 kcal    35 g CHO    70 g pro   15-20 g fat  Shake with 1/2 carb choice Quest bar with 1/2 carb choice Meat cooked in fat with green vegetable yogurt   Monitoring and Evaluation: Patient will follow up in 2 weeks.

## 2014-10-20 ENCOUNTER — Other Ambulatory Visit (INDEPENDENT_AMBULATORY_CARE_PROVIDER_SITE_OTHER): Payer: 59

## 2014-10-20 DIAGNOSIS — E039 Hypothyroidism, unspecified: Secondary | ICD-10-CM

## 2014-10-20 LAB — T4, FREE: Free T4: 1.45 ng/dL (ref 0.60–1.60)

## 2014-10-20 LAB — TSH: TSH: 0.48 u[IU]/mL (ref 0.35–4.50)

## 2014-10-22 ENCOUNTER — Encounter: Payer: 59 | Admitting: *Deleted

## 2014-10-22 VITALS — Ht 65.0 in | Wt 119.0 lb

## 2014-10-22 DIAGNOSIS — E441 Mild protein-calorie malnutrition: Secondary | ICD-10-CM

## 2014-10-22 DIAGNOSIS — R634 Abnormal weight loss: Secondary | ICD-10-CM

## 2014-10-22 DIAGNOSIS — E669 Obesity, unspecified: Secondary | ICD-10-CM | POA: Diagnosis not present

## 2014-10-22 DIAGNOSIS — F509 Eating disorder, unspecified: Secondary | ICD-10-CM

## 2014-10-22 NOTE — Progress Notes (Signed)
Appointment start time: 0800 Appointment end time: 0900  Patient was seen on 10/22/14 for nutrition counseling pertaining to disordered eating  Primary care MD: Dr. Harlan Stains Therapist: Esperanza Heir Any other medical team members: Dr. Cruzita Lederer endocrinology, Dr. Hassell Done surgeon, [name] bariatric RD  Assessment:  Weight: 119 Height: 65 Expected body weight: 144 lb (25% body fat) Percent expected body weight: 82.6%  Kimberly Stanley continues to lose weight.  She states her new goal is 110 lb.  She keeps lowering her weight goal.  Even though she told her therapist that her ultimate goal was 115, she told me it was 112, now she has lowered it to 110.  As per her eating disorder, it will never be enough.  The goal will keep decreasing until she dies.  This Probation officer is very concerned.  Her boy fat percentage continues to drop; currently at 19% and the recommended body fat percentage for a woman is 24%.  She continues to lose fat free mass, not just fat.  She has a fair amount of lose skin from her dramatic weight loss surgery.  She thinks this is fat she needs to lose, but it's skin that can never be lost.  If she did not have that extra skin, she would most likely weigh between 105-110 pounds.  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  She has no menses due to ablation, so it's hard to find a benchmark to convince her that her weight is too low.  She does complain of low energy, nerve pain, cold sensitivity, and constipation.  She has not had a medical exam in awhile.  I am curious about vital signs  Previous psych screening EAT-26 score significant >20 Patient score 45  This week she has increased her food variety: She had pizza twice this week and that tasted delicicious.  She states she had tried to increase her intake- she's been haivng bites of cookies or biscuits sometimes.  However, her net calories remains  unchanged because she compensates for the pizza and cake by decreasing her calories elsewhere.  She continues to not drink her protein shake in the mornings.  She continues to struggle with "all or nothing mentality"- ate 1/2 thin crust medium pizza and felt uncomfortable, then an hour later ate pound cake.  She states she ate all that because she felt like she couldn't ever eat pizza again and she better eat it while she had the chance   Dietary assessment: A typical day consists of 3 meals and 1-2 snacks  monday- B: PB2 (2 tbsp) L: cookie and cream protein bar (quest bar) S: coffee with creamer D: 1 hamburger steak with onions and mushroom; pizza; chicken with vegetable S: dannon lite and fit yogurt  Total intake: ~600 kcal 70 g protein 20 g fat 30-40 g carb  Fluid intake: water, Powerade zero, hot tea, 1 cup regular coffee with FF SF creamer (approved by MD) (64 oz per patient)   HPI: Kimberly Stanley was referred by her previous RD, [name] for concern over excessive weight loss s/p bariatric surgery 1 year ago and preoccupation with food/weigh tloss.  Per Kimberly Stanley has lost too much weight and has become obsessed with loosing weight and has restricted her calorie intake to much lower than recommendations.  Kimberly Stanley has voiced her concerns to Kimberly Stanley and together, Kimberly Stanley and I referred Asha to Esperanza Heir for disordered eating therapy.  Kimberly Stanley states that the past year since her Roux-En-Y has been a frustrating and dicouraging process for her; she felt she didn't lose weight fast enough.  Before surgery she weighed 242 pounds.  Initially her Goal weight was to lose 100 pound stopping at 142 pounds.  However, when she reached that goal, it was wasn't enough and she wanted to lose additional weight.  She would like to weigh 115 pounds, but further questioning reveals that would not be sufficient and she would like to lose more.  Her husband has repeatedly told her to stop losing weight, her  previous RD has told her to stop losing weight.  Zemira states she is more concerned with "being skinny than being attractive."  Confirms history of depression, anxiety, and substance abuse.  Denies issues with food prior to bariatric surgery, states she was "just fat."  Further probing reveals history of sneaking foods as a child because parents favored siblings over [place] and Jaconita felt deprived.   Weight history:  Highest weight: 250 lb    Lowest weight: current Most consistent weight: 180-220  What would you like to weigh:115, but further probing admits that will not be low enough How has weight changed in the past year: lost over 100 pounds; 14 pounds+ of which are fat free mass   TANITA BODY COMP RESULTS  10/15/13 06/24/14 09/17/14 09/23/14 10/15/14 10/22/14  BMI (kg/m^2) 37.9 23.5 20.6 20.9 20.1 19.8  Fat Mass (lbs) 105.0 40 23.5 17.5 16.5 18.0  Fat Free Mass (lbs) 122.5 101.5 100 108 104.5 101.0  Total Body Water (lbs) 89.5 74.5 73 79 76.5 74.0  Body fat %   19%   15.1%    Medical Information:   Changes in hair, skin, nails since dramatic weight loss started: hair is coarser and falls out mor easily.  Feels like she breaks out more often.   Chewing/swallowing difficulties: none  Relux or heartburn: occasionally- maybe once a week and some foods get stuck  Trouble with teeth: couple cavities this past year.  (increased risk for oral disease with anorexia)   Constipation, diarrhea: constipation; BM every 3 days and has to strain.  Has taken laxitives in the past.  Liked the weight loss and started to abuse laxitives  No menses due to endometrial ablation   Positive for cold intolerance and fatigue  09/22/14 endocrinology visit revealed BP 98/60 mmHg = LOW DIASTOLIC BP  Pulse 63 bbm  Safe foods include: protein, fats Avoided foods include:breads, rice, pasta, cakes, cookies, chocolate, pizza, etc   What Methods Do You Use To Control Your Weight  (Compensatory behaviors)?           Restricting (calories, fat, carbs): 800 kcal, 60+ g protein, 50 g carb or less, no fat restriction   However, dietary recall reveals patient is Eating ~500 calories per day; <20g CHO; <62g pro; <10g fat  Laxatives: 1 capful 2 times a week  Exercise (what type): walks 10000 steps/day  Denies binging, purging, diuretics  Estimated energy needs per [name], bariatric RD: 1000 kcal 45-60 g CHO 60 g pro 55-60 g fat  Nutrition Diagnosis: NI-1.4 Inadequate energy intake As related to dietary intake restricted in calories, carbohydrates, and fat.  As evidenced by 117 pound wieght loss in 1  year.  Intervention/Goals:  Challenges cognitive distortions about weight/fat/nutrition.  Discussed basal metabolic rate and how much she needs to function properly: per Tanita scan her BMR is 1267 kcal/day.  Talked about choosing her thoughts like  she chooses her clothes: she can choose body positive thoughts or critical thoughts.  Disucsssed discrepancies betwee what she thinks she is eaitng vs what she actually is eating, vs what she actually NEEDS to eat.    Meal plan:    3 meals    1-2 snacks To provide 650 kcal    35 g CHO    70 g pro   15-20 g fat  Shake  Quest bar with 1 carb choice Meat cooked in fat with green vegetable Yogurt Possible nuts   Monitoring and Evaluation: Patient will follow up in 1 weeks.  Will refer back to MD for medical evaluation

## 2014-10-23 ENCOUNTER — Encounter (HOSPITAL_BASED_OUTPATIENT_CLINIC_OR_DEPARTMENT_OTHER): Payer: Self-pay | Admitting: *Deleted

## 2014-10-23 NOTE — Progress Notes (Signed)
Pt had gastric bp 2014-was 250-now 119-wants to go to 110-sees dietician that she id loosing too much-told he to hydrate well and eat 9 hr preop No labs needed

## 2014-10-24 ENCOUNTER — Ambulatory Visit (HOSPITAL_BASED_OUTPATIENT_CLINIC_OR_DEPARTMENT_OTHER)
Admission: RE | Admit: 2014-10-24 | Discharge: 2014-10-24 | Disposition: A | Discharge status: Home / Self Care | Payer: 59 | Source: Ambulatory Visit | Attending: Orthopedic Surgery | Admitting: Orthopedic Surgery

## 2014-10-24 ENCOUNTER — Ambulatory Visit (HOSPITAL_BASED_OUTPATIENT_CLINIC_OR_DEPARTMENT_OTHER): Payer: 59 | Admitting: Anesthesiology

## 2014-10-24 ENCOUNTER — Encounter (HOSPITAL_BASED_OUTPATIENT_CLINIC_OR_DEPARTMENT_OTHER)
Admission: RE | Disposition: A | Discharge status: Home / Self Care | Payer: Self-pay | Source: Ambulatory Visit | Attending: Orthopedic Surgery

## 2014-10-24 ENCOUNTER — Encounter (HOSPITAL_BASED_OUTPATIENT_CLINIC_OR_DEPARTMENT_OTHER): Payer: Self-pay | Admitting: Anesthesiology

## 2014-10-24 DIAGNOSIS — Z87891 Personal history of nicotine dependence: Secondary | ICD-10-CM | POA: Diagnosis not present

## 2014-10-24 DIAGNOSIS — F329 Major depressive disorder, single episode, unspecified: Secondary | ICD-10-CM | POA: Insufficient documentation

## 2014-10-24 DIAGNOSIS — G588 Other specified mononeuropathies: Secondary | ICD-10-CM

## 2014-10-24 DIAGNOSIS — M2142 Flat foot [pes planus] (acquired), left foot: Secondary | ICD-10-CM | POA: Diagnosis not present

## 2014-10-24 DIAGNOSIS — E039 Hypothyroidism, unspecified: Secondary | ICD-10-CM | POA: Diagnosis not present

## 2014-10-24 DIAGNOSIS — I10 Essential (primary) hypertension: Secondary | ICD-10-CM | POA: Diagnosis not present

## 2014-10-24 DIAGNOSIS — G5782 Other specified mononeuropathies of left lower limb: Secondary | ICD-10-CM | POA: Insufficient documentation

## 2014-10-24 DIAGNOSIS — G5732 Lesion of lateral popliteal nerve, left lower limb: Secondary | ICD-10-CM

## 2014-10-24 HISTORY — PX: SUPERFICIAL PERONEAL NERVE RELEASE: SHX6200

## 2014-10-24 HISTORY — DX: Presence of spectacles and contact lenses: Z97.3

## 2014-10-24 HISTORY — DX: Eating disorder, unspecified: F50.9

## 2014-10-24 LAB — POCT HEMOGLOBIN-HEMACUE: Hemoglobin: 14 g/dL (ref 12.0–15.0)

## 2014-10-24 SURGERY — DECOMPRESSION, NERVE, SUPERFICIAL PERONEAL
Anesthesia: General | Site: Leg Lower | Laterality: Left

## 2014-10-24 MED ORDER — MIDAZOLAM HCL 5 MG/5ML IJ SOLN
INTRAMUSCULAR | Status: DC | PRN
  Administered 2014-10-24: 2 mg via INTRAVENOUS

## 2014-10-24 MED ORDER — FENTANYL CITRATE 0.05 MG/ML IJ SOLN: 50.0000 ug | INTRAMUSCULAR | Status: DC | PRN

## 2014-10-24 MED ORDER — LACTATED RINGERS IV SOLN
INTRAVENOUS | Status: DC
  Administered 2014-10-24: 14:00:00 via INTRAVENOUS

## 2014-10-24 MED ORDER — OXYCODONE HCL 5 MG/5ML PO SOLN: 5.0000 mg | Freq: Once | ORAL | Status: DC | PRN

## 2014-10-24 MED ORDER — PROPOFOL 10 MG/ML IV BOLUS
INTRAVENOUS | Status: DC | PRN
  Administered 2014-10-24: 200 mg via INTRAVENOUS

## 2014-10-24 MED ORDER — FENTANYL CITRATE 0.05 MG/ML IJ SOLN
INTRAMUSCULAR | Status: AC
  Filled 2014-10-24: qty 6

## 2014-10-24 MED ORDER — CEFAZOLIN SODIUM-DEXTROSE 2-3 GM-% IV SOLR
INTRAVENOUS | Status: AC
  Filled 2014-10-24: qty 50

## 2014-10-24 MED ORDER — ONDANSETRON HCL 4 MG/2ML IJ SOLN
INTRAMUSCULAR | Status: DC | PRN
  Administered 2014-10-24: 4 mg via INTRAVENOUS

## 2014-10-24 MED ORDER — HYDROMORPHONE HCL 1 MG/ML IJ SOLN: 0.2500 mg | INTRAMUSCULAR | Status: DC | PRN

## 2014-10-24 MED ORDER — OXYCODONE HCL 5 MG PO TABS: 5.0000 mg | ORAL_TABLET | Freq: Once | ORAL | Status: DC | PRN

## 2014-10-24 MED ORDER — DEXAMETHASONE SODIUM PHOSPHATE 4 MG/ML IJ SOLN
INTRAMUSCULAR | Status: DC | PRN
  Administered 2014-10-24: 10 mg via INTRAVENOUS

## 2014-10-24 MED ORDER — BUPIVACAINE HCL (PF) 0.5 % IJ SOLN
INTRAMUSCULAR | Status: DC | PRN
  Administered 2014-10-24: 20 mL

## 2014-10-24 MED ORDER — PROMETHAZINE HCL 25 MG/ML IJ SOLN: 6.2500 mg | INTRAMUSCULAR | Status: DC | PRN

## 2014-10-24 MED ORDER — MIDAZOLAM HCL 2 MG/2ML IJ SOLN: 1.0000 mg | INTRAMUSCULAR | Status: DC | PRN

## 2014-10-24 MED ORDER — MIDAZOLAM HCL 2 MG/2ML IJ SOLN
INTRAMUSCULAR | Status: AC
  Filled 2014-10-24: qty 2

## 2014-10-24 MED ORDER — LIDOCAINE HCL (CARDIAC) 20 MG/ML IV SOLN
INTRAVENOUS | Status: DC | PRN
  Administered 2014-10-24: 50 mg via INTRAVENOUS

## 2014-10-24 MED ORDER — CEFAZOLIN SODIUM-DEXTROSE 2-3 GM-% IV SOLR
INTRAVENOUS | Status: DC | PRN
  Administered 2014-10-24: 2 g via INTRAVENOUS

## 2014-10-24 MED ORDER — FENTANYL CITRATE 0.05 MG/ML IJ SOLN
INTRAMUSCULAR | Status: DC | PRN
  Administered 2014-10-24: 100 ug via INTRAVENOUS

## 2014-10-24 MED ORDER — OXYCODONE-ACETAMINOPHEN 5-325 MG PO TABS: 1.0000 | ORAL_TABLET | Freq: Four times a day (QID) | ORAL | Status: DC | PRN

## 2014-10-24 MED ORDER — BUPIVACAINE HCL (PF) 0.5 % IJ SOLN
INTRAMUSCULAR | Status: AC
  Filled 2014-10-24: qty 30

## 2014-10-24 SURGICAL SUPPLY — 56 items
APL SKNCLS STERI-STRIP NONHPOA (GAUZE/BANDAGES/DRESSINGS) ×2
BANDAGE ELASTIC 4 VELCRO ST LF (GAUZE/BANDAGES/DRESSINGS) ×2 IMPLANT
BANDAGE ESMARK 6X9 LF (GAUZE/BANDAGES/DRESSINGS) ×1 IMPLANT
BENZOIN TINCTURE PRP APPL 2/3 (GAUZE/BANDAGES/DRESSINGS) ×2 IMPLANT
BLADE SURG 10 STRL SS (BLADE) ×1 IMPLANT
BLADE SURG 15 STRL LF DISP TIS (BLADE) ×2 IMPLANT
BLADE SURG 15 STRL SS (BLADE) ×3
BNDG CMPR 9X6 STRL LF SNTH (GAUZE/BANDAGES/DRESSINGS) ×2
BNDG ESMARK 6X9 LF (GAUZE/BANDAGES/DRESSINGS) ×3
CANISTER SUCT 1200ML W/VALVE (MISCELLANEOUS) IMPLANT
COVER SURGICAL LIGHT HANDLE (MISCELLANEOUS) ×2 IMPLANT
CUFF TOURNIQUET SINGLE 24IN (TOURNIQUET CUFF) ×2 IMPLANT
DECANTER SPIKE VIAL GLASS SM (MISCELLANEOUS) IMPLANT
DRAPE EXTREMITY T 121X128X90 (DRAPE) ×2 IMPLANT
DRAPE INCISE IOBAN 66X45 STRL (DRAPES) ×1 IMPLANT
DRAPE U-SHAPE 47X51 STRL (DRAPES) ×3 IMPLANT
DRSG EMULSION OIL 3X3 NADH (GAUZE/BANDAGES/DRESSINGS) IMPLANT
DURAPREP 26ML APPLICATOR (WOUND CARE) ×3 IMPLANT
ELECT REM PT RETURN 9FT ADLT (ELECTROSURGICAL) ×3
ELECTRODE REM PT RTRN 9FT ADLT (ELECTROSURGICAL) ×2 IMPLANT
GAUZE SPONGE 4X4 12PLY STRL (GAUZE/BANDAGES/DRESSINGS) ×3 IMPLANT
GAUZE XEROFORM 1X8 LF (GAUZE/BANDAGES/DRESSINGS) ×2 IMPLANT
GLOVE BIO SURGEON STRL SZ7 (GLOVE) ×2 IMPLANT
GLOVE BIOGEL PI IND STRL 8 (GLOVE) ×4 IMPLANT
GLOVE BIOGEL PI INDICATOR 8 (GLOVE) ×2
GLOVE ECLIPSE 7.5 STRL STRAW (GLOVE) ×6 IMPLANT
GOWN STRL REUS W/ TWL LRG LVL3 (GOWN DISPOSABLE) ×2 IMPLANT
GOWN STRL REUS W/ TWL XL LVL3 (GOWN DISPOSABLE) ×2 IMPLANT
GOWN STRL REUS W/TWL LRG LVL3 (GOWN DISPOSABLE) ×3
GOWN STRL REUS W/TWL XL LVL3 (GOWN DISPOSABLE) ×6 IMPLANT
NS IRRIG 1000ML POUR BTL (IV SOLUTION) ×3 IMPLANT
PACK ARTHROSCOPY DSU (CUSTOM PROCEDURE TRAY) ×3 IMPLANT
PACK BASIN DAY SURGERY FS (CUSTOM PROCEDURE TRAY) ×3 IMPLANT
PAD CAST 3X4 CTTN HI CHSV (CAST SUPPLIES) ×1 IMPLANT
PADDING CAST COTTON 3X4 STRL (CAST SUPPLIES) ×3
PENCIL BUTTON HOLSTER BLD 10FT (ELECTRODE) ×3 IMPLANT
SPONGE GAUZE 4X4 12PLY STER LF (GAUZE/BANDAGES/DRESSINGS) ×1 IMPLANT
SPONGE LAP 18X18 X RAY DECT (DISPOSABLE) ×1 IMPLANT
STAPLER VISISTAT 35W (STAPLE) IMPLANT
STRIP CLOSURE SKIN 1/2X4 (GAUZE/BANDAGES/DRESSINGS) ×3 IMPLANT
SUT BONE WAX W31G (SUTURE) ×1 IMPLANT
SUT ETHILON 4 0 PS 2 18 (SUTURE) ×2 IMPLANT
SUT MNCRL AB 3-0 PS2 18 (SUTURE) ×3 IMPLANT
SUT VIC AB 0 CT1 27 (SUTURE)
SUT VIC AB 0 CT1 27XBRD ANBCTR (SUTURE) ×1 IMPLANT
SUT VIC AB 1 CT1 27 (SUTURE)
SUT VIC AB 1 CT1 27XBRD ANBCTR (SUTURE) ×1 IMPLANT
SUT VIC AB 2-0 SH 27 (SUTURE)
SUT VIC AB 2-0 SH 27XBRD (SUTURE) ×1 IMPLANT
SUT VICRYL 4-0 PS2 18IN ABS (SUTURE) ×2 IMPLANT
SYR BULB 3OZ (MISCELLANEOUS) ×3 IMPLANT
SYR CONTROL 10ML LL (SYRINGE) ×1 IMPLANT
TOWEL OR NON WOVEN STRL DISP B (DISPOSABLE) ×1 IMPLANT
TUBE CONNECTING 20X1/4 (TUBING) ×1 IMPLANT
UNDERPAD 30X30 INCONTINENT (UNDERPADS AND DIAPERS) ×3 IMPLANT
YANKAUER SUCT BULB TIP NO VENT (SUCTIONS) ×1 IMPLANT

## 2014-10-24 NOTE — Discharge Instructions (Signed)

## 2014-10-24 NOTE — H&P (Signed)
PREOPERATIVE H&P  Chief Complaint: l foot radiating pain and weakness  HPI: Kimberly Stanley is a 48 y.o. female who presents for evaluation of l foot weakness and radiating pain. It has been present for greater than 6 months and has been worsening. She has failed conservative measures. She has a positive EMG nerve conduction for peroneal nerve compression at the fibular heads Pain is rated as moderate.  Past Medical History  Diagnosis Date  . Thyroid disease   . Hypertension   . Hypothyroidism   . Depression   . Eating disorder     loose alot of weight since bypass  . Wears contact lenses    Past Surgical History  Procedure Laterality Date  . Cholecystectomy    . Dilation and curettage of uterus    . Tubal ligation    . 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 Marital Status: Married    Spouse Name: N/A    Number of Children: N/A  . Years of Education: N/A   Social History Main Topics  . Smoking status: Former Smoker -- 0.50 packs/day for 15 years    Types: Cigarettes    Quit date: 07/05/2013  . Smokeless tobacco: Never Used  . Alcohol Use: No  . Drug Use: Yes    Special: "Crack" cocaine     Comment: MAY 2013 LAST DRUG USE  . Sexual Activity: Yes    Birth Control/ Protection: Other-see comments     Comment: Novasure   Other Topics Concern  . None   Social History Narrative   Family History  Problem Relation Age of Onset  . Cancer Mother     breast  . Cancer Paternal Uncle     panctratic  . Arthritis Other   . Cancer Other   . Hyperlipidemia Other   . Hypertension Other   . Obesity Other    No Known Allergies Prior to Admission medications   Medication Sig Start Date End Date Taking? Authorizing Provider  busPIRone (BUSPAR) 15 MG tablet Take 30 mg by  mouth 2 (two) times daily.  06/16/13  Yes Historical Provider, MD  Calcium Citrate-Vitamin D (CALCIUM CITRATE + PO) Take 1 capsule by mouth.   Yes Historical Provider, MD  FLUoxetine (PROZAC) 20 MG tablet  07/29/14  Yes Historical Provider, MD  lamoTRIgine (LAMICTAL) 150 MG tablet Take 70 mg by mouth at bedtime.  06/04/13  Yes Historical Provider, MD  Multiple Vitamin (MULTIVITAMIN) tablet Take 1 tablet by mouth daily.   Yes Historical Provider, MD  NASCOBAL 500 MCG/0.1ML SOLN  09/30/13  Yes Historical Provider, MD  pantoprazole (PROTONIX) 40 MG tablet Take 1 tablet (40 mg total) by mouth daily. 10/03/13  Yes Madilyn Hook, DO  TIROSINT 125 MCG CAPS Take 1 capsule (125 mcg total) by mouth daily before breakfast. 09/22/14  Yes Philemon Kingdom, MD  traZODone (DESYREL) 50 MG tablet Take 50 mg by mouth at bedtime.  06/16/13  Yes Historical Provider, MD     Positive ROS: none  All other systems have been reviewed and were otherwise negative with the exception of those mentioned in the HPI and as above.  Physical Exam: Filed Vitals:   10/24/14 1328  BP: 102/63  Pulse: 54  Temp: 97.8 F (36.6 C)  Resp: 20  General: Alert, no acute distress Cardiovascular: No pedal edema Respiratory: No cyanosis, no use of accessory musculature GI: No organomegaly, abdomen is soft and non-tender Skin: No lesions in the area of chief complaint Neurologic: Sensation intact distally Psychiatric: Patient is competent for consent with normal mood and affect Lymphatic: No axillary or cervical lymphadenopathy  MUSCULOSKELETAL: l leg: +tinnells at knee// +weaknesws of dorsi-flexors foot  Assessment/Plan: peroneal nerve compression at left fibular head Plan for Procedure(s): SUPERFICIAL PERONEAL NERVE RELEASE LEFT  The risks benefits and alternatives were discussed with the patient including but not limited to the risks of nonoperative treatment, versus surgical intervention including infection, bleeding,  nerve injury, malunion, nonunion, hardware prominence, hardware failure, need for hardware removal, blood clots, cardiopulmonary complications, morbidity, mortality, among others, and they were willing to proceed.  Predicted outcome is good, although there will be at least a six to nine month expected recovery.  Alta Corning, MD 10/24/2014 2:20 PM

## 2014-10-24 NOTE — Anesthesia Postprocedure Evaluation (Signed)
Anesthesia Post Note  Patient: Kimberly Stanley  Procedure(s) Performed: Procedure(s) (LRB): SUPERFICIAL PERONEAL NERVE RELEASE LEFT (Left)  Anesthesia type: general  Patient location: PACU  Post pain: Pain level controlled  Post assessment: Patient's Cardiovascular Status Stable  Last Vitals:  Filed Vitals:   10/24/14 1736  BP: 141/72  Pulse: 52  Temp: 36.6 C  Resp: 16    Post vital signs: Reviewed and stable  Level of consciousness: sedated  Complications: No apparent anesthesia complications

## 2014-10-24 NOTE — Anesthesia Procedure Notes (Signed)
Procedure Name: LMA Insertion Performed by: Terrance Mass Pre-anesthesia Checklist: Patient identified, Timeout performed, Emergency Drugs available, Suction available and Patient being monitored Patient Re-evaluated:Patient Re-evaluated prior to inductionOxygen Delivery Method: Circle system utilized Preoxygenation: Pre-oxygenation with 100% oxygen Intubation Type: IV induction Ventilation: Mask ventilation without difficulty LMA: LMA inserted LMA Size: 4.0 Tube type: Oral Placement Confirmation: positive ETCO2 Tube secured with: Tape Dental Injury: Teeth and Oropharynx as per pre-operative assessment

## 2014-10-24 NOTE — Anesthesia Preprocedure Evaluation (Addendum)
Anesthesia Evaluation  Patient identified by MRN, date of birth, ID band Patient awake    Reviewed: Allergy & Precautions, H&P , NPO status , Patient's Chart, lab work & pertinent test results  Airway Mallampati: II  TM Distance: >3 FB Neck ROM: Full    Dental   Pulmonary former smoker,          Cardiovascular     Neuro/Psych Depression    GI/Hepatic negative GI ROS, Neg liver ROS,   Endo/Other  Hypothyroidism   Renal/GU negative Renal ROS     Musculoskeletal   Abdominal   Peds  Hematology negative hematology ROS (+)   Anesthesia Other Findings   Reproductive/Obstetrics                           Anesthesia Physical Anesthesia Plan  ASA: II  Anesthesia Plan: General   Post-op Pain Management:    Induction: Intravenous  Airway Management Planned: LMA  Additional Equipment:   Intra-op Plan:   Post-operative Plan: Extubation in OR  Informed Consent: I have reviewed the patients History and Physical, chart, labs and discussed the procedure including the risks, benefits and alternatives for the proposed anesthesia with the patient or authorized representative who has indicated his/her understanding and acceptance.     Plan Discussed with: CRNA and Surgeon  Anesthesia Plan Comments:         Anesthesia Quick Evaluation

## 2014-10-24 NOTE — Transfer of Care (Signed)
Immediate Anesthesia Transfer of Care Note  Patient: Kimberly Stanley  Procedure(s) Performed: Procedure(s): SUPERFICIAL PERONEAL NERVE RELEASE LEFT (Left)  Patient Location: PACU  Anesthesia Type:General  Level of Consciousness: awake and sedated  Airway & Oxygen Therapy: Patient Spontanous Breathing and Patient connected to face mask oxygen  Post-op Assessment: Report given to PACU RN and Post -op Vital signs reviewed and stable  Post vital signs: Reviewed and stable  Complications: No apparent anesthesia complications

## 2014-10-27 ENCOUNTER — Encounter (HOSPITAL_BASED_OUTPATIENT_CLINIC_OR_DEPARTMENT_OTHER): Payer: Self-pay | Admitting: Orthopedic Surgery

## 2014-10-27 NOTE — Brief Op Note (Signed)
10/24/2014  12:22 PM  PATIENT:  Kimberly Stanley  48 y.o. female  PRE-OPERATIVE DIAGNOSIS:  peroneal nerve compression at left fibular head  POST-OPERATIVE DIAGNOSIS:  peroneal nerve compression at left fibular head  PROCEDURE:  Procedure(s): SUPERFICIAL PERONEAL NERVE RELEASE LEFT (Left)  SURGEON:  Surgeon(s) and Role:    * Alta Corning, MD - Primary  PHYSICIAN ASSISTANT:   ASSISTANTS: bethune   ANESTHESIA:   general  EBL:     BLOOD ADMINISTERED:none  DRAINS: none   LOCAL MEDICATIONS USED:  MARCAINE     SPECIMEN:  No Specimen  DISPOSITION OF SPECIMEN:  N/A  COUNTS:  YES  TOURNIQUET:   Total Tourniquet Time Documented: Thigh (Left) - 23 minutes Total: Thigh (Left) - 23 minutes   DICTATION: .Other Dictation: Dictation Number [id number]  PLAN OF CARE: Discharge to home after PACU  PATIENT DISPOSITION:  PACU - hemodynamically stable.   Delay start of Pharmacological VTE agent (>24hrs) due to surgical blood loss or risk of bleeding: no

## 2014-10-28 ENCOUNTER — Ambulatory Visit: Payer: 59 | Admitting: Dietician

## 2014-10-28 NOTE — Op Note (Signed)
NAMESHASHA, BUCHBINDER                ACCOUNT NO.:  [id number]  MEDICAL RECORD NO.:  [id number]  LOCATION:                                 FACILITY:  PHYSICIAN:  Alta Corning, M.D.   DATE OF BIRTH:  [birth year]  DATE OF PROCEDURE:  10/24/2014 DATE OF DISCHARGE:                              OPERATIVE REPORT   PREOPERATIVE DIAGNOSIS:  Peroneal nerve compression, left.  POSTOPERATIVE DIAGNOSIS:  Peroneal nerve compression, left.  PRINCIPAL PROCEDURE:  Left peroneal nerve decompression.  SURGEON:  Alta Corning, M.D.  ASSISTANT:  Gary Fleet, PA-C.  ANESTHESIA:  General.  BRIEF HISTORY:  Ms. Fagin is a 48 year old female with a long history of significant complaints of numbness and tingling radiating down the left leg.  She is also having a bit of a drop foot.  We talked to her about treatment options and felt that decompression was appropriate. She was brought to the operating room for this procedure.  DESCRIPTION OF PROCEDURE:  The patient was brought to the operating room.  After adequate anesthesia was obtained by general anesthetic, the patient was brought to the operating table.  The left leg was prepped and draped in usual sterile fashion.  Following this, the leg was exsanguinated and blood pressure tourniquet was inflated to 300 mmHg. Following this, a curved incision was made over the area of the proximal fibula, subcutaneous tissue down the level of the muscle and nerve. Nerve was identified as it courses around the fibula.  It was noted to be somewhat diminutive.  We tracked it all the way up into the popliteal space where it was decompressed all that area and then released it into the muscle area and down the leg.  Once that was done, there was also a tight band posterior to the nerve, so we carefully gently mobilized the nerve and then released this fascia over the muscle band below which I thought was also very nice for the decompression.  Followed it down  into the muscle unit and followed it towards start of the branch and decompressed that whole way and were very satisfied with that whole situation.  Once this was done, the wound was copiously irrigated and suctioned dry.  The wound was closed in layers with subcuticular closure.  Benzoin and Steri-Strips were applied.  Sterile compressive dressing was applied.  The patient was taken to the recovery room and she was noted to be in satisfactory condition.  Estimated blood loss for the procedure was minimal.     Alta Corning, M.D.     Corliss Skains  D:  10/27/2014  T:  10/27/2014  Job:  [id number]

## 2014-10-29 ENCOUNTER — Telehealth: Payer: Self-pay | Admitting: *Deleted

## 2014-10-29 ENCOUNTER — Encounter: Payer: 59 | Admitting: *Deleted

## 2014-10-29 VITALS — Wt 118.5 lb

## 2014-10-29 DIAGNOSIS — E669 Obesity, unspecified: Secondary | ICD-10-CM | POA: Diagnosis not present

## 2014-10-29 DIAGNOSIS — E441 Mild protein-calorie malnutrition: Secondary | ICD-10-CM

## 2014-10-29 NOTE — Telephone Encounter (Signed)
All you need to do is what you were doing a month ago.  Just get back to what you did then: shake for breakfast, bar for lunch, normal dinner, and yogurt.  That's the minimum.  You can do that because you did do that.  You did that and lost weight doing that.  The extra fruit and nuts are optional.  I'd love for you to eat them, but they're options.  the goal is what you were doing.  I know you can do that because you used to do that every day, no problem.    I'm sorry if I've put you in a funk.  The intention was to try and impress upon you the seriousness here.  You have something called "bradycardia" now.  That is a heart rate below 60 beats per minute.  Your last heart rate was 52 on Friday.  This is serious.  I am not trying to get you to gain a bunch of weight.  Right now I'm not trying to get you to gain any weight.  I'm trying to get you to eat what you used to eat on a daily basis.    Don't eat the cookies if you can't do it without compensation. Don't do anything you don't think you can do.  All I'm asking is what you have already done.  You've done it... you can do it again.Marland KitchenMarland KitchenI know you can.  You cannot trust your own judgement right now.  Your starved brain won't let you make sound decisions.  Please just follow the meal plan of shake, bar, dinner, and yogurt.  You can do that.    When was the last time you saw Wilfred Lacy?  When if your next visit?  If it's not for a while, I would reach out to her.   She might not be available until after Christmas, bear that in mind.    Your brain is starving.  Your body is breaking down its own muscle to feed your brain.  Your body is in desperate need of energy.  Give yourself permission to honor that need.  It's not all or nothing.  It's not black and white.  It's give your body something to survive on.  Something that you already have given it in the past Noble.  You won't gain with that meal plan because you didn't gain on it before.      -----Original Message----- From: [email or web address] [[email or web address]]  Sent: Wednesday, October 29, 2014 2:51 PM To: Ozzie Hoyle [email or web address]> Subject: Re: Books  You have really put me in a funk today. I don't know if I can do what you ask. I can eat the cookies but I don't know if I can do it without compensating. I don't know if I can try to stop losing. Should I just see Wilfred Lacy?  I just don't know what to do.   > On Oct 29, 2014, at 2:11 PM, Edman Circle, Mickel [email or web address]> wrote: >  > You are very welcome >  > -----Original Message----- > From: [email or web address] [[email or web address]]  > Sent: Wednesday, October 29, 2014 2:06 PM > To: Ozzie Hoyle @[email or web address]> > Subject: Re: Books >  > Thank you >   Angela Nevin Christmas! >  >> On Oct 29, 2014, at 1:52 PM, Edman Circle, Mickel Baas @[email or web address]> wrote: >>  >> 8 keys to recovery from an eating disorder By Louann Sjogren >>  >> My personal favorite is Life without Ed by Glennon Hamilton >>  >> -----  Original Message----- >> From: [email or web address] [[email or web address]] >> Sent: Wednesday, October 29, 2014 1:48 PM >> To: Ozzie Hoyle @[email or web address]> >> Subject: Re: Books >>  >> So no suggestions?  >>  >>> On Oct 29, 2014, at 1:36 PM, Reavis, Rico [email or web address] wrote: >>>  >>> Yeah.  There are books, but they are for recovery from an eating disorder.  "the first step towards recovery is admitting you have  a problem", right? >>>  >>> :-) >>>  >>> -----Original Message----- >>> From: [email or web address] [[email or web address]] >>> Sent: Wednesday, October 29, 2014 10:49 AM >>> To: Ozzie Hoyle @[email or web address]> >>> Subject: Books >>>  >>> Were there books?  You had a couple out. I

## 2014-10-29 NOTE — Telephone Encounter (Signed)
Spoke with receptionist who informed RD that Dr. Dema Severin is out of the office today.  RD asked for receptionist to please leave message for Dr. Dema Severin that this RD is very concerned about this patient's eating disorder and would like to consult with Dr. Dema Severin.  Receptionist informed this RD that Dr. Dema Severin will be back in the office tomorrow and will give her the message

## 2014-10-29 NOTE — Progress Notes (Signed)
Appointment start time: 0800 Appointment end time: 0900  Patient was seen on 10/29/14 for nutrition counseling pertaining to disordered eating  Primary care MD: Dr. Harlan Stains Therapist: Esperanza Heir Any other medical team members: Dr. Cruzita Lederer endocrinology, Dr. Hassell Done surgeon, [name] bariatric RD  Assessment:  Weight: 118 Height: 65 Expected body weight: 144 lb (25% body fat) Percent expected body weight: 81.9% 12.8% body fat and 25% is expected Pulse is 68  Kimberly Stanley continues to lose weight.  She states her new goal is 110 lb.  She keeps lowering her weight goal.  Even though she told her therapist that her ultimate goal was 115, she told me it was 112, now she has lowered it to 110.  As per her eating disorder, it will never be enough.  The goal will keep decreasing until she dies.  This Probation officer is very concerned.  Her boy fat percentage continues to drop; currently at 12.8% and the recommended body fat percentage for a woman is 25%.  She continues to lose fat free mass, not just fat.  She has a fair amount of lose skin from her dramatic weight loss surgery.  She thinks this is fat she needs to lose, but it's skin that can never be lost.  If she did not have that extra skin, she would most likely weigh between 105-110 pounds.  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  She has no menses due to ablation, so it's hard to find a benchmark to convince her that her weight is too low.  She does complain of low energy, nerve pain, cold sensitivity, and constipation.  She has not had a medical exam in awhile.  I am curious about vital signs, especially since her most recent pulse rate is 52 bbm.  According to the protocol for the pediatric ward at [facility], grounds for admission for an eating disorder include heart rate less than 45 bbm and Kimberly Stanley is getting very close to that number.  She  does have bradycardia, however, as her heart rate is less than 60  Previous psych screening EAT-26 score significant >20 Patient score 45   Dietary assessment: A typical day consists of 3 meals and 1-2 snacks  monday- B: PB2 (2 tbsp) L: cookie and cream protein bar (quest bar) S: coffee with creamer D: 1 hamburger steak with onions and mushroom; pizza; chicken with vegetable S: dannon lite and fit yogurt  Total intake: ~600 kcal 70 g protein 20 g fat 30-40 g carb  Fluid intake: water, Powerade zero, hot tea, 1 cup regular coffee with FF SF creamer (approved by MD) (64 oz per patient)    [facility]ANITA BODY COMP RESULTS  10/15/13 06/24/14 09/17/14 09/23/14 10/15/14 10/22/14 10/29/14  BMI (kg/m^2) 37.9 23.5 20.6 20.9 20.1 19.8 19.7  Fat Mass (lbs) 105.0 40 23.5 17.5 16.5 18.0 15  Fat Free Mass (lbs) 122.5 101.5 100 108 104.5 101.0 103.5  Total Body Water (lbs) 89.5 74.5 73 79 76.5 74.0 76  Body fat %   19%   15.1% 12.8     Estimated energy needs per [name], bariatric RD: 1000 kcal 45-60 g CHO 60 g pro 55-60 g fat  Nutrition Diagnosis: NI-1.4 Inadequate energy intake As related to dietary intake restricted in calories, carbohydrates, and fat.  As evidenced by 117 pound wieght loss in 1  year.  Intervention/Goals:  Stressed how serious  her medical situation is.  Tried to impress upon Kimberly Stanley the need to make changes to improve her health.  Told her it will soon be unethical for me to continue working with her if she remains noncompliant and continues to lose weight.  Reviewed medical data: bradycardia, very low % body fat, continued loss of fat free mass.  Emphasized need for carbohydrates for brain function.  Challenged cognitive distortions.  Stressed need to trust dietitian and increase her intake or she will eventually become too weak to function and possibly diet  Meal plan:    3 meals    1-2 snacks To provide 650 kcal    35 g CHO    70 g pro    15-20 g fat  Shake  Quest bar with 1 carb choice Meat cooked in fat with green vegetable Yogurt Possible nuts   Monitoring and Evaluation: Patient will follow up in 1-2 weeks.  Will refer back to MD for medical evaluation

## 2014-10-30 ENCOUNTER — Telehealth: Payer: Self-pay | Admitting: *Deleted

## 2014-10-30 NOTE — Telephone Encounter (Signed)
Asked for return phone call

## 2014-10-30 NOTE — Telephone Encounter (Signed)
Talked with Dr. Harlan Stains about Lavina's medical state.  Dr. Dema Severin agrees that Selin is loosing too weight and she is experiencing disordered eating thoughts and behaviors.  Dr. Dema Severin referred this RD to the patient's psychiatrist, Dr. Eulogio Ditch

## 2014-11-05 ENCOUNTER — Encounter: Payer: 59 | Admitting: *Deleted

## 2014-11-05 VITALS — Wt 119.5 lb

## 2014-11-05 DIAGNOSIS — E441 Mild protein-calorie malnutrition: Secondary | ICD-10-CM

## 2014-11-05 DIAGNOSIS — E669 Obesity, unspecified: Secondary | ICD-10-CM | POA: Diagnosis not present

## 2014-11-05 NOTE — Progress Notes (Signed)
Appointment start time: 0800 Appointment end time: 0900  Patient was seen on 11/05/14 for nutrition counseling pertaining to disordered eating  Primary care MD: Dr. Harlan Stains Therapist: Esperanza Stanley Any other medical team members: Dr. Cruzita Lederer endocrinology, Dr. Hassell Done surgeon, Kimberly Stanley bariatric RD, Dr. Farris Has, psychiatry  Assessment:   Weight: 119 Height: 65 Expected body weight: 144 lb (25% body fat) Percent expected body weight: 81.9% 8.3% body fat and 25% is expected Pulse is 88  Kimberly Stanley continues to lose weight. The number on the scale went up some over Christmas, but that was all water weight, as evidenced by Kimberly Stanley body composition scan.  She continues to lose fat mass and now is ~ 10 pound of fat on her body.  She states she tried to eat more carbs over Christmas: she had pizza one night, cookies sometimes, and bites of doughnut.  She nibbled a lot over the past week and is sure she gained weight.  However, Kimberly Stanley shows continued loss.  Her daughter brought over a cheese ball and Kimberly Stanley ate some with crackers, but use that as her dinner and did not eat anything else.  She continues to struggle with the idea of her having an eating disorder; she thinks she weighs too much to have an eating disorder, despite her EAT-26 score and despite meeting the diagnostic criteria for anorexia nervosa. I have spoken with her PCP, Dr. Dema Severin, who agrees she is not healthy and have been referred to Kimberly Stanley's psychiatrist, Dr. Gus Height.  I have left a voicemail for Dr. Gus Height, as her office is closed for the holidays. While Kimberly Stanley does not think she has an eating disorder, she does believe her relationship with food is not normal, that food causes her anxiety, and that she is consumed with thoughts of food.  She would like to be free from those thoughts and she thinks she will relax when she reaches 110 pounds.  She has started reading Life Without Ed by Kimberly Stanley and likes the idea of seperating her  thoughts from eating disorder thoughts   Previous psych screening EAT-26 score significant >20 Patient score 45   Dietary assessment: A typical day consists of 3 meals and 1-2 snacks  Yesterday: Shake  Quest bar  Meat cooked in fat with green vegetable Yogurt  Total intake: ~600 kcal 70 g protein 20 g fat 30-40 g carb  Fluid intake: water, Powerade zero, hot tea, 1 cup regular coffee with FF SF creamer (approved by MD) (64 oz per patient)    Kimberly Stanley BODY COMP RESULTS  10/15/13 06/24/14 09/17/14 09/23/14 10/15/14 10/22/14 10/29/14 11/05/14  BMI (kg/m^2) 37.9 23.5 20.6 20.9 20.1 19.8 19.7 19.9  Fat Mass (lbs) 105.0 40 23.5 17.5 16.5 18.0 15 10  Fat Free Mass (lbs) 122.5 101.5 100 108 104.5 101.0 103.5 109.5  Total Body Water (lbs) 89.5 74.5 73 79 76.5 74.0 76 80  Body fat %   19%   15.1% 12.8% 8.3%     Estimated energy needs per Kimberly Stanley, bariatric RD: 1000 kcal 45-60 g CHO 60 g pro 55-60 g fat  Nutrition Diagnosis: NI-1.4 Inadequate energy intake As related to dietary intake restricted in calories, carbohydrates, and fat.  As evidenced by 130 pound wieght loss in 1  year.  Intervention/Goals:  Challenged cognitive distortions and eating disorder thoughts.  She was sure she had gained weight over Christmas, but she hasn't.  She wants to start exercising.  I am not comfortable with that and told her  not to exercise.  She needs to eat minimum 900 calories/day for 1 month before I will allow exercise Nutrition recommendations are as they have always been, follow the minimum diet you used to follow: shake, bar, dinner, and yogurt.  She still lost weight on that diet, but it was a slower pace of weight loss and it's more than she has been eating lately.  Meal plan:    3 meals    1-2 snacks To provide 650 kcal    35 g CHO    70 g pro   15-20 g fat  Shake  Quest bar  Meat cooked in fat with green vegetable Yogurt    Monitoring and  Evaluation: Patient will follow up in 2 weeks.

## 2014-11-06 ENCOUNTER — Telehealth: Payer: Self-pay | Admitting: *Deleted

## 2014-11-06 NOTE — Telephone Encounter (Signed)
The answer is "no."  Abusing diuretics causes electrolyte imbalances.

## 2014-11-11 ENCOUNTER — Telehealth: Payer: Self-pay | Admitting: *Deleted

## 2014-11-11 NOTE — Telephone Encounter (Signed)
Received phone call from Kimberly Stanley, Kimberly Stanley's psychiatrist.  Kimberly Stanley saw this provider on 10/13/14 and she has her prozac dose increased to 40 mg from 20.  Provider acknowledged eating disorder and reiterated positive health messages.  She suggested Kimberly Stanley remove her scale from her home (which Kimberly Stanley has not done) and to take a picture of herself for some objective comparison.  She thinks Kimberly Stanley's exercise is excessive {Kimberly Stanley is supposed to be on exercise restriction and is therefore noncompliant).  Kimberly Stanley is to see this provider after 6 weeks (11/24/14).  Treatment team is united that Kimberly Stanley is not doing well, she needs to take ownership of her eating disorder and be complaint with her treatment.  This provider will assess how the increase in prozac has affected her and if Kimberly Stanley does not improve, will demand she bring her husband to treatment team sessions so he can know how she is doing and how she needs to improve.    I will contact therapist, Kimberly Stanley, to make sure she's on the same page.

## 2014-11-11 NOTE — Telephone Encounter (Signed)
Patient wants to know what is considered purging; just making yourself throw up?   I will not give a complete list of purging methods that patients can use as ideas.  I responded SIV is considered purging, but also anything that is done as a method of compensation: restriction and exercising (which she has previously already admitted to).  I also told her I spoke with her psychiatrist and we both want her to bring her husband to her next appointment so he can know what's going on

## 2014-11-13 ENCOUNTER — Encounter: Payer: Self-pay | Admitting: *Deleted

## 2014-11-13 ENCOUNTER — Telehealth: Payer: Self-pay | Admitting: *Deleted

## 2014-11-13 NOTE — Telephone Encounter (Signed)
Spoke with client on phone.  Told her RD and therapist agree after receiving her email that she needs a higher level of care. Recommended inpatient treatment.  Patient was surprised at this news.  She had several questions.  RD explained what inpatient care is for and hwo it benefits patients.  RD told patient that she is not doing well in outpatient therapy and that treatment team (RD, MD, psychiatrist, and therapist) all agree she is not doing well.  RD reiterated previous messages that patient's continued weight loss and vital signs are indicative of needing higher level of care and that her noncompliance with meal plan is grounds for needing higher level of care.  Patient said she would think about it and bring her husband with her to next week's nutrition appointment

## 2014-11-13 NOTE — Telephone Encounter (Signed)
Received voicemail from therapist after patient email Therapist feels email is cry for help and opportunity to suggest higher level of care.  Therapist asked RD to handle the response to patient

## 2014-11-13 NOTE — Telephone Encounter (Signed)
Received following email from patient.  email sent to RD, therapist Esperanza Heir) and husband Orene Desanctis)  "I have a feeling I may be wasting your time. I don't know how to eat and not feel bad. Or eat and not compensate. I feel like I have to be below "normal" to be truly a thin person. When the scale goes up I feel awful. Not sure where to go from here. "

## 2014-11-17 ENCOUNTER — Telehealth: Payer: Self-pay | Admitting: *Deleted

## 2014-11-17 NOTE — Telephone Encounter (Signed)
"    I'm telling you this so you have all the facts when trying to help me to make sure we are on the right track and that I need the help you're thinking.    Yesterday I was in full binge mode. At 5:00 I had pizza again and then spent the whole evening eating. Chocolate candies, cheese and crackers, lots of chocolate. I am not exaggerating when I say I ate 2500-3000 more calories yesterday.     I just kept thinking I blew it anyway so I might as well keep eating. I knew I was full but my brain wanted more.     I'm hoping I didn't ruin my surgery.     I just wanted you to know all the details because you may be worried about something that is not accurate, meaning the ed diagnosis. I am apparently able to eat.        I will see you Wednesday           Have a great day!             Kimberly Stanley"

## 2014-11-19 ENCOUNTER — Encounter: Payer: 59 | Attending: Surgery | Admitting: *Deleted

## 2014-11-19 ENCOUNTER — Telehealth: Payer: Self-pay | Admitting: *Deleted

## 2014-11-19 ENCOUNTER — Encounter: Payer: Self-pay | Admitting: *Deleted

## 2014-11-19 VITALS — Wt 114.0 lb

## 2014-11-19 DIAGNOSIS — Z6820 Body mass index (BMI) 20.0-20.9, adult: Secondary | ICD-10-CM | POA: Diagnosis not present

## 2014-11-19 DIAGNOSIS — Z713 Dietary counseling and surveillance: Secondary | ICD-10-CM | POA: Insufficient documentation

## 2014-11-19 DIAGNOSIS — E669 Obesity, unspecified: Secondary | ICD-10-CM | POA: Insufficient documentation

## 2014-11-19 DIAGNOSIS — F509 Eating disorder, unspecified: Secondary | ICD-10-CM

## 2014-11-19 DIAGNOSIS — E441 Mild protein-calorie malnutrition: Secondary | ICD-10-CM

## 2014-11-19 DIAGNOSIS — Z9884 Bariatric surgery status: Secondary | ICD-10-CM | POA: Insufficient documentation

## 2014-11-19 NOTE — Telephone Encounter (Signed)
Received email from patient's husband Orene Desanctis about what can he do to help.   My response:  "Atkins so much for coming.  I think you being there today really made this real for Tanique (I hope).  I think you can play a very important role in reiterating the messages from her treatment team at home when she is struggling. Her therapist and I strongly believe she needs a higher level of care.  I could encourage you both to consider that.  Go ahead and check with your insurance company and I will do some more research into treatment centers that I think would be a good fit.  She has been working with a dietitian in an outpatient setting for months. Before me, her dietitian [name] kept trying to encourage her to eat more and slow down with the weight loss.  So even though it's only been 2 months with me, this has been an ongoing issue for a while and she hasn't been able to make steps towards improving her health.  Unless there's a miracle, I don't think she will get better unless she goes in treatment inpatient.  I'd like to make figuring out a way to get in treatment a top priority."

## 2014-11-19 NOTE — Progress Notes (Signed)
Appointment start time: 0800 Appointment end time: 0900  Patient was seen on 11/19/14 for nutrition counseling pertaining to disordered eating.  She is accompanied today by her husband, Orene Desanctis  Primary care MD: Dr. Harlan Stains Therapist: Esperanza Heir Any other medical team members: Dr. Farris Has, psychiatry  Assessment:   Weight: 114 Height: 65 Expected body weight: 144 lb (25% body fat) Percent expected body weight: 79.1%%  Raynisha continues to lose weight. She has dropped her goal weight again: it is now less than 110 lb to "give me wiggle room."  RD asked her again what her impressions are of her psychological state, ie if she thinks she has an eating disorder.  Nija states she continues to struggle with that concept and that she doesn't think she does have an eating disorder because "those people eat less than 500 calories/day and this past 2 weeks I have had pizza some and bread at [facility] and I ate too much so I can't have an eating disorder."  I had received an email from Crane earlier this week that she had experienced a binge episode and that therefore meant she didn't have an eating disorder since she could eat. I asked her husband, Orene Desanctis, what his impressions were.  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  She no longer drinks her shake or eats her bar and eats nothing until the evenings when she has started to engage in more binge-like eating.  She has also started exercise against my instructions  Previous psych screening on 09/23/14 EAT-26 score significant >20 Patient score 45   Dietary assessment: A typical day consists of 1 meals and 0 snacks  Yesterday: Have not been eating shake or bar Has been eating more at dinner (pizza, chocolate, bread at [facility])  Total intake: ~500 kcal   Fluid  intake: water, Powerade zero, hot tea, 1 cup regular coffee with FF SF creamer (approved by MD) (64 oz per patient)    TANITA BODY COMP RESULTS  10/15/13 06/24/14 09/17/14 09/23/14 10/15/14 10/22/14 10/29/14 11/05/14 11/20/14  BMI (kg/m^2) 37.9 23.5 20.6 20.9 20.1 19.8 19.7 19.9 19  Fat Mass (lbs) 105.0 40 23.5 17.5 16.5 18.0 15 10 14.5  Fat Free Mass (lbs) 122.5 101.5 100 108 104.5 101.0 103.5 109.5 99.5  Total Body Water (lbs) 89.5 74.5 73 79 76.5 74.0 76 80 73  Body fat %   19%   15.1% 12.8% 8.3% 12.7     Estimated energy needs per [name], bariatric RD: 1000 kcal 45-60 g CHO 60 g pro 55-60 g fat  Nutrition Diagnosis: NI-1.4 Inadequate energy intake As related to dietary intake restricted in calories, carbohydrates, and fat.  As evidenced by 130 pound wieght loss in 1  year.  Intervention/Goals:  Read diagnostic criteria for anorexia nervosa to [name] and [place].  i've already read that criteria aloud to [place] twice, but I wanted Orene Desanctis to hear it and for her to hear it again in his presence.  I also gave the 2 subtype criteria: restricting type and binge/purge type.  This time [facility] admitted that she meets the criteria.  She did not commit to doing anything about it We discussed treatment options.  I called Quetzal last week to tell her that her treatment team thinks she needs a higher level of care and  suggested [facility] in [place].  Today we talked more about that.  Jeselle really likes the idea, but her initial inquiry reveals a 45 day stay at ~$3000 and she feels she can't justify that.  I said there are potential other options and we can explore those together, but first suggested she talk with her insurance provider and referred them to the insurance resources available through the [employer] website. Challenged cognitive distortions about how many calories she needs and what causes weight gain: concentration camp prisoners were fed  1000 calories/day; her basal metabolic rate is 0940 calories/day; she ate 800 calories/day 6 months ago and still lost weight; etc..  Fronie eats 1/2 that.  Challenged her to challenge those cognitive distortions herself at home   Meal plan:    3 meals    1 snacks To provide 650 kcal    35 g CHO    70 g pro   15-20 g fat  Shake  Quest bar  Meat cooked in fat with green vegetable Yogurt    Monitoring and Evaluation: Patient will follow up in 2 weeks.

## 2014-11-19 NOTE — Telephone Encounter (Signed)
Patient with history of substance abuse.  In rehab at [facility] 3 years ago

## 2014-11-19 NOTE — Patient Instructions (Signed)
additional information for [facility] http://www.nationaleatingdisorders.org/parent-toolkit Make suggestions for meals Proving truth statements like : "concentration camp victims were given 100 calories/day" "Your coma calorie needs are 1200 calories" "you continue to lose muscle mass- you lost 10 pounds of fat free mass in 2 weeks"    Zi think of 3 positive truths for every 1 negative one Consider looking in mirror once an hour, when you look, tell yourself something positive about yourself It take 3500 additional calories to gain a pound of fat, but for a malnourished person, it takes more than that because when a person starts eating again, their metabolism increase crazy amount so they burn more calories to build back the muscle you've lost

## 2014-11-24 ENCOUNTER — Telehealth: Payer: Self-pay | Admitting: *Deleted

## 2014-11-24 ENCOUNTER — Encounter: Payer: Self-pay | Admitting: *Deleted

## 2014-11-24 NOTE — Telephone Encounter (Signed)
Therapist called to follow up on email she sent earlier that afternoon.  I was out of the office and had not responded to the email. This is the email she sent: "Hi Kimberly Stanley,  I am at a bit of a loss regarding how to work with her, myself. Given some additional things she has shared with me, it is even more clear (as if it needed to be any more clear) that she needs a higher level of care. We spent most of the session challenging a lot of cognitive distortions, and she can recognize them when they are pointed out, but Ed is still the loudest voice, and he's getting louder and his demands are getting more dangerous.  She sent me an email yesterday admitting to a new weight goal that made my stomach sink and I almost started crying. I have subsequently called her and I told her about my reaction (stomach and almost crying), I told her I was very scared, and I asked her to talk seriously with her husband this weekend about possible residential treatment (our next session is Tuesday 1/19). I also told her that one of [place] (she or I) would have to tell you her new weight goal, because it is relevant to your work with her.  I am not sure what my next steps will be if she continues to lose weight and also refuses residential or some kind of elevated treatment.   BTW - I was aware of her previous addiction treatment - I had actually assumed that you had been aware of it also, for some reason - sorry about that.  Thanks,  Wilfred Lacy"   The phone conversation summarized the email.  I informed Wilfred Lacy I was equally concerned, as is her doctor and psychiatrist.  I have told patient's husband and patient both she needs a higher level of care and that continuing to work with her will be unethical for me.  I told Wilfred Lacy the same message and she said she is not sure what she is going to do if the patient refuses inpatient treatment.  Wilfred Lacy has told Tkai to talk with her husband about inpatient treatment over the weekend and to  bring him to their next counseling session on 1/19.  I will research appropriate treatment facilities, starting with [facility] and [facility] in the hopes Evea agrees to a high level of care soon.

## 2014-12-03 ENCOUNTER — Ambulatory Visit: Payer: 59 | Admitting: *Deleted

## 2014-12-04 ENCOUNTER — Encounter: Payer: 59 | Admitting: *Deleted

## 2014-12-04 DIAGNOSIS — E441 Mild protein-calorie malnutrition: Secondary | ICD-10-CM

## 2014-12-04 DIAGNOSIS — E669 Obesity, unspecified: Secondary | ICD-10-CM | POA: Diagnosis not present

## 2014-12-04 DIAGNOSIS — F5000 Anorexia nervosa, unspecified: Secondary | ICD-10-CM

## 2014-12-04 NOTE — Progress Notes (Signed)
Appointment start time: 1400  Appointment end time: 1500  Patient was seen on 12/04/14 for nutrition counseling pertaining to disordered eating.  She is accompanied today by her husband, Orene Desanctis  Primary care MD: Dr. Harlan Stains Therapist: Esperanza Heir Any other medical team members: Metta Clines, psychiatry  Assessment:   Weight: 115 Height: 65 Expected body weight: 144 lb (25% body fat) Percent expected body weight: 79.1%%  Breianna continues to lose weight. She has dropped her goal weight again: it is now 98 lb.  She attempted to eat normally this past weekend, but actually overate and subsequently gained 5 pounds on her home scale.  She immediately freaked out and started restricting again.  Her therapist actaully reached out to me for an explanation, which I gave (it is entirely water weight due to decrease GI motility from starvation) and read Anginette the explanation i gave via email. Rabia doesn't believe it. She is still not convinced she needs a higher level of care.  TANITA BODY COMP RESULTS  10/15/13 06/24/14 09/23/14 10/15/14 10/29/14 11/05/14 11/20/14 12/04/14  BMI (kg/m^2) 37.9 23.5 20.9 20.1 19.7 19.9 19 19.1  Fat Mass (lbs) 105.0 40 17.5 16.5 15 10  14.5 9.0  Fat Free Mass (lbs) 122.5 101.5 108 104.5 103.5 109.5 99.5 106.0  Total Body Water (lbs) 89.5 74.5 79 76.5 76 80 73 77.5  Body fat %     12.8% 8.3% 12.7 7.7     Estimated energy needs per [name], bariatric RD: 1000 kcal 45-60 g CHO 60 g pro 55-60 g fat  Nutrition Diagnosis: NI-1.4 Inadequate energy intake As related to dietary intake restricted in calories, carbohydrates, and fat.  As evidenced by 130 pound wieght loss in 1  year.  Intervention/Goals:  Spent the entire visit discussing patient concerns about inpatient treatment.  She has agreed to consider [facility] in [place], [place].  She has taken an application and will fill that out and return to me to submit for her.  Part of the application  is a medical exam, which she had yesterday.  I will send the medical paperwork to her PCP, Dr. Dema Severin.  Meal plan:    3 meals    1 snacks To provide 650 kcal    35 g CHO    70 g pro   15-20 g fat  Shake  Quest bar  Meat cooked in fat with green vegetable Yogurt    Monitoring and Evaluation: Patient will follow up in 2 weeks.

## 2014-12-05 ENCOUNTER — Telehealth: Payer: Self-pay | Admitting: *Deleted

## 2014-12-05 NOTE — Telephone Encounter (Signed)
Received multiple emails about anxiety over admission process to [facility]  Acknowledged patient fears, provided encouragement.    Referred to patient therapist who she is seeing later today

## 2014-12-15 ENCOUNTER — Telehealth: Payer: Self-pay | Admitting: *Deleted

## 2014-12-15 NOTE — Telephone Encounter (Signed)
[name] reached out to Dr. Hassell Done on his cell phone.  He gave permission to share that number with [facility] admission's department so they can reach him more conveniently.

## 2014-12-15 NOTE — Telephone Encounter (Signed)
Kimberly Stanley at admission for [facility] states the last thing for Kimberly Stanley's admission is for communication with her bariatric surgeon, Dr. Hassell Done.  Their medical director, Dr. Daine Floras, has attempted to contact Dr. Earlie Server office twice and left messages.  Dr. Hassell Done has not returned their call.  Kimberly Stanley's admission does depend on this communication.    I will attempt to reach Dr. Hassell Done on his cell phone

## 2014-12-16 ENCOUNTER — Telehealth: Payer: Self-pay | Admitting: *Deleted

## 2014-12-16 NOTE — Telephone Encounter (Signed)
Husband called and lmv which I just returned.  Mazel is very upset and he just wanted to talk that out. Dr. Hassell Done is about to go on vacation for 2 weeks.  Orene Desanctis feels that if Toree doesn't see him before he goes on vacation, she will not go to treatment.  I have offered to call dr Hassell Done on his cell to see if he can see her before he goes on vacation

## 2014-12-16 NOTE — Telephone Encounter (Signed)
Admission to OP is dependent on medical clearance from Dr. Hassell Stanley, bariatric surgeon.  Admission has not proceeded because of this. OP finally got in touch with Dr. Hassell Stanley who does not feel comfortable making any recommendations about Kimberly Stanley's medical assessment until he examens her again.  She has left multiple messages for his office in order to schedule that visit.  She also needs more recent labs and EKG from her PCP, Dr. Dema Stanley.  Those have to be within 2 weeks of admission.  However, the holdup is Dr. Earlie Stanley office, so she can't get the PCP visit Stanley until she sees Dr. Hassell Stanley, for fear of having to repeat everything.  She might not be medically cleared by Dr. Hassell Stanley.  In that case we will have to find another treatment center.

## 2014-12-17 ENCOUNTER — Encounter: Payer: 59 | Attending: Surgery | Admitting: *Deleted

## 2014-12-17 DIAGNOSIS — E441 Mild protein-calorie malnutrition: Secondary | ICD-10-CM

## 2014-12-17 DIAGNOSIS — E669 Obesity, unspecified: Secondary | ICD-10-CM | POA: Insufficient documentation

## 2014-12-17 DIAGNOSIS — Z6820 Body mass index (BMI) 20.0-20.9, adult: Secondary | ICD-10-CM | POA: Insufficient documentation

## 2014-12-17 DIAGNOSIS — Z713 Dietary counseling and surveillance: Secondary | ICD-10-CM | POA: Insufficient documentation

## 2014-12-17 DIAGNOSIS — F5000 Anorexia nervosa, unspecified: Secondary | ICD-10-CM

## 2014-12-17 DIAGNOSIS — Z9884 Bariatric surgery status: Secondary | ICD-10-CM | POA: Diagnosis not present

## 2014-12-17 NOTE — Progress Notes (Signed)
Appointment start time: 0800  Appointment end time: 0830  Patient was seen on 12/17/14 for nutrition counseling pertaining to disordered eating.    Primary care MD: Dr. Harlan Stains Therapist: Esperanza Heir Any other medical team members: Metta Clines, psychiatry  Assessment:  Will see Dr. Hassell Done today at 9 am and Dr. Dema Severin tomorrow at 12 pm to get her labs and EKG done and has TB test later today.  She is doing all the necessary things to get admitted to [facility].  That admission will most likely depend on Dr. Earlie Server exam today.  If she is not cleared by Dr. Hassell Done, she will not get admitted and if she is not admitted to [facility], she says she will not go anywhere else.  If she does not persue inaptietn treatment for her eating disorder, I can not work with her.  She knows this and has accepted it.    Weight: 114 Height: 65 Expected body weight: 144 lb (25% body fat) Percent expected body weight: 79.1%  Reached out to woman on Facebook, who went to OP and that person was really positive so Helaina feels much better about going, if she gets in. She still thinks she doesn't need to go and she's frustrated that the process is taking so long.  She ate more this weekend, but then felt guilty and restricted again the past couple days.  She ate so much this weekend that she was physically uncomfortable, but she kept eating because she knew she would restrict again on Monday.  She is still very much gripped by "all or nothing mentality"  Yesterday she was very stressed trying to deal with all the admissions requirements for OP.   She wanted to emotionally eat, but didn't and she's proud of that.  Dietary assessment:  this weekend: sausage macoroni doughnuts Chili cheese fries Part of a hamburger Pizza Layer cookies  Yesterday: PB 2 Coffee 1 oz chicken and some asparagus 1 bite of layered cookie  Monday Shake Bar Salad from [employer] (mushrooms, spinach, balsamic vinaigrette)  small muffin, couple bites ice cream  Beverages: coffee (32 oz), 2-3 bottles green tea  Exercise: none outside ADLs   TANITA BODY COMP RESULTS  10/15/13 06/24/14 09/23/14 10/15/14 11/05/14 11/20/14 12/04/14 12/17/14  BMI (kg/m^2) 37.9 23.5 20.9 20.1 19.9 19 19.1 19.0  Fat Mass (lbs) 105.0 40 17.5 16.5 10 14.5 9.0 9  Fat Free Mass (lbs) 122.5 101.5 108 104.5 109.5 99.5 106.0 105  Total Body Water (lbs) 89.5 74.5 79 76.5 80 73 77.5 77  Body fat %     8.3% 12.7 7.7 8     Estimated energy needs per [name], bariatric RD: 1000 kcal 45-60 g CHO 60 g pro 55-60 g fat  Nutrition Diagnosis: NI-1.4 Inadequate energy intake As related to dietary intake restricted in calories, carbohydrates, and fat.  As evidenced by 130 pound wieght loss in 1  year.  Intervention/Goals:  Discussed future care needs.  Discussed her continued "all or nothing thinking" and how that signifies her need for a higher level of care.  She is not able to follow her meal plan at home consistently.  Discussed intuitive eating: honoring internal hunger and fullness cues, like the way children do.  She states she's never been able to do that, but she would like to.    Meal plan:    3 meals    1 snacks To provide 650 kcal    35 g CHO    70  g pro   15-20 g fat  Shake  Quest bar  Meat cooked in fat with green vegetable Yogurt    Monitoring and Evaluation: Patient will follow up in 2 weeks.

## 2014-12-23 ENCOUNTER — Other Ambulatory Visit: Payer: Self-pay | Admitting: Internal Medicine

## 2014-12-23 ENCOUNTER — Encounter: Payer: Self-pay | Admitting: Internal Medicine

## 2014-12-23 DIAGNOSIS — E039 Hypothyroidism, unspecified: Secondary | ICD-10-CM

## 2014-12-23 MED ORDER — LEVOTHYROXINE SODIUM 112 MCG PO CAPS: ORAL_CAPSULE | ORAL | Status: AC

## 2014-12-23 NOTE — Progress Notes (Signed)
Received labs for pt drawn on 12/17/2014: - TSH 0.14 (0.34-4.5) - fT4 1.18 (0.61-1.12) - TT3 96 (71-180) Will advise her to decrease Tirosint from 125 to 112 mcg daily. Need to repeat TFTs in 2 months.

## 2014-12-25 ENCOUNTER — Other Ambulatory Visit: Payer: Self-pay | Admitting: Internal Medicine

## 2014-12-31 ENCOUNTER — Ambulatory Visit: Payer: 59 | Admitting: *Deleted

## 2015-01-06 ENCOUNTER — Telehealth: Payer: Self-pay | Admitting: *Deleted

## 2015-01-06 NOTE — Telephone Encounter (Signed)
Called pt and lvm advising her per Dr Arman Filter lab result note: - TSH 0.14 (0.34-4.5)  - fT4 1.18 (0.61-1.12)  - TT3 96 (71-180)  The TSH is too low and the free T4 is too high >> let's decrease Tirosint from 125 to 112 mcg daily. I sent this to your pharmacy.  We need to repeat TFTs in 2 months. Please call our main office number [phone]) to schedule a lab appointment. Advised pt to call with any questions.

## 2015-01-14 ENCOUNTER — Ambulatory Visit: Payer: 59 | Admitting: *Deleted

## 2015-01-21 ENCOUNTER — Ambulatory Visit: Payer: 59 | Admitting: Internal Medicine

## 2015-01-28 ENCOUNTER — Ambulatory Visit: Payer: 59 | Admitting: *Deleted

## 2015-02-02 ENCOUNTER — Telehealth: Payer: Self-pay | Admitting: *Deleted

## 2015-02-02 NOTE — Telephone Encounter (Signed)
Spoke with Kimberly Stanley, RD at [facility] was discharged from [facility] today.   She was consuming 2100 kcal/day, but her LFTs were elevated.  Only when her meal plan was decreased to 1900 kcal did her LFTs stabilize.  She was discharged on 1900 kcal. Initial goal weight was 115-125 lb, but inpatient RD feels this is not adequate.  Her cognitive distortion is so strong at this weight, inpatient RD feels [name]'s goal wieght needs to be higher to combat the cognitive distortion.  Inpatient RD feels 135 lb is more optimal.  [name] is terrified of weighing 135 lb and has told inpatient team she will start to restrict as soon as she is able if she doesn't like her weight.  She was discharged weighing 120 lb.    Inpatient RD not optimistic about [name]'s success in outpatient setting.

## 2015-02-11 ENCOUNTER — Ambulatory Visit: Payer: 59 | Admitting: *Deleted

## 2015-02-12 ENCOUNTER — Encounter: Payer: 59 | Attending: Surgery | Admitting: *Deleted

## 2015-02-12 VITALS — Wt 122.0 lb

## 2015-02-12 DIAGNOSIS — F509 Eating disorder, unspecified: Secondary | ICD-10-CM

## 2015-02-12 DIAGNOSIS — Z713 Dietary counseling and surveillance: Secondary | ICD-10-CM | POA: Diagnosis not present

## 2015-02-12 DIAGNOSIS — E669 Obesity, unspecified: Secondary | ICD-10-CM | POA: Insufficient documentation

## 2015-02-12 DIAGNOSIS — Z9884 Bariatric surgery status: Secondary | ICD-10-CM | POA: Insufficient documentation

## 2015-02-12 DIAGNOSIS — Z6820 Body mass index (BMI) 20.0-20.9, adult: Secondary | ICD-10-CM | POA: Diagnosis not present

## 2015-02-12 NOTE — Progress Notes (Signed)
Appointment start time: 1130  Appointment end time: 1230  Patient was seen on 02/12/15 for nutrition counseling pertaining to disordered eating  Primary care provider: Harlan Stains Therapist: Esperanza Heir Any other medical team members: none.  Needs new psychaitrist Spouse: Lane  Assessment:  Kimberly Stanley was released from [facility] on 3/28 after 6 weeks in residential and PHP treatment.  She was discharged prematurely against professional advise due to poor insurance coverage.  Her insurance will no longer cover residential care or PHP (partial hospitalization program), but will cover IOP (intensive outpatient program).  Her team at [facility], as well as her team here in [place] recommended she stay at [facility] for their IOP program, but Shawntina instead came home.  She has not enrolled in the IOP, [place], that was recommended to her several weeks prior to discharge.  While in treatment she restored 3 pounds from 117 to 120.  She has restored 8 pounds to date, according to the scale here in [facility].  She has increase her body fat % by 10%, according to the [place] body composition scale.  I feel that might not be totally accurate, but she has made tremendous improvements in her eating.  While in treatment, she was on a 2100 kcal/day meal plan.  That was slightly decreased to 1900 kcal due to continued elevation in her liver enzymes.  She has been instructed to follow up with her PCP about these abnormal liver enzymes.  Per inpatient RD, Kohana's goal weight is 120-130 pounds.  However, in speaking with her inpatient team, it is possible Aaliyan's goal weight might be higher in order to adequate nourish her brain to fight back against her very strong eating disorder thoughts  She did not like being in treatment, but has realized some of the benefits since she's been home for a week.  Mainly, she realizes she wasn't eating enough Anxiety levels really high around meals and wasn't able to get in  touch with hunger fullness cues Weighed when she got home.  Realized she was the same weight as prior to treatment and is trying to eat like she did at OP.  She's been eating 3 meals and 3 snacks ever since being home. She is also eating a wide variety of foods.  She is ok with her current weight, but does not want to weigh more than 125 lb.  She wants to maintain where she is and as long as she doesn't gain weight, she is ok eating.  She isn't counting calories any more Read Intuitive Eating and liked it. Is trying to be more mindful with her eating and make meals more enjoyable.  Is trying to honor hunger and fullness cues.   Eats breakfast since being home.  Had trouble finishing meals in treatment due to High anxiety there and end up throwing up several times.  Now that she's home, she is able to finish her meals better.  She takes longer to finish, but she eats it When questioned about the IOP, she states she is open to [facility]- but wants daytime options and they only have night time sessions: 6-9 pm 3 days/week.  All meals were portioned for [facility] in treatment.  She will need continued guidance on meal planning, intuitive eating (flexibility), challenging fear foods, moderate exercise, abstaining from compensatory behaviors, and challenging ED thoughts   Medications:  See list.  All previous meds. Patient was prescribed Clonazepam 0.5 mg and Vistaril 50 mg in treatment, but decided not  to take those medications at home.  She state she doesn't need them She also has not been taking her vitamins/supplements since being home.   She states she forgot.   TANITA  BODY COMP RESULTS  12/17/14 02/12/15   BMI (kg/m^2) 19 20.3   Fat Mass (lbs) 9 22.5   Fat Free Mass (lbs) 105 99.5   Total Body Water (lbs) 77 73  Body fat % 8 18.3    Growth Metrics: Ideal BMI for age: 55-25 BMI today: 20.3 % Ideal today:  100% Goal BMI range based on previous weight: 22.5-25 % goal BMI: 78-88 Goal  weight range based on growth chart data: 135-150 Goal rate of weight gain:  0.5 lb/week  Mental health diagnosis: AN, restricting type  Dietary assessment: A typical day consists of 63meals and 3 snacks  Safe foods include: lean proteins, vegetables Avoided foods include: none right now  24 hour recall: B: small bagel with peanut butter and banana; yogurt and granola and strawberries S: trail mix L: goes out with friends- had chicken salad sandwich; chick-fil-a nuggets with fries; pb and j with apple (is trying to have lunch with people) S: coffee with danish from starbucks; yogurt and granola; 1/2 cookie.  Eats what sounds good D: pizza; junior burger and chili cheese fries from [employer]; fried chicken from [employer] (nothing grilled since being home); chicken salsa and black beans- ate until satisfied; rice and beef tips with mushroom; pork chips, pasta, and vegetable S: cookie; ice cream   Compensatory behaviors:  None currently Physical activity: none.  Plans to start walking in the evenings            Estimated energy intake: 1900 kcal  Estimated energy needs for weight maitenance: 1800 kcal 225 g CHO 90 g pro 60 g fat  Nutrition Diagnosis: NB-1.2 Harmful beliefs/attitudes about food or nutrition-related topics  As related to proper balance of fats, carbohydrates, and proteins.  As evidenced by eating disorder.  Intervention/Goals: Please apply to [facility].  Patient still needs a high level of psychological care and an IOP is the highest level her insurance will cover.  Please also follow up with PCP regularly until liver enzymes stabilize.  Please also get ultrasound of liver, per Oliver-Pyatt recommendations.  Continue current eating pattern and please reinitiate your vitamins   Monitoring and Evaluation: Patient will follow up in 1 weeks.  Needs new psychiatrist

## 2015-02-25 ENCOUNTER — Encounter: Payer: 59 | Admitting: *Deleted

## 2015-02-25 VITALS — Wt 121.5 lb

## 2015-02-25 DIAGNOSIS — E669 Obesity, unspecified: Secondary | ICD-10-CM | POA: Diagnosis not present

## 2015-02-25 DIAGNOSIS — F509 Eating disorder, unspecified: Secondary | ICD-10-CM

## 2015-02-25 NOTE — Progress Notes (Signed)
Appointment start time: 0800 Appointment end time: 0900  Patient was seen on 02/25/15 for nutrition counseling pertaining to disordered eating  Primary care provider: Harlan Stains Therapist: Esperanza Heir Any other medical team members: none.  Needs new psychaitrist Spouse: Lane  Assessment:  Kimberly Stanley is maintaining her weight!  She continues to be very fearful of gaining weight and is constantly fighting against her eating disorder.  She is challenging those eating disorder thoughts and doing the opposite of whatever her eating disorder tells her.  She realizes that her time at OP was more helpful than she initially wanted to admit.   spoke to dr white who requested medical records to OP for liver records before ordering ultrasound.  Has appointment in with Dr. Dema Severin in May.  She is going to the beach next week and is anxious about that.  She is really fighting the urge to restrict.  So fat she hasn't.  She is fearful of her metabolism slowing down so that motivates her to eat.  She was not discharged on a meal plan and is not couting calroies, carbs, proteins, fats, etc.  She would not like direction on how much she needs to eat because that would be triggering. Right now she's enjoying whatever she wants and not counting carbs or calories. Not sure about her hunger cues.  She eats on a schedule, not necessarily when she's feeling hungry.  She is trying to stop when she's full.  Sometimes she takes awhile to finish a meal because she gets full quickly.  Then she comes back to her food a little later and finishes it without issue.  Many times, though she eats fast and sometimes gets uncomfortably full.  She does weigh herself daily.  She is ok with the modest weight fluctuations and is not as obsessed with subtle changes in her weight.  She weighs herself to counter-act the feelings of excessive weight gain. She struggles with her body image and would really like to weigh 115 lb.  But she is fighting  against that and eating what is appropriate.  She would like to not have to feel like she has to weigh, but right now it's helping motivate her to continue to eat.  Since she is maintaining her weight, weighing reinforces that    HPI: Kimberly Stanley was released from [facility] on 3/28 after 6 weeks in residential and PHP treatment.  She was discharged prematurely against professional advise due to poor insurance coverage.  Her insurance will no longer cover residential care or PHP (partial hospitalization program), but will cover IOP (intensive outpatient program).  Her team at [facility], as well as her team here in [place] recommended she stay at [facility] for their IOP program, but Tacha instead came home.  She has not enrolled in the IOP, [place], that was recommended to her several weeks prior to discharge.  While in treatment she restored 3 pounds from 117 to 120.  She has restored 8 pounds to date, according to the scale here in [facility].  She has increase her body fat % by 10%, according to the [place]ita body composition scale.  I feel that might not be totally accurate, but she has made tremendous improvements in her eating.  While in treatment, she was on a 2100 kcal/day meal plan.  That was slightly decreased to 1900 kcal due to continued elevation in her liver enzymes.  She has been instructed to follow up with her PCP about these abnormal liver enzymes.  Per inpatient RD, Ashlyn's goal weight is 120-130 pounds.  However, in speaking with her inpatient team, it is possible Kimberly Stanley's goal weight might be higher in order to adequate nourish her brain to fight back against her very strong eating disorder thoughts  She did not like being in treatment, but has realized some of the benefits since she's been home for a week.  Mainly, she realizes she wasn't eating enough Anxiety levels really high around meals and wasn't able to get in touch with hunger fullness cues Weighed when she got home.   Realized she was the same weight as prior to treatment and is trying to eat like she did at OP.  She's been eating 3 meals and 3 snacks ever since being home. She is also eating a wide variety of foods.  She is ok with her current weight, but does not want to weigh more than 125 lb.  She wants to maintain where she is and as long as she doesn't gain weight, she is ok eating.  She isn't counting calories any more Read Intuitive Eating and liked it. Is trying to be more mindful with her eating and make meals more enjoyable.  Is trying to honor hunger and fullness cues.   Eats breakfast since being home.  Had trouble finishing meals in treatment due to High anxiety there and end up throwing up several times.  Now that she's home, she is able to finish her meals better.  She takes longer to finish, but she eats it When questioned about the IOP, she states she is open to [facility]- but wants daytime options and they only have night time sessions: 6-9 pm 3 days/week.  All meals were portioned for [facility] in treatment.  She will need continued guidance on meal planning, intuitive eating (flexibility), challenging fear foods, moderate exercise, abstaining from compensatory behaviors, and challenging ED thoughts   Medications:  See list.  All previous meds. Patient was prescribed Clonazepam 0.5 mg and Vistaril 50 mg in treatment, but decided not to take those medications at home.  She state she doesn't need them She also has not been taking her vitamins/supplements since being home.   She states she forgot.   TANITA  BODY COMP RESULTS  12/17/14 02/12/15 02/25/15   BMI (kg/m^2) 19 20.3 20.2   Fat Mass (lbs) 9 22.5 21.5   Fat Free Mass (lbs) 105 99.5 100   Total Body Water (lbs) 77 73 73  Body fat % 8 18.3 17.8    Growth Metrics: Ideal BMI: 20-25 BMI today: 20.2 % Ideal today:  100% Goal BMI range based on previous weight: 22.5-25 % goal BMI: 78-88 Goal weight range based on growth chart data:  135-150 Goal rate of weight gain:  0.5 lb/week  Mental health diagnosis: AN, restricting type  Dietary assessment: A typical day consists of 38meals and 3 snacks  Safe foods include: lean proteins, vegetables Avoided foods include: none right now  24 hour recall: B: raisin bran S: trail mix L: cracker barrel with dad S: few crackers D: [place] sandwich with chips S: trail mix  Compensatory behaviors:  None currently Physical activity: none.  Plans to start walking in the evenings            Estimated energy intake: 1900 kcal  Estimated energy needs for weight maitenance: 1800 kcal 225 g CHO 90 g pro 60 g fat  Nutrition Diagnosis: NB-1.2 Harmful beliefs/attitudes about food or nutrition-related topics  As related  to proper balance of fats, carbohydrates, and proteins.  As evidenced by eating disorder.  Intervention/Goals:  Goals:  weigh 2/week Aim to eat more slowly.  Try to make meals last 20 minutes and eat more mindfully  Previous goals: Please apply to [facility].  Patient still needs a high level of psychological care and an IOP is the highest level her insurance will cover.  Please also follow up with PCP regularly until liver enzymes stabilize.  Please also get ultrasound of liver, per Oliver-Pyatt recommendations.  Continue current eating pattern and please reinitiate your vitamins    Monitoring and Evaluation: Patient will follow up in 2 weeks.  Needs new psychiatrist

## 2015-03-11 ENCOUNTER — Encounter: Payer: 59 | Attending: Surgery | Admitting: *Deleted

## 2015-03-11 DIAGNOSIS — Z713 Dietary counseling and surveillance: Secondary | ICD-10-CM | POA: Diagnosis not present

## 2015-03-11 DIAGNOSIS — Z6820 Body mass index (BMI) 20.0-20.9, adult: Secondary | ICD-10-CM | POA: Insufficient documentation

## 2015-03-11 DIAGNOSIS — Z9884 Bariatric surgery status: Secondary | ICD-10-CM | POA: Diagnosis not present

## 2015-03-11 DIAGNOSIS — F5000 Anorexia nervosa, unspecified: Secondary | ICD-10-CM

## 2015-03-11 DIAGNOSIS — E669 Obesity, unspecified: Secondary | ICD-10-CM | POA: Diagnosis present

## 2015-03-11 NOTE — Progress Notes (Signed)
Appointment start time: 0800 Appointment end time: 0900  Patient was seen on 03/11/15 for nutrition counseling pertaining to disordered eating  Primary care provider: Harlan Stains Therapist: Esperanza Heir Any other medical team members: none.  Needs new psychaitrist Spouse: Lane  Assessment:  She just got from the beach and had a great time.  She "grazed and grazed" while there.  Says she "wasn't good."  Her meals were not big meals, but did a lot of grazing.  She is scared she gained too much weight, but she's trying to still watch what she eats, but not trying to starve herself.  She is trying to compensate for her time at the beach and is restricting some.  Still eating her meals and snacks, but smaller portions and less cookies.  She is weighing daily.  She feels she over ate while at the beach.  Further probing reveals she felt physically uncomfortable after eating only a couple times after a large dinner.  That means she didn't over eat.  She also gained some water-weight because she was eating more salt than she normally does and she retained fluids.   She thinks if she eats whatever she wants when she's hungry, she's giving up and letting herself go and that she will just keep gaining and gaining.  She's been to PCP office yesterday for labs to test liver function.    Medications:  See list.  All previous meds. Patient was prescribed Clonazepam 0.5 mg and Vistaril 50 mg in treatment, but decided not to take those medications at home.  She state she doesn't need them She also has not been taking her vitamins/supplements since being home.   She states she forgot.   TANITA  BODY COMP RESULTS  12/17/14 02/12/15 02/25/15 03/11/15   BMI (kg/m^2) 19 20.3 20.2 20.9   Fat Mass (lbs) 9 22.5 21.5 23.5   Fat Free Mass (lbs) 105 99.5 100 102   Total Body Water (lbs) 77 73 73 74.5  Body fat % 8 18.3 17.8 18.8     Mental health diagnosis: AN, restricting type  Dietary assessment: A typical day  consists of 4meals and 3 snacks  Safe foods include: lean proteins, vegetables Avoided foods include: none right now  24 hour recall: B: bagel, banana, PB2 S: trail mix L: frosted coffee and super foods salad from chick-fil-a S: coffee and cinnamon bun D: 1/2 large rueben sandwich, fries, chips and dip S: trail mix   Compensatory behaviors:  None currently Physical activity: none.  Plans to start walking in the evenings            Estimated energy intake: 1900 kcal  Estimated energy needs for weight maitenance: 1800 kcal 225 g CHO 90 g pro 60 g fat  Nutrition Diagnosis: NB-1.2 Harmful beliefs/attitudes about food or nutrition-related topics  As related to proper balance of fats, carbohydrates, and proteins.  As evidenced by eating disorder.  Intervention/Goals:   Weight every other day.  Be mindful of hunger AND fullness cues.  Eat more slowly and make meal an experience.  Use cue card from OP and stop eating when comfortable/satisfied.  Your body will let you know when it's had enough  Please remember to take your vitamins  Monitoring and Evaluation: Patient will follow up in 1 weeks.  Needs new psychiatrist

## 2015-03-18 ENCOUNTER — Encounter: Payer: 59 | Admitting: *Deleted

## 2015-03-18 VITALS — Ht 65.0 in | Wt 127.0 lb

## 2015-03-18 DIAGNOSIS — F5000 Anorexia nervosa, unspecified: Secondary | ICD-10-CM

## 2015-03-18 DIAGNOSIS — E669 Obesity, unspecified: Secondary | ICD-10-CM | POA: Diagnosis not present

## 2015-03-18 NOTE — Progress Notes (Signed)
Appointment start time: 0800 Appointment end time: 0900  Patient was seen on 511/16 for nutrition counseling pertaining to disordered eating  Primary care provider: Harlan Stains Therapist: Esperanza Heir Any other medical team members: none.  Needs new psychaitrist Spouse: Lane  Assessment:  Kimberly Stanley feels like she has "overindulged" all week.  She feels very guilty about her eating habits.  She is sure she has gained excessively and is very distressed about being outside her comfort range of 120-125 lb.  She has started restricting some what.  She deliberately leaves food on her plate and this morning chose a very low calorie option.  She knows her thoughts are disordered and not appropriate so she also brought some trail mix along to supplement her very small breakfast    Medications:  See list.  All previous meds. Patient was prescribed Clonazepam 0.5 mg and Vistaril 50 mg in treatment, but decided not to take those medications at home.  She state she doesn't need them    TANITA  BODY COMP RESULTS  12/17/14 02/12/15 02/25/15 03/11/15 03/18/15   BMI (kg/m^2) 19 20.3 20.2 20.9 21.1   Fat Mass (lbs) 9 22.5 21.5 23.5 25   Fat Free Mass (lbs) 105 99.5 100 102 102   Total Body Water (lbs) 77 73 73 74.5 74.5  Body fat % 8 18.3 17.8 18.8 19.6     Mental health diagnosis: AN, restricting type  Dietary assessment: A typical day consists of 66meals and 3 snacks  Safe foods include: lean proteins, vegetables Avoided foods include: none right now  24 hour recall:  B: yogurt and granola S: trail mix L: 1/2 bagel with PB2 S: 2/3 of [employer] frappe D: quesadilla with beans and cheese S: trail mix   Compensatory behaviors:  None currently Physical activity: none.  Plans to start walking in the evenings            Estimated energy intake:  1600 kcal  Estimated energy needs for weight maitenance: 1800 kcal 225 g CHO 90 g pro 60 g fat  Nutrition Diagnosis: NB-1.2 Harmful  beliefs/attitudes about food or nutrition-related topics  As related to proper balance of fats, carbohydrates, and proteins.  As evidenced by eating disorder.  Intervention/Goals:   Listened and affirmed Kimberly Stanley's concerns.  Assured her that her weight is still healthy.  Corrected cognitive distortions about weight and health, calories, and actual food intake.    Goals: Write declaration of independence from eating disorder (life without Ed) Write letter to your eating disorder   Monitoring and Evaluation: Patient will follow up in 1 weeks.  Needs new psychiatrist

## 2015-03-19 ENCOUNTER — Encounter: Payer: Self-pay | Admitting: *Deleted

## 2015-03-25 ENCOUNTER — Ambulatory Visit: Payer: 59 | Admitting: *Deleted

## 2015-04-01 ENCOUNTER — Other Ambulatory Visit (INDEPENDENT_AMBULATORY_CARE_PROVIDER_SITE_OTHER): Payer: Self-pay

## 2015-04-01 ENCOUNTER — Ambulatory Visit: Payer: 59 | Admitting: *Deleted

## 2015-04-01 DIAGNOSIS — R1084 Generalized abdominal pain: Secondary | ICD-10-CM

## 2015-04-01 DIAGNOSIS — K458 Other specified abdominal hernia without obstruction or gangrene: Secondary | ICD-10-CM

## 2015-04-01 DIAGNOSIS — Z9884 Bariatric surgery status: Secondary | ICD-10-CM

## 2015-05-26 ENCOUNTER — Encounter: Payer: 59 | Attending: Surgery | Admitting: *Deleted

## 2015-05-26 VITALS — Ht 65.0 in | Wt 133.5 lb

## 2015-05-26 DIAGNOSIS — E669 Obesity, unspecified: Secondary | ICD-10-CM | POA: Insufficient documentation

## 2015-05-26 DIAGNOSIS — Z713 Dietary counseling and surveillance: Secondary | ICD-10-CM | POA: Insufficient documentation

## 2015-05-26 DIAGNOSIS — Z6820 Body mass index (BMI) 20.0-20.9, adult: Secondary | ICD-10-CM | POA: Diagnosis not present

## 2015-05-26 DIAGNOSIS — Z9884 Bariatric surgery status: Secondary | ICD-10-CM | POA: Diagnosis not present

## 2015-05-26 DIAGNOSIS — F5000 Anorexia nervosa, unspecified: Secondary | ICD-10-CM

## 2015-05-26 NOTE — Progress Notes (Signed)
Appointment start time: 1700 Appointment end time: 1800  Patient was seen on 05/26/15 for nutrition counseling pertaining to disordered eating  Primary care provider: Harlan Stains Therapist: Esperanza Heir weekly Any other medical team members: Dr. Toy Care in November Spouse: Orene Desanctis  Assessment:  Damica has not been seen by this provider in over 2 months due to starting new job and not wanting to take time off.  She has continued to work with her therapist regularly.  Ivery thinks she is eating too much.  Her clothes are tighter.  She feels out of control with her eating.  She has food in her desk at work.  She feels very uncomfortable with her body.  She feels like she is snacking uncontrollably in the evenings.  She wishes she had more self control and wants a list of foods she can have and can't have and she wants a list of portions she should be eating. Is waiting for additional liver enzyme testing.  May need ultrasound if things have not normalized   Medications:  See list.  No changes   TANITA  BODY COMP RESULTS  12/17/14 02/12/15 02/25/15 03/11/15 03/18/15 05/25/15   BMI (kg/m^2) 19 20.3 20.2 20.9 21.1 22.2   Fat Mass (lbs) 9 22.5 21.5 23.5 25 26    Fat Free Mass (lbs) 105 99.5 100 102 102 107.5   Total Body Water (lbs) 77 73 73 74.5 74.5 78.5  Body fat % 8 18.3 17.8 18.8 19.6 19.6     Mental health diagnosis: AN, restricting type  Dietary assessment: A typical day consists of 67meals and 3 snacks  Safe foods include: lean proteins, vegetables Avoided foods include: none right now  24 hour recall B: low calorie shake.  On weekends has tortilla with eggs and cheese; biscuits with sausage gravy L: at home has sandwich or goes out; at work lunch is provided daily: [place], sub S: peanut butter crackers sometimes or trail mix.  At home may stop by and get cinnamon roll or nibble on cookies D: going out more often- gets upset stomach and throws up; does eat less than her husband Feels her  grazing and her snacking is out of control.  Feels like she cant stop: cookies, popcorn, trail mix, peanut butter crackers.  Even if she's not hungry  Physical activity: not much   Compensatory behaviors:  None currently             Estimated energy intake:  1600 kcal  Estimated energy needs for weight maitenance: 1800 kcal 225 g CHO 90 g pro 60 g fat  Nutrition Diagnosis: NB-1.2 Harmful beliefs/attitudes about food or nutrition-related topics  As related to proper balance of fats, carbohydrates, and proteins.  As evidenced by eating disorder.  Intervention/Goals:   Assured Patrick she has not gained excessive weight.  She was shocked.  Explained that her eating disorder/distorted body image has really gotten a strong hold on her.  Emphasized need for regular nutrition appointments. Challenges cognitive distortions and diet myths.  Encouraged mindful eating.  Eat without distractions,eat more slowly, honor internal hunger and fullness cues.  Eat more during the day as to not feel so hungry at night.  Give herself permission to eat any and all foods in the amount she needs.  Let go of the guilt or foods will always have a hold over her.  She deserves to eat; food is not an indulgence; it's a right.  Will not give her a list of foods because no  foods are forbidden. Did give some snack ideas  Monitoring and Evaluation: Patient will follow up in 2 weeks.

## 2015-05-27 ENCOUNTER — Encounter: Payer: Self-pay | Admitting: *Deleted

## 2015-06-09 ENCOUNTER — Ambulatory Visit: Payer: 59 | Admitting: *Deleted

## 2015-06-11 ENCOUNTER — Ambulatory Visit: Payer: 59 | Admitting: *Deleted

## 2015-07-02 ENCOUNTER — Ambulatory Visit: Payer: 59 | Admitting: *Deleted

## 2015-07-28 ENCOUNTER — Encounter: Payer: 59 | Attending: Surgery | Admitting: *Deleted

## 2015-07-28 VITALS — Ht 65.0 in | Wt 137.5 lb

## 2015-07-28 DIAGNOSIS — Z6820 Body mass index (BMI) 20.0-20.9, adult: Secondary | ICD-10-CM | POA: Diagnosis not present

## 2015-07-28 DIAGNOSIS — F509 Eating disorder, unspecified: Secondary | ICD-10-CM

## 2015-07-28 DIAGNOSIS — E669 Obesity, unspecified: Secondary | ICD-10-CM | POA: Insufficient documentation

## 2015-07-28 DIAGNOSIS — Z713 Dietary counseling and surveillance: Secondary | ICD-10-CM | POA: Insufficient documentation

## 2015-07-28 DIAGNOSIS — Z9884 Bariatric surgery status: Secondary | ICD-10-CM | POA: Insufficient documentation

## 2015-07-28 NOTE — Progress Notes (Signed)
Appointment start time: 1700 Appointment end time: 1800  Patient was seen on 07/28/15 for nutrition counseling pertaining to disordered eating  Primary care provider: Harlan Stains Therapist: Esperanza Heir weekly Any other medical team members: Dr. Toy Care in November Spouse: Orene Desanctis  Assessment:  Kimberly Stanley has not been seen by this provider in over 2 months due to starting new job and not wanting to take time off.  She has gained weight and is very unhappy.  Yesterday she started restricting again.   She has been eating mindlessly.  She is engaged in all or nothing thinking.   Medications:  See list.  No changes   TANITA  BODY COMP RESULTS  12/17/14 02/12/15 02/25/15 03/11/15 03/18/15 05/25/15 07/28/15   BMI (kg/m^2) 19 20.3 20.2 20.9 21.1 22.2 22.9   Fat Mass (lbs) 9 22.5 21.5 23.5 25 26  35   Fat Free Mass (lbs) 105 99.5 100 102 102 107.5 102.5   Total Body Water (lbs) 77 73 73 74.5 74.5 78.5 75  Body fat % 8 18.3 17.8 18.8 19.6 19.6 25.6     Mental health diagnosis: AN, restricting type  Dietary assessment: A typical day consists of 61meals and 3 snacks  Safe foods include: lean proteins, vegetables Avoided foods include: none right now   Normal day B: pop tart S: peanut butter crackers L: whatever is brought into the office: [place], greek, [place], sandwich- chips, cookie S: peanut butter crackers D: pizza; takeout (usually gets appetitzer when eating out) S: raw almonds, chips, graze at night, ritz crackers and peanut butter Beverages: coffee, powerade zero, not much  Physical activity: not much  24 hour recall B: premier shake S: 1 peanut butter cracker L: [employer] bar S: 1 cracker D: salad with grilled chicken (2 oz) S: yogurt with granola  Compensatory behaviors:  None currently             Estimated energy intake:  1800 kcal, restricted to 1000 kcal yesterday  Estimated energy needs for weight maitenance: 1800 kcal 225 g CHO 90 g pro 60 g fat  Nutrition Diagnosis:  NB-1.2 Harmful beliefs/attitudes about food or nutrition-related topics  As related to proper balance of fats, carbohydrates, and proteins.  As evidenced by eating disorder.  Intervention/Goals:   Encouraged patient to reject traditional diet mentality of "good" vs "bad" foods.  There are no good and bad foods, but rather food is fuel that we needs for our bodies.  When we don't get enough fuel, our bodies suffer the metabolic consequences.  Encouraged patient to eat whatever foods will satisfy them, regardless of their nutritional value.  We will discuss nutritional values of foods at a subsequent appointment.  Encouraged patient to honor their body's internal hunger and fullness cues.  Throughout the day, check in mentally and rate hunger.  Try not to eat when ravenous, but instead when slightly hungry.  Then choose food(s) that will be satisfying regardless of nutritional content.  Sit down to enjoy those foods.  Minimize distractions: turn off tv, put away books, work, Brewing technologist.  Make the meal last at least 20 minutes in order to give time to experience and register satiety.  Stop eating when full regardless of how much food is left on the plate.  Get more if still hungry.  The key is to honor fullness so throughout the meal, rate fullness factor and stop when comfortably full, but not stuffed.  Reminded patient that they can have any food they want, whenever they want, and  however much they want.  Eventually the novelty will wear out and each food will be equal in terms of its emotional appeal.  This will be a learning process and some days more food will be eaten, some days less.  The key is to honor hunger and fullness without any feelings of guilt.  Pay attention to what the internal cues are, rather than any external factors.  Need to see therapist regularly.   Reread Intuitive Eating  Monitoring and Evaluation: Patient will follow up in 5 weeks.

## 2015-09-08 ENCOUNTER — Encounter: Payer: Self-pay | Admitting: *Deleted

## 2015-09-08 ENCOUNTER — Encounter: Payer: 59 | Attending: Surgery | Admitting: *Deleted

## 2015-09-08 VITALS — Wt 143.0 lb

## 2015-09-08 DIAGNOSIS — E669 Obesity, unspecified: Secondary | ICD-10-CM | POA: Insufficient documentation

## 2015-09-08 DIAGNOSIS — Z713 Dietary counseling and surveillance: Secondary | ICD-10-CM | POA: Diagnosis not present

## 2015-09-08 DIAGNOSIS — Z6820 Body mass index (BMI) 20.0-20.9, adult: Secondary | ICD-10-CM | POA: Diagnosis not present

## 2015-09-08 DIAGNOSIS — Z9884 Bariatric surgery status: Secondary | ICD-10-CM | POA: Insufficient documentation

## 2015-09-08 DIAGNOSIS — F509 Eating disorder, unspecified: Secondary | ICD-10-CM

## 2015-09-08 NOTE — Progress Notes (Signed)
Appointment start time: 1700 Appointment end time: 1800  Patient was seen on 09/08/15 for nutrition counseling pertaining to disordered eating  Primary care provider: Harlan Stains Therapist: Esperanza Heir weekly Any other medical team members: Dr. Toy Care in November Spouse: Kimberly Stanley  Assessment:  Kimberly Stanley is not consistent with her recovery team.  It's been 6 weeks since last nutrition visit and she can't remember the last time she saw her therapist. She is very unhappy with her body.  She feels her eating is out of control.  She feels she keeps gaining weight and that she might never stop.  She feels fat and feels she's "right back when she started." she is consumed with thoughts of foods and wants to restrict.  She became emotional today in session talking about her weight.  She is overcome with guilt about her eating  Medications:  See list.  No changes   TANITA  BODY COMP RESULTS  12/17/14 02/12/15 02/25/15 03/11/15 03/18/15 05/25/15 07/28/15 09/08/15   BMI (kg/m^2) 19 20.3 20.2 20.9 21.1 22.2 22.9 23.9   Fat Mass (lbs) 9 22.5 21.5 23.5 25 26  35 32   Fat Free Mass (lbs) 105 99.5 100 102 102 107.5 102.5 111   Total Body Water (lbs) 77 73 73 74.5 74.5 78.5 75 81.5  Body fat % 8 18.3 17.8 18.8 19.6 19.6 25.6 22.2     Mental health diagnosis: AN, restricting type  Dietary assessment: A typical day consists of 62meals and 3 snacks  Safe foods include: lean proteins, vegetables Avoided foods include: none right now   Normal day B:shake S: Nabs L: caterered lunch S: not always   Physical activity: not much    Compensatory behaviors:  None currently             Estimated energy intake:  1800 kcal  Estimated energy needs for weight maitenance: 1800 kcal 225 g CHO 90 g pro 60 g fat  Nutrition Diagnosis: NB-1.2 Harmful beliefs/attitudes about food or nutrition-related topics  As related to proper balance of fats, carbohydrates, and proteins.  As evidenced by eating  disorder.  Intervention/Goals:   Encouraged patient to reject traditional diet mentality of "good" vs "bad" foods.  She will continue to binge if she continues to restrict.  Discussed weight increase is from water only. Her fat has decreased.  The body dissatisfaction she's experiencing is a cognitive distortion.  Her eating disorder voice is LOUD right now.  Discussed cutting back on sodium: eat breakfast from whole foods (nto a shake and then slaty crackers), catered lunch at work, and dinner cooked at home.  Reminded mindful eating: at the table without distractions.  Do not eat in the bed.  Honor internal cues, but also find coping strategies besides food when feeling upset (need therapy). Please challenge those eating disorder thoughts.  Need positive messaging constantly  Need to see therapist regularly! This provider will not schedule another visit until [facility] schedules with therapist.   Reread Intuitive Eating  Monitoring and Evaluation: Patient will follow up prn.

## 2015-09-23 ENCOUNTER — Emergency Department (HOSPITAL_COMMUNITY): Payer: 59 | Admitting: Anesthesiology

## 2015-09-23 ENCOUNTER — Emergency Department (HOSPITAL_COMMUNITY): Payer: 59

## 2015-09-23 ENCOUNTER — Encounter (HOSPITAL_COMMUNITY): Admission: EM | Disposition: A | Discharge status: Home / Self Care | Payer: Self-pay | Source: Home / Self Care

## 2015-09-23 ENCOUNTER — Inpatient Hospital Stay (HOSPITAL_COMMUNITY)
Admission: EM | Admit: 2015-09-23 | Discharge: 2015-09-28 | DRG: 345 | Disposition: A | Discharge status: Home / Self Care | Payer: 59 | Attending: Surgery | Admitting: Surgery

## 2015-09-23 ENCOUNTER — Encounter (HOSPITAL_COMMUNITY): Payer: Self-pay | Admitting: Family Medicine

## 2015-09-23 DIAGNOSIS — Z8659 Personal history of other mental and behavioral disorders: Secondary | ICD-10-CM

## 2015-09-23 DIAGNOSIS — K631 Perforation of intestine (nontraumatic): Principal | ICD-10-CM | POA: Diagnosis present

## 2015-09-23 DIAGNOSIS — R1013 Epigastric pain: Secondary | ICD-10-CM | POA: Diagnosis not present

## 2015-09-23 DIAGNOSIS — Z79899 Other long term (current) drug therapy: Secondary | ICD-10-CM

## 2015-09-23 DIAGNOSIS — D72829 Elevated white blood cell count, unspecified: Secondary | ICD-10-CM | POA: Diagnosis present

## 2015-09-23 DIAGNOSIS — Z809 Family history of malignant neoplasm, unspecified: Secondary | ICD-10-CM

## 2015-09-23 DIAGNOSIS — K668 Other specified disorders of peritoneum: Secondary | ICD-10-CM | POA: Diagnosis present

## 2015-09-23 DIAGNOSIS — K285 Chronic or unspecified gastrojejunal ulcer with perforation: Secondary | ICD-10-CM

## 2015-09-23 DIAGNOSIS — F509 Eating disorder, unspecified: Secondary | ICD-10-CM | POA: Diagnosis present

## 2015-09-23 DIAGNOSIS — F329 Major depressive disorder, single episode, unspecified: Secondary | ICD-10-CM | POA: Diagnosis present

## 2015-09-23 DIAGNOSIS — Z87891 Personal history of nicotine dependence: Secondary | ICD-10-CM

## 2015-09-23 DIAGNOSIS — Z800 Family history of malignant neoplasm of digestive organs: Secondary | ICD-10-CM

## 2015-09-23 DIAGNOSIS — Z8249 Family history of ischemic heart disease and other diseases of the circulatory system: Secondary | ICD-10-CM

## 2015-09-23 DIAGNOSIS — Z79891 Long term (current) use of opiate analgesic: Secondary | ICD-10-CM

## 2015-09-23 DIAGNOSIS — E785 Hyperlipidemia, unspecified: Secondary | ICD-10-CM | POA: Diagnosis present

## 2015-09-23 DIAGNOSIS — E039 Hypothyroidism, unspecified: Secondary | ICD-10-CM | POA: Diagnosis present

## 2015-09-23 DIAGNOSIS — I10 Essential (primary) hypertension: Secondary | ICD-10-CM | POA: Diagnosis present

## 2015-09-23 DIAGNOSIS — Z803 Family history of malignant neoplasm of breast: Secondary | ICD-10-CM

## 2015-09-23 DIAGNOSIS — Z9884 Bariatric surgery status: Secondary | ICD-10-CM

## 2015-09-23 DIAGNOSIS — K567 Ileus, unspecified: Secondary | ICD-10-CM | POA: Diagnosis not present

## 2015-09-23 HISTORY — PX: LAPAROSCOPY: SHX197

## 2015-09-23 HISTORY — DX: Chronic or unspecified gastrojejunal ulcer with perforation: K28.5

## 2015-09-23 HISTORY — PX: UPPER GI ENDOSCOPY: SHX6162

## 2015-09-23 LAB — COMPREHENSIVE METABOLIC PANEL
ALT: 42 U/L (ref 14–54)
AST: 23 U/L (ref 15–41)
Albumin: 4.2 g/dL (ref 3.5–5.0)
Alkaline Phosphatase: 58 U/L (ref 38–126)
Anion gap: 10 (ref 5–15)
BUN: 12 mg/dL (ref 6–20)
CO2: 23 mmol/L (ref 22–32)
Calcium: 9.6 mg/dL (ref 8.9–10.3)
Chloride: 105 mmol/L (ref 101–111)
Creatinine, Ser: 0.62 mg/dL (ref 0.44–1.00)
GFR calc Af Amer: >60 mL/min (ref >60)
GFR calc non Af Amer: >60 mL/min (ref >60)
Glucose, Bld: 121 mg/dL — ABNORMAL HIGH (ref 65–99)
Potassium: 4.3 mmol/L (ref 3.5–5.1)
Sodium: 138 mmol/L (ref 135–145)
Total Bilirubin: 0.8 mg/dL (ref 0.3–1.2)
Total Protein: 6.8 g/dL (ref 6.5–8.1)

## 2015-09-23 LAB — CBC WITH DIFFERENTIAL/PLATELET
Basophils Absolute: 0 10*3/uL (ref 0.0–0.1)
Basophils Relative: 0 %
Eosinophils Absolute: 0.1 10*3/uL (ref 0.0–0.7)
Eosinophils Relative: 0 %
HCT: 40.6 % (ref 36.0–46.0)
Hemoglobin: 13.7 g/dL (ref 12.0–15.0)
Lymphocytes Relative: 10 %
Lymphs Abs: 1.3 10*3/uL (ref 0.7–4.0)
MCH: 33.8 pg (ref 26.0–34.0)
MCHC: 33.7 g/dL (ref 30.0–36.0)
MCV: 100.2 fL — ABNORMAL HIGH (ref 78.0–100.0)
Monocytes Absolute: 0.7 10*3/uL (ref 0.1–1.0)
Monocytes Relative: 5 %
Neutro Abs: 10.7 10*3/uL — ABNORMAL HIGH (ref 1.7–7.7)
Neutrophils Relative %: 85 %
Platelets: 248 10*3/uL (ref 150–400)
RBC: 4.05 MIL/uL (ref 3.87–5.11)
RDW: 11.7 % (ref 11.5–15.5)
WBC: 12.7 10*3/uL — ABNORMAL HIGH (ref 4.0–10.5)

## 2015-09-23 LAB — I-STAT CG4 LACTIC ACID, ED
Lactic Acid, Venous: 1.18 mmol/L (ref 0.5–2.0)
Lactic Acid, Venous: 0.91 mmol/L (ref 0.5–2.0)

## 2015-09-23 LAB — URINALYSIS, ROUTINE W REFLEX MICROSCOPIC
Bilirubin Urine: NEGATIVE
Glucose, UA: NEGATIVE mg/dL
Hgb urine dipstick: NEGATIVE
Ketones, ur: 15 mg/dL — AB
Leukocytes, UA: NEGATIVE
Nitrite: NEGATIVE
Protein, ur: NEGATIVE mg/dL
Specific Gravity, Urine: >1.046 — ABNORMAL HIGH (ref 1.005–1.030)
pH: 5 (ref 5.0–8.0)

## 2015-09-23 LAB — I-STAT TROPONIN, ED: Troponin i, poc: 0 ng/mL (ref 0.00–0.08)

## 2015-09-23 LAB — I-STAT BETA HCG BLOOD, ED (MC, WL, AP ONLY): I-stat hCG, quantitative: <5 m[IU]/mL (ref <5)

## 2015-09-23 LAB — LIPASE, BLOOD: Lipase: 20 U/L (ref 11–51)

## 2015-09-23 SURGERY — LAPAROSCOPY, DIAGNOSTIC
Anesthesia: General

## 2015-09-23 MED ORDER — LACTATED RINGERS IV SOLN
INTRAVENOUS | Status: DC
  Administered 2015-09-23: 15:00:00 via INTRAVENOUS
  Administered 2015-09-23: 1000 mL via INTRAVENOUS

## 2015-09-23 MED ORDER — PANTOPRAZOLE SODIUM 40 MG IV SOLR
40.0000 mg | Freq: Every day | INTRAVENOUS | Status: DC
  Filled 2015-09-23: qty 40

## 2015-09-23 MED ORDER — LAMOTRIGINE 100 MG PO TABS
100.0000 mg | ORAL_TABLET | Freq: Every day | ORAL | Status: DC
  Administered 2015-09-23 – 2015-09-27 (×4): 100 mg via ORAL
  Filled 2015-09-23 (×7): qty 1

## 2015-09-23 MED ORDER — HEPARIN SODIUM (PORCINE) 5000 UNIT/ML IJ SOLN
5000.0000 [IU] | Freq: Three times a day (TID) | INTRAMUSCULAR | Status: DC
  Administered 2015-09-23 – 2015-09-28 (×16): 5000 [IU] via SUBCUTANEOUS
  Filled 2015-09-23 (×18): qty 1

## 2015-09-23 MED ORDER — DEXTROSE 5 % IV SOLN
2.0000 g | INTRAVENOUS | Status: DC
  Administered 2015-09-23 – 2015-09-27 (×5): 2 g via INTRAVENOUS
  Filled 2015-09-23 (×6): qty 2

## 2015-09-23 MED ORDER — PANTOPRAZOLE SODIUM 40 MG IV SOLR
40.0000 mg | Freq: Two times a day (BID) | INTRAVENOUS | Status: DC
  Administered 2015-09-23 – 2015-09-27 (×9): 40 mg via INTRAVENOUS
  Filled 2015-09-23 (×11): qty 40

## 2015-09-23 MED ORDER — ACETAMINOPHEN 500 MG PO TABS: 1000.0000 mg | ORAL_TABLET | Freq: Three times a day (TID) | ORAL | Status: DC

## 2015-09-23 MED ORDER — HYDROMORPHONE HCL 1 MG/ML IJ SOLN
1.0000 mg | Freq: Once | INTRAMUSCULAR | Status: AC
  Administered 2015-09-23: 1 mg via INTRAVENOUS

## 2015-09-23 MED ORDER — ONDANSETRON 4 MG PO TBDP
4.0000 mg | ORAL_TABLET | Freq: Four times a day (QID) | ORAL | Status: DC | PRN
  Filled 2015-09-23: qty 1

## 2015-09-23 MED ORDER — ONDANSETRON 4 MG PO TBDP
4.0000 mg | ORAL_TABLET | Freq: Four times a day (QID) | ORAL | Status: DC | PRN
  Administered 2015-09-27: 4 mg via ORAL
  Filled 2015-09-23: qty 1

## 2015-09-23 MED ORDER — PIPERACILLIN-TAZOBACTAM 3.375 G IVPB: 3.3750 g | Freq: Three times a day (TID) | INTRAVENOUS | Status: DC

## 2015-09-23 MED ORDER — TRAZODONE HCL 150 MG PO TABS: 150.0000 mg | ORAL_TABLET | Freq: Every day | ORAL | Status: DC

## 2015-09-23 MED ORDER — SODIUM CHLORIDE 0.9 % IV SOLN: INTRAVENOUS | Status: DC

## 2015-09-23 MED ORDER — LACTATED RINGERS IV SOLN
INTRAVENOUS | Status: DC
  Administered 2015-09-23: 17:00:00 via INTRAVENOUS

## 2015-09-23 MED ORDER — LEVOTHYROXINE SODIUM 112 MCG PO TABS
112.0000 ug | ORAL_TABLET | Freq: Every day | ORAL | Status: DC
  Filled 2015-09-23 (×2): qty 1

## 2015-09-23 MED ORDER — HYDROMORPHONE HCL 1 MG/ML IJ SOLN
INTRAMUSCULAR | Status: AC
  Filled 2015-09-23: qty 1

## 2015-09-23 MED ORDER — LEVOTHYROXINE SODIUM 112 MCG PO CAPS: 1.0000 | ORAL_CAPSULE | Freq: Every morning | ORAL | Status: DC

## 2015-09-23 MED ORDER — LIDOCAINE HCL (CARDIAC) 20 MG/ML IV SOLN
INTRAVENOUS | Status: DC | PRN
  Administered 2015-09-23: 50 mg via INTRAVENOUS

## 2015-09-23 MED ORDER — PANTOPRAZOLE SODIUM 40 MG IV SOLR: 40.0000 mg | Freq: Two times a day (BID) | INTRAVENOUS | Status: DC

## 2015-09-23 MED ORDER — SODIUM CHLORIDE 0.9 % IV BOLUS (SEPSIS)
1000.0000 mL | Freq: Once | INTRAVENOUS | Status: AC
  Administered 2015-09-23: 1000 mL via INTRAVENOUS

## 2015-09-23 MED ORDER — LACTATED RINGERS IV SOLN
INTRAVENOUS | Status: DC
  Administered 2015-09-23 – 2015-09-25 (×3): via INTRAVENOUS

## 2015-09-23 MED ORDER — PROPOFOL 10 MG/ML IV BOLUS
INTRAVENOUS | Status: DC | PRN
  Administered 2015-09-23: 40 mg via INTRAVENOUS
  Administered 2015-09-23: 120 mg via INTRAVENOUS

## 2015-09-23 MED ORDER — PANTOPRAZOLE SODIUM 40 MG PO TBEC: 40.0000 mg | DELAYED_RELEASE_TABLET | Freq: Every day | ORAL | Status: DC

## 2015-09-23 MED ORDER — ACETAMINOPHEN 500 MG PO TABS: 1000.0000 mg | ORAL_TABLET | Freq: Four times a day (QID) | ORAL | Status: DC | PRN

## 2015-09-23 MED ORDER — ROCURONIUM BROMIDE 100 MG/10ML IV SOLN
INTRAVENOUS | Status: DC | PRN
  Administered 2015-09-23: 10 mg via INTRAVENOUS
  Administered 2015-09-23: 30 mg via INTRAVENOUS

## 2015-09-23 MED ORDER — METRONIDAZOLE IN NACL 5-0.79 MG/ML-% IV SOLN
500.0000 mg | Freq: Four times a day (QID) | INTRAVENOUS | Status: DC
  Administered 2015-09-23 – 2015-09-28 (×19): 500 mg via INTRAVENOUS
  Filled 2015-09-23 (×21): qty 100

## 2015-09-23 MED ORDER — CETYLPYRIDINIUM CHLORIDE 0.05 % MT LIQD
7.0000 mL | Freq: Two times a day (BID) | OROMUCOSAL | Status: DC
  Administered 2015-09-23 – 2015-09-25 (×3): 7 mL via OROMUCOSAL

## 2015-09-23 MED ORDER — FENTANYL CITRATE (PF) 100 MCG/2ML IJ SOLN
INTRAMUSCULAR | Status: DC | PRN
  Administered 2015-09-23 (×2): 50 ug via INTRAVENOUS

## 2015-09-23 MED ORDER — BUPIVACAINE LIPOSOME 1.3 % IJ SUSP
20.0000 mL | Freq: Once | INTRAMUSCULAR | Status: AC
  Administered 2015-09-23: 20 mL
  Filled 2015-09-23: qty 20

## 2015-09-23 MED ORDER — HYDRALAZINE HCL 20 MG/ML IJ SOLN: 10.0000 mg | INTRAMUSCULAR | Status: DC | PRN

## 2015-09-23 MED ORDER — MORPHINE SULFATE (PF) 4 MG/ML IV SOLN
4.0000 mg | Freq: Once | INTRAVENOUS | Status: AC
  Administered 2015-09-23: 4 mg via INTRAVENOUS

## 2015-09-23 MED ORDER — ONDANSETRON HCL 4 MG/2ML IJ SOLN
4.0000 mg | Freq: Four times a day (QID) | INTRAMUSCULAR | Status: DC | PRN
  Administered 2015-09-25 – 2015-09-27 (×5): 4 mg via INTRAVENOUS
  Filled 2015-09-23 (×5): qty 2

## 2015-09-23 MED ORDER — LACTATED RINGERS IV SOLN
INTRAVENOUS | Status: DC
  Administered 2015-09-23: 1000 mL via INTRAVENOUS

## 2015-09-23 MED ORDER — KCL IN DEXTROSE-NACL 20-5-0.45 MEQ/L-%-% IV SOLN
INTRAVENOUS | Status: DC
Start: 2015-09-23 — End: 2015-09-28
  Administered 2015-09-23 – 2015-09-26 (×3): via INTRAVENOUS
  Administered 2015-09-26: 100 mL/h via INTRAVENOUS
  Administered 2015-09-27 (×2): via INTRAVENOUS
  Filled 2015-09-23 (×12): qty 1000

## 2015-09-23 MED ORDER — NEOSTIGMINE METHYLSULFATE 10 MG/10ML IV SOLN
INTRAVENOUS | Status: DC | PRN
  Administered 2015-09-23: 3 mg via INTRAVENOUS

## 2015-09-23 MED ORDER — DEXAMETHASONE SODIUM PHOSPHATE 10 MG/ML IJ SOLN
INTRAMUSCULAR | Status: DC | PRN
  Administered 2015-09-23: 10 mg via INTRAVENOUS

## 2015-09-23 MED ORDER — SUCCINYLCHOLINE CHLORIDE 20 MG/ML IJ SOLN
INTRAMUSCULAR | Status: DC | PRN
  Administered 2015-09-23: 100 mg via INTRAVENOUS

## 2015-09-23 MED ORDER — LACTATED RINGERS IV BOLUS (SEPSIS)
1000.0000 mL | Freq: Once | INTRAVENOUS | Status: AC
  Administered 2015-09-23: 1000 mL via INTRAVENOUS

## 2015-09-23 MED ORDER — ONDANSETRON HCL 4 MG/2ML IJ SOLN
4.0000 mg | Freq: Four times a day (QID) | INTRAMUSCULAR | Status: DC | PRN
  Administered 2015-09-23: 4 mg via INTRAVENOUS

## 2015-09-23 MED ORDER — IOHEXOL 300 MG/ML  SOLN
100.0000 mL | Freq: Once | INTRAMUSCULAR | Status: AC | PRN
  Administered 2015-09-23: 100 mL via INTRAVENOUS

## 2015-09-23 MED ORDER — LACTATED RINGERS IV BOLUS (SEPSIS)
1000.0000 mL | Freq: Three times a day (TID) | INTRAVENOUS | Status: AC | PRN
  Administered 2015-09-24: 1000 mL via INTRAVENOUS
  Filled 2015-09-23: qty 1000

## 2015-09-23 MED ORDER — HYDROMORPHONE HCL 1 MG/ML IJ SOLN: 0.5000 mg | INTRAMUSCULAR | Status: DC | PRN

## 2015-09-23 MED ORDER — GLYCOPYRROLATE 0.2 MG/ML IJ SOLN
INTRAMUSCULAR | Status: DC | PRN
  Administered 2015-09-23: 0.4 mg via INTRAVENOUS

## 2015-09-23 MED ORDER — HYDROMORPHONE HCL 1 MG/ML IJ SOLN
0.2500 mg | INTRAMUSCULAR | Status: DC | PRN
Start: 2015-09-23 — End: 2015-09-23
  Administered 2015-09-23: 0.5 mg via INTRAVENOUS

## 2015-09-23 MED ORDER — BUSPIRONE HCL 15 MG PO TABS: 30.0000 mg | ORAL_TABLET | Freq: Two times a day (BID) | ORAL | Status: DC

## 2015-09-23 MED ORDER — PIPERACILLIN-TAZOBACTAM 3.375 G IVPB 30 MIN
3.3750 g | Freq: Once | INTRAVENOUS | Status: AC
  Administered 2015-09-23: 3.375 g via INTRAVENOUS
  Filled 2015-09-23: qty 50

## 2015-09-23 MED ORDER — IOHEXOL 300 MG/ML  SOLN
25.0000 mL | Freq: Once | INTRAMUSCULAR | Status: AC | PRN
  Administered 2015-09-23: 25 mL via ORAL

## 2015-09-23 MED ORDER — CHLORHEXIDINE GLUCONATE 0.12 % MT SOLN
15.0000 mL | Freq: Two times a day (BID) | OROMUCOSAL | Status: DC
  Administered 2015-09-23 – 2015-09-25 (×4): 15 mL via OROMUCOSAL
  Filled 2015-09-23 (×11): qty 15

## 2015-09-23 MED ORDER — SODIUM CHLORIDE 0.9 % IV SOLN
3.0000 g | Freq: Four times a day (QID) | INTRAVENOUS | Status: DC
  Filled 2015-09-23 (×5): qty 3

## 2015-09-23 MED ORDER — HYDROMORPHONE HCL 1 MG/ML IJ SOLN
0.5000 mg | INTRAMUSCULAR | Status: DC | PRN
  Administered 2015-09-23: 1 mg via INTRAVENOUS
  Administered 2015-09-24 (×2): 2 mg via INTRAVENOUS
  Administered 2015-09-24: 1 mg via INTRAVENOUS
  Administered 2015-09-24: 2 mg via INTRAVENOUS
  Administered 2015-09-24: 1 mg via INTRAVENOUS
  Administered 2015-09-24: 2 mg via INTRAVENOUS
  Administered 2015-09-25 (×3): 1 mg via INTRAVENOUS
  Administered 2015-09-25: 2 mg via INTRAVENOUS
  Administered 2015-09-26 (×3): 1 mg via INTRAVENOUS
  Administered 2015-09-27: 0.5 mg via INTRAVENOUS
  Administered 2015-09-27 (×2): 1 mg via INTRAVENOUS
  Filled 2015-09-23 (×3): qty 1
  Filled 2015-09-23 (×2): qty 2
  Filled 2015-09-23 (×2): qty 1
  Filled 2015-09-23: qty 2
  Filled 2015-09-23: qty 1
  Filled 2015-09-23: qty 2
  Filled 2015-09-23 (×4): qty 1
  Filled 2015-09-23: qty 2
  Filled 2015-09-23: qty 1
  Filled 2015-09-23: qty 2

## 2015-09-23 MED ORDER — FLUOXETINE HCL 20 MG PO TABS: 60.0000 mg | ORAL_TABLET | Freq: Every day | ORAL | Status: DC

## 2015-09-23 SURGICAL SUPPLY — 54 items
APL SKNCLS STERI-STRIP NONHPOA (GAUZE/BANDAGES/DRESSINGS)
APPLICATOR COTTON TIP 6IN STRL (MISCELLANEOUS) ×2 IMPLANT
BENZOIN TINCTURE PRP APPL 2/3 (GAUZE/BANDAGES/DRESSINGS) IMPLANT
BLADE EXTENDED COATED 6.5IN (ELECTRODE) IMPLANT
BLADE HEX COATED 2.75 (ELECTRODE) ×2 IMPLANT
COVER MAYO STAND STRL (DRAPES) ×2 IMPLANT
COVER SURGICAL LIGHT HANDLE (MISCELLANEOUS) ×4 IMPLANT
DECANTER SPIKE VIAL GLASS SM (MISCELLANEOUS) IMPLANT
DRAIN CHANNEL 19F RND (DRAIN) IMPLANT
DRAPE LAPAROSCOPIC ABDOMINAL (DRAPES) ×2 IMPLANT
DRAPE WARM FLUID 44X44 (DRAPE) ×2 IMPLANT
ELECT REM PT RETURN 9FT ADLT (ELECTROSURGICAL) ×2
ELECTRODE REM PT RTRN 9FT ADLT (ELECTROSURGICAL) ×1 IMPLANT
EVACUATOR SILICONE 100CC (DRAIN) IMPLANT
GAUZE SPONGE 4X4 12PLY STRL (GAUZE/BANDAGES/DRESSINGS) ×2 IMPLANT
GLOVE BIOGEL M 8.0 STRL (GLOVE) ×2 IMPLANT
GLOVE BIOGEL PI IND STRL 7.0 (GLOVE) ×1 IMPLANT
GLOVE BIOGEL PI INDICATOR 7.0 (GLOVE) ×1
GOWN SPEC L4 XLG W/TWL (GOWN DISPOSABLE) ×2 IMPLANT
GOWN STRL REUS W/TWL LRG LVL3 (GOWN DISPOSABLE) ×2 IMPLANT
GOWN STRL REUS W/TWL XL LVL3 (GOWN DISPOSABLE) ×6 IMPLANT
KIT BASIN OR (CUSTOM PROCEDURE TRAY) ×2 IMPLANT
LIGASURE IMPACT 36 18CM CVD LR (INSTRUMENTS) IMPLANT
NS IRRIG 1000ML POUR BTL (IV SOLUTION) ×4 IMPLANT
PACK GENERAL/GYN (CUSTOM PROCEDURE TRAY) ×2 IMPLANT
SET IRRIG TUBING LAPAROSCOPIC (IRRIGATION / IRRIGATOR) ×1 IMPLANT
SHEARS HARMONIC ACE PLUS 36CM (ENDOMECHANICALS) IMPLANT
SLEEVE Z-THREAD 5X100MM (TROCAR) ×2 IMPLANT
SOLUTION ANTI FOG 6CC (MISCELLANEOUS) ×1 IMPLANT
SPONGE LAP 18X18 X RAY DECT (DISPOSABLE) ×4 IMPLANT
STAPLER VISISTAT 35W (STAPLE) ×2 IMPLANT
STRIP CLOSURE SKIN 1/2X4 (GAUZE/BANDAGES/DRESSINGS) IMPLANT
SUCTION POOLE TIP (SUCTIONS) IMPLANT
SUT PDS AB 1 CTX 36 (SUTURE) IMPLANT
SUT SILK 2 0 (SUTURE) ×2
SUT SILK 2 0 SH CR/8 (SUTURE) ×2 IMPLANT
SUT SILK 2-0 18XBRD TIE 12 (SUTURE) ×1 IMPLANT
SUT SILK 3 0 (SUTURE) ×2
SUT SILK 3 0 SH CR/8 (SUTURE) ×2 IMPLANT
SUT SILK 3-0 18XBRD TIE 12 (SUTURE) ×1 IMPLANT
SUT VIC AB 3-0 SH 18 (SUTURE) IMPLANT
SUT VIC AB 4-0 SH 18 (SUTURE) IMPLANT
SUT VICRYL 2 0 18  UND BR (SUTURE)
SUT VICRYL 2 0 18 UND BR (SUTURE) IMPLANT
SYR 30ML LL (SYRINGE) ×2 IMPLANT
TOWEL OR 17X26 10 PK STRL BLUE (TOWEL DISPOSABLE) ×2 IMPLANT
TOWEL OR NON WOVEN STRL DISP B (DISPOSABLE) ×2 IMPLANT
TRAY FOLEY W/METER SILVER 14FR (SET/KITS/TRAYS/PACK) ×2 IMPLANT
TRAY FOLEY W/METER SILVER 16FR (SET/KITS/TRAYS/PACK) ×1 IMPLANT
TRAY LAPAROSCOPIC (CUSTOM PROCEDURE TRAY) ×2 IMPLANT
TROCAR BLADELESS OPT 5 100 (ENDOMECHANICALS) IMPLANT
TROCAR XCEL NON-BLD 11X100MML (ENDOMECHANICALS) IMPLANT
TROCAR XCEL UNIV SLVE 11M 100M (ENDOMECHANICALS) IMPLANT
TUBING INSUFFLATION 10FT LAP (TUBING) ×2 IMPLANT

## 2015-09-23 NOTE — Progress Notes (Signed)
Patient does not have diet order. MD paged.

## 2015-09-23 NOTE — Anesthesia Preprocedure Evaluation (Addendum)
Anesthesia Evaluation  Patient identified by MRN, date of birth, ID band Patient awake    Reviewed: Allergy & Precautions, H&P , NPO status , Patient's Chart, lab work & pertinent test results  Airway Mallampati: II  TM Distance: >3 FB Neck ROM: full    Dental no notable dental hx. (+) Teeth Intact, Dental Advisory Given   Pulmonary neg pulmonary ROS, former smoker,    Pulmonary exam normal breath sounds clear to auscultation       Cardiovascular Exercise Tolerance: Good hypertension, Pt. on medications Normal cardiovascular exam Rhythm:regular Rate:Normal     Neuro/Psych negative neurological ROS  negative psych ROS   GI/Hepatic negative GI ROS, Neg liver ROS,   Endo/Other  Hypothyroidism   Renal/GU negative Renal ROS  negative genitourinary   Musculoskeletal   Abdominal (+) - obese,   Peds  Hematology negative hematology ROS (+)   Anesthesia Other Findings   Reproductive/Obstetrics negative OB ROS                           Anesthesia Physical Anesthesia Plan  ASA: II and emergent  Anesthesia Plan: General   Post-op Pain Management:    Induction: Intravenous, Rapid sequence and Cricoid pressure planned  Airway Management Planned: Oral ETT  Additional Equipment:   Intra-op Plan:   Post-operative Plan: Extubation in OR  Informed Consent:   Plan Discussed with: Surgeon  Anesthesia Plan Comments:        Anesthesia Quick Evaluation

## 2015-09-23 NOTE — ED Notes (Signed)
She is now relaxed in appearance and tells me she feels "much better".

## 2015-09-23 NOTE — Brief Op Note (Signed)
09/23/2015  3:25 PM  PATIENT:  Aron Baba  49 y.o. female  PRE-OPERATIVE DIAGNOSIS:  perforated viscus  POST-OPERATIVE DIAGNOSIS:  perforated viscus  PROCEDURE:  Procedure(s): LAPAROSCOPY DIAGNOSTIC placement of drain (N/A) UPPER GI ENDOSCOPY (N/A)  SURGEON:  Surgeon(s) and Role:    * Johnathan Hausen, MD - Primary  PHYSICIAN ASSISTANT:   ASSISTANTS: none   ANESTHESIA:   general  EBL:  Total I/O In: 3000 [I.V.:3000] Out: 210 [Urine:200; Blood:10]  BLOOD ADMINISTERED:none  DRAINS: Penrose drain in the left upper quadrant over the gastrojejunostomy   LOCAL MEDICATIONS USED:  BUPIVICAINE   SPECIMEN:  No Specimen  DISPOSITION OF SPECIMEN:  N/A  COUNTS:  YES  TOURNIQUET:  * No tourniquets in log *  DICTATION: .Other Dictation: Dictation Number [id number]  PLAN OF CARE: Admit to inpatient   PATIENT DISPOSITION:  PACU - hemodynamically stable.   Delay start of Pharmacological VTE agent (>24hrs) due to surgical blood loss or risk of bleeding: no

## 2015-09-23 NOTE — H&P (Signed)
[facility] Admission Note  Kimberly Stanley 1966/02/14  [id number].    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 Hassell Done in our office.  She recently saw Dr. Hassell Done due to abdominal pain which has been going on for the past month.  She was started on Protonix and Carafate.  She says she hasn't seen any improvement and has gradually worsened.  Last night she had sudden onset of severe 10/10 crampy abdominal pain at 1am.  [facility] the [facility].  The epigastric pain radiates to her left shoulder and across her entire abdomen.  C/o nausea and dry heaving, but not able to vomit.  Admits to chills, SOB.  No fevers, diarrhea, constipation, CP, dysuria.  Last BM was yesterday and last meal last night was salad.  Denies any precipitating/alleviating factors.  She says she's cut back on acidic foods because she thought it may be due to her lemon intake.  No sodas or energy drinks.  She does not smoke or drink alcohol.  No excessive NSAIDS.  Drinks 2 large cups of coffee/day.  Son and daughter accompany her.     ROS: All systems reviewed and otherwise negative except for as above  Family History  Problem Relation Age of Onset  . Cancer Mother     breast  . Cancer Paternal Uncle     panctratic  . Arthritis Other   . Cancer Other   . Hyperlipidemia Other   . Hypertension Other   . Obesity Other     Past Medical History  Diagnosis Date  . Thyroid disease   . Hypertension   . Hypothyroidism   . Depression   . Eating disorder     loose alot of weight since bypass  . Wears contact lenses     Past Surgical History  Procedure Laterality Date  . Cholecystectomy    . Dilation and curettage of uterus    . Tubal ligation    . 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 Superficial peroneal nerve release Left 10/24/2014    Procedure: SUPERFICIAL PERONEAL NERVE RELEASE LEFT;  Surgeon: Alta Corning, MD;  Location: [place];  Service: Orthopedics;  Laterality: Left;    Social History:  reports that she quit smoking about 2 years ago. Her smoking use included Cigarettes. She has a 7.5 pack-year smoking history. She has never used smokeless tobacco. She reports that she uses illicit drugs ("Crack" cocaine). She reports that she does not drink alcohol.  Allergies: No Known Allergies   (Not in a hospital admission)  Blood pressure 102/63, pulse 72, temperature 98.5 F (36.9 C), temperature source Oral, resp. rate 14, height '5\' 5"'  (1.651 m), weight 64.864 kg (143 lb), SpO2 98 %. Physical Exam: General: pleasant, WD/WN white female who is laying in bed in NAD HEENT: head is normocephalic, atraumatic.  Sclera are noninjected.  PERRL.  Ears and nose without any masses or lesions.  Mouth is pink and moist Heart: regular, rate, and rhythm.  No obvious murmurs, gallops, or rubs noted.  Palpable pedal pulses bilaterally Lungs: CTAB, no wheezes, rhonchi, or rales  noted.  Respiratory effort nonlabored Abd: soft, minimal distension, exquisite tenderness in the epigastrium and RUQ/LUQ, but some what tender throughout, negative rebound, no guarding (just got pain meds), +BS, no masses, hernias, or organomegaly, laparoscopic scars not well visable MS: all 4 extremities are symmetrical with no cyanosis, clubbing, or edema. Skin: warm and dry with no masses, lesions, or rashes Psych: A&Ox3 with an appropriate affect.   Results for orders placed or performed during the hospital encounter of 09/23/15 (from the past 48 hour(s))  CBC with Differential/Platelet     Status: Abnormal   Collection Time:  09/23/15  6:42 AM  Result Value Ref Range   WBC 12.7 (H) 4.0 - 10.5 K/uL   RBC 4.05 3.87 - 5.11 MIL/uL   Hemoglobin 13.7 12.0 - 15.0 g/dL   HCT 40.6 36.0 - 46.0 %   MCV 100.2 (H) 78.0 - 100.0 fL   MCH 33.8 26.0 - 34.0 pg   MCHC 33.7 30.0 - 36.0 g/dL   RDW 11.7 11.5 - 15.5 %   Platelets 248 150 - 400 K/uL   Neutrophils Relative % 85 %   Neutro Abs 10.7 (H) 1.7 - 7.7 K/uL   Lymphocytes Relative 10 %   Lymphs Abs 1.3 0.7 - 4.0 K/uL   Monocytes Relative 5 %   Monocytes Absolute 0.7 0.1 - 1.0 K/uL   Eosinophils Relative 0 %   Eosinophils Absolute 0.1 0.0 - 0.7 K/uL   Basophils Relative 0 %   Basophils Absolute 0.0 0.0 - 0.1 K/uL  Comprehensive metabolic panel     Status: Abnormal   Collection Time: 09/23/15  6:42 AM  Result Value Ref Range   Sodium 138 135 - 145 mmol/L   Potassium 4.3 3.5 - 5.1 mmol/L   Chloride 105 101 - 111 mmol/L   CO2 23 22 - 32 mmol/L   Glucose, Bld 121 (H) 65 - 99 mg/dL   BUN 12 6 - 20 mg/dL   Creatinine, Ser 0.62 0.44 - 1.00 mg/dL   Calcium 9.6 8.9 - 10.3 mg/dL   Total Protein 6.8 6.5 - 8.1 g/dL   Albumin 4.2 3.5 - 5.0 g/dL   AST 23 15 - 41 U/L   ALT 42 14 - 54 U/L   Alkaline Phosphatase 58 38 - 126 U/L   Total Bilirubin 0.8 0.3 - 1.2 mg/dL   GFR calc non Af Amer >60 >60 mL/min   GFR calc Af Amer >60 >60 mL/min    Comment: (NOTE) The eGFR has been calculated using the CKD EPI equation. This calculation has not been validated in all clinical situations. eGFR's persistently <60 mL/min signify possible Chronic Kidney Disease.    Anion gap 10 5 - 15  Lipase, blood     Status: None   Collection Time: 09/23/15  6:42 AM  Result Value Ref Range   Lipase 20 11 - 51 U/L  I-Stat Troponin, ED (not at [facility])     Status: None   Collection Time: 09/23/15  6:47 AM  Result Value Ref Range   Troponin i, poc 0.00 0.00 - 0.08 ng/mL   Comment 3            Comment: Due to the release kinetics of cTnI, a negative result within the first hours of the onset of  symptoms does not rule out myocardial infarction with certainty. If myocardial infarction is still suspected, repeat the test at appropriate intervals.   I-Stat Beta hCG blood, ED ([facility], [facility], [facility] only)  Status: None   Collection Time: 09/23/15  8:20 AM  Result Value Ref Range   I-stat hCG, quantitative <5.0 <5 mIU/mL   Comment 3            Comment:   GEST. AGE      CONC.  (mIU/mL)   <=1 WEEK        5 - 50     2 WEEKS       50 - 500     3 WEEKS       100 - 10,000     4 WEEKS     1,000 - 30,000        FEMALE AND NON-PREGNANT FEMALE:     LESS THAN 5 mIU/mL   I-Stat CG4 Lactic Acid, ED     Status: None   Collection Time: 09/23/15  9:44 AM  Result Value Ref Range   Lactic Acid, Venous 1.18 0.5 - 2.0 mmol/L  I-Stat CG4 Lactic Acid, ED     Status: None   Collection Time: 09/23/15 12:47 PM  Result Value Ref Range   Lactic Acid, Venous 0.91 0.5 - 2.0 mmol/L   Ct Abdomen Pelvis W Contrast  09/23/2015  CLINICAL DATA:  Diffuse abdominal pain radiating to LEFT shoulder. Gastric bypass Roux-en-Y surgery. Urine ablation. EXAM: CT ABDOMEN AND PELVIS WITH CONTRAST TECHNIQUE: Multidetector CT imaging of the abdomen and pelvis was performed using the standard protocol following bolus administration of intravenous contrast. CONTRAST:  168m OMNIPAQUE IOHEXOL 300 MG/ML  SOLN COMPARISON:  CT 07/20/2010 FINDINGS: Lower chest: Lung bases are clear. Hepatobiliary: No focal hepatic lesion. Postcholecystectomy. No biliary dilatation. Pancreas: Pancreas is normal. No ductal dilatation. No pancreatic inflammation. Spleen: Normal spleen Adrenals/urinary tract: Adrenal glands and kidneys are normal. The ureters and bladder normal. Stomach/Bowel: Post bariatric surgery with gastric Roux-en-Y anatomy. No evidence of obstruction. There is not a small moderate volume intraperitoneal free air in the upper abdomen. Some of a free air is adjacent to the the gastrojejunostomy (image 22, series 2). No evidence of leak of oral  contrast. The more distal enteric enteric anastomosis (image 60, series 2) appears intact. There are several small gas pockets adjacent to the enteric enteric anastomosis. Appendix is not identified. Moderate volume stool in the RIGHT colon. LEFT colon rectosigmoid colon are normal. Vascular/Lymphatic: Abdominal aorta is normal caliber. There is no retroperitoneal or periportal lymphadenopathy. No pelvic lymphadenopathy. Reproductive: Uterus and ovaries are normal. Other: Small amount free fluid the posterior cul-de-sac (image 75, series 2) is relatively high density. Musculoskeletal: No aggressive osseous lesion. IMPRESSION: 1. Intraperitoneal free air without clear defined source. 2. Post gastric bypass surgery. Potential leaking site would be the gastrojejunostomy. Less likely a source would be the enteric enteric anastomosis. 3. Small amount of intraperitoneal free fluid in the pelvis is relatively high density which could indicate leakage of oral contrast. Hemoperitoneum is felt less likely. Findings conveyed toLittle, MD on 09/23/2015  at09:27. Electronically Signed   By: SSuzy BouchardM.D.   On: 09/23/2015 09:27   Dg Chest Port 1 View  09/23/2015  CLINICAL DATA:  Left upper abdominal pain, nausea and difficulty breathing. Progressive symptoms for 5 hours. EXAM: PORTABLE CHEST 1 VIEW COMPARISON:  09/25/2013 FINDINGS: Lung volumes are low. Mild elevation of left hemidiaphragm. Mild bibasilar atelectasis. No consolidation to suggest pneumonia. Cardiomediastinal contours are normal. No large pleural effusion or pneumothorax. No acute osseous abnormalities are seen. There is gaseous distention of bowel loops in the left upper quadrant of the  abdomen. IMPRESSION: Low lung volumes with mild bibasilar atelectasis. Gaseous distention of bowel loops in the left upper abdomen. Electronically Signed   By: Jeb Levering M.D.   On: 09/23/2015 06:48      Assessment/Plan Acute severe abdominal pain Free  intra-abdominal air ?marginal ulcer perforation? Leukocytosis - 12.7 -Admit to [facility], urgent diagnostic laparoscopy, possible open, possible bowel resection -NPO, bowel rest, IVF, pain control, antiemetics, antibiotics (Zosyn) -SCD's and hold chemical proph for OR -Discussed risks/benefits of proceeding with surgery.  The patient and her family would like to proceed.  Discussed post-op course possibilities.    H/o roux en y - Dr. Lilyan Punt 2014 H/o cholecystectomy   Nat Christen, [facility] Surgery 09/23/2015, 1:10 PM Pager: [phone]

## 2015-09-23 NOTE — ED Notes (Signed)
I find her to be tense and uncomfortable.  Provider notified and order rec'd.

## 2015-09-23 NOTE — Anesthesia Procedure Notes (Signed)
Procedure Name: Intubation Date/Time: 09/23/2015 2:03 PM Performed by: Glory Buff Pre-anesthesia Checklist: Patient identified, Emergency Drugs available, Suction available and Patient being monitored Patient Re-evaluated:Patient Re-evaluated prior to inductionOxygen Delivery Method: Circle System Utilized Preoxygenation: Pre-oxygenation with 100% oxygen Intubation Type: IV induction Ventilation: Mask ventilation without difficulty Laryngoscope Size: Miller and 2 Grade View: Grade I Tube type: Oral Tube size: 7.0 mm Number of attempts: 1 Airway Equipment and Method: Stylet and Oral airway Placement Confirmation: ETT inserted through vocal cords under direct vision,  positive ETCO2 and breath sounds checked- equal and bilateral Secured at: 20 cm Tube secured with: Tape Dental Injury: Teeth and Oropharynx as per pre-operative assessment

## 2015-09-23 NOTE — ED Provider Notes (Signed)
CSN: [id number]     Arrival date & time 09/23/15  [id number] History   First MD Initiated Contact with Patient 09/23/15 [id number]     Chief Complaint  Patient presents with  . Abdominal Pain     (Consider location/radiation/quality/duration/timing/severity/associated sxs/prior Treatment) HPI Comments: Patient with PMH of HTN, hypothyroid presents to the ED with a chief complaint of abdominal pain.  Onset was suddenly last night at 1:00am.  The pain is mostly located on the left side of her abdomen.  She states that it radiates to her left shoulder.  She endorses some shortness of breat (due to pain).  She reports associated non-bloody, non-bilious nausea and vomiting.  Denies any diarrhea, constipation, dysuria.  She has not taken anything for her symptoms.  Symptoms are aggravated with palpation and relieved with nothing.  The history is provided by the patient. No language interpreter was used.    Past Medical History  Diagnosis Date  . Thyroid disease   . Hypertension   . Hypothyroidism   . Depression   . Eating disorder     loose alot of weight since bypass  . Wears contact lenses    Past Surgical History  Procedure Laterality Date  . Cholecystectomy    . Dilation and curettage of uterus    . Tubal ligation    . 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 Superficial peroneal nerve release Left 10/24/2014    Procedure: SUPERFICIAL PERONEAL NERVE RELEASE LEFT;  Surgeon: Alta Corning, MD;  Location: [place];  Service: Orthopedics;  Laterality: Left;   Family History  Problem Relation Age of Onset  . Cancer Mother     breast  . Cancer Paternal Uncle     panctratic  . Arthritis Other   . Cancer Other   . Hyperlipidemia Other   . Hypertension Other   . Obesity Other    Social History   Substance Use Topics  . Smoking status: Former Smoker -- 0.50 packs/day for 15 years    Types: Cigarettes    Quit date: 07/05/2013  . Smokeless tobacco: Never Used  . Alcohol Use: No   OB History    No data available     Review of Systems  Constitutional: Negative for fever and chills.  Respiratory: Negative for shortness of breath.   Cardiovascular: Negative for chest pain.  Gastrointestinal: Positive for nausea, vomiting and abdominal pain. Negative for diarrhea and constipation.  Genitourinary: Negative for dysuria.  All other systems reviewed and are negative.     Allergies  Review of patient's allergies indicates no known allergies.  Home Medications   Prior to Admission medications   Medication Sig Start Date End Date Taking? Authorizing Provider  acetaminophen (TYLENOL) 500 MG tablet Take 1,000 mg by mouth every 6 (six) hours as needed for mild pain.   Yes Historical Provider, MD  busPIRone (BUSPAR) 15 MG tablet Take 30 mg by mouth 2 (two) times daily.  06/16/13  Yes Historical Provider, MD  FLUoxetine (PROZAC) 20 MG tablet Take 60 mg by mouth daily.  07/29/14  Yes Historical Provider, MD  lamoTRIgine (LAMICTAL) 100 MG tablet Take 100 mg by mouth daily.   Yes Historical Provider, MD  Levothyroxine Sodium (TIROSINT) 112 MCG CAPS Take 1 tablet daily in am Patient taking differently: Take 1 capsule by  mouth every morning. Take 1 tablet daily in am 12/23/14  Yes Philemon Kingdom, MD  OVER THE COUNTER MEDICATION Take 1 capsule by mouth daily.   Yes Historical Provider, MD  oxyCODONE-acetaminophen (PERCOCET/ROXICET) 5-325 MG per tablet Take 1-2 tablets by mouth every 6 (six) hours as needed for severe pain. 10/24/14  Yes Gary Fleet, PA-C  pantoprazole (PROTONIX) 40 MG tablet Take 1 tablet (40 mg total) by mouth daily. 10/03/13  Yes Madilyn Hook, DO  traZODone (DESYREL) 50 MG tablet Take 150 mg by mouth at bedtime.  06/16/13  Yes Historical Provider, MD   BP 95/56 mmHg   Pulse 84  Temp(Src) 97.4 F (36.3 C) (Oral)  Resp 20  Ht 5\' 5"  (1.651 m)  Wt 143 lb (64.864 kg)  BMI 23.80 kg/m2  SpO2 100% Physical Exam  Constitutional: She is oriented to person, place, and time. She appears well-developed and well-nourished.  HENT:  Head: Normocephalic and atraumatic.  Eyes: Conjunctivae and EOM are normal. Pupils are equal, round, and reactive to light.  Neck: Normal range of motion. Neck supple.  Cardiovascular: Normal rate and regular rhythm.  Exam reveals no gallop and no friction rub.   No murmur heard. Pulmonary/Chest: Effort normal and breath sounds normal. No respiratory distress. She has no wheezes. She has no rales. She exhibits no tenderness.  Abdominal: Soft. Bowel sounds are normal. She exhibits no distension and no mass. There is tenderness. There is no rebound and no guarding.  Diffuse abdominal tenderness, worse on left  Musculoskeletal: Normal range of motion. She exhibits no edema or tenderness.  Neurological: She is alert and oriented to person, place, and time.  Skin: Skin is warm and dry.  Psychiatric: She has a normal mood and affect. Her behavior is normal. Judgment and thought content normal.  Nursing note and vitals reviewed.   ED Course  Procedures (including critical care time) Results for orders placed or performed during the hospital encounter of 09/23/15  CBC with Differential/Platelet  Result Value Ref Range   WBC 12.7 (H) 4.0 - 10.5 K/uL   RBC 4.05 3.87 - 5.11 MIL/uL   Hemoglobin 13.7 12.0 - 15.0 g/dL   HCT 40.6 36.0 - 46.0 %   MCV 100.2 (H) 78.0 - 100.0 fL   MCH 33.8 26.0 - 34.0 pg   MCHC 33.7 30.0 - 36.0 g/dL   RDW 11.7 11.5 - 15.5 %   Platelets 248 150 - 400 K/uL   Neutrophils Relative % 85 %   Neutro Abs 10.7 (H) 1.7 - 7.7 K/uL   Lymphocytes Relative 10 %   Lymphs Abs 1.3 0.7 - 4.0 K/uL   Monocytes Relative 5 %   Monocytes Absolute 0.7 0.1 - 1.0 K/uL   Eosinophils Relative 0 %   Eosinophils Absolute 0.1 0.0 - 0.7  K/uL   Basophils Relative 0 %   Basophils Absolute 0.0 0.0 - 0.1 K/uL  Comprehensive metabolic panel  Result Value Ref Range   Sodium 138 135 - 145 mmol/L   Potassium 4.3 3.5 - 5.1 mmol/L   Chloride 105 101 - 111 mmol/L   CO2 23 22 - 32 mmol/L   Glucose, Bld 121 (H) 65 - 99 mg/dL   BUN 12 6 - 20 mg/dL   Creatinine, Ser 0.62 0.44 - 1.00 mg/dL   Calcium 9.6 8.9 - 10.3 mg/dL   Total Protein 6.8 6.5 - 8.1 g/dL   Albumin 4.2 3.5 - 5.0 g/dL   AST 23 15 - 41 U/L  ALT 42 14 - 54 U/L   Alkaline Phosphatase 58 38 - 126 U/L   Total Bilirubin 0.8 0.3 - 1.2 mg/dL   GFR calc non Af Amer >60 >60 mL/min   GFR calc Af Amer >60 >60 mL/min   Anion gap 10 5 - 15  Lipase, blood  Result Value Ref Range   Lipase 20 11 - 51 U/L  I-Stat Troponin, ED (not at [facility])  Result Value Ref Range   Troponin i, poc 0.00 0.00 - 0.08 ng/mL   Comment 3          I-Stat Beta hCG blood, ED ([facility], [facility], [facility] only)  Result Value Ref Range   I-stat hCG, quantitative <5.0 <5 mIU/mL   Comment 3           Ct Abdomen Pelvis W Contrast  09/23/2015  CLINICAL DATA:  Diffuse abdominal pain radiating to LEFT shoulder. Gastric bypass Roux-en-Y surgery. Urine ablation. EXAM: CT ABDOMEN AND PELVIS WITH CONTRAST TECHNIQUE: Multidetector CT imaging of the abdomen and pelvis was performed using the standard protocol following bolus administration of intravenous contrast. CONTRAST:  160mL OMNIPAQUE IOHEXOL 300 MG/ML  SOLN COMPARISON:  CT 07/20/2010 FINDINGS: Lower chest: Lung bases are clear. Hepatobiliary: No focal hepatic lesion. Postcholecystectomy. No biliary dilatation. Pancreas: Pancreas is normal. No ductal dilatation. No pancreatic inflammation. Spleen: Normal spleen Adrenals/urinary tract: Adrenal glands and kidneys are normal. The ureters and bladder normal. Stomach/Bowel: Post bariatric surgery with gastric Roux-en-Y anatomy. No evidence of obstruction. There is not a small moderate volume intraperitoneal free air in the upper abdomen.  Some of a free air is adjacent to the the gastrojejunostomy (image 22, series 2). No evidence of leak of oral contrast. The more distal enteric enteric anastomosis (image 60, series 2) appears intact. There are several small gas pockets adjacent to the enteric enteric anastomosis. Appendix is not identified. Moderate volume stool in the RIGHT colon. LEFT colon rectosigmoid colon are normal. Vascular/Lymphatic: Abdominal aorta is normal caliber. There is no retroperitoneal or periportal lymphadenopathy. No pelvic lymphadenopathy. Reproductive: Uterus and ovaries are normal. Other: Small amount free fluid the posterior cul-de-sac (image 75, series 2) is relatively high density. Musculoskeletal: No aggressive osseous lesion. IMPRESSION: 1. Intraperitoneal free air without clear defined source. 2. Post gastric bypass surgery. Potential leaking site would be the gastrojejunostomy. Less likely a source would be the enteric enteric anastomosis. 3. Small amount of intraperitoneal free fluid in the pelvis is relatively high density which could indicate leakage of oral contrast. Hemoperitoneum is felt less likely. Findings conveyed toLittle, MD on 09/23/2015  at09:27. Electronically Signed   By: Suzy Bouchard M.D.   On: 09/23/2015 09:27   Dg Chest Port 1 View  09/23/2015  CLINICAL DATA:  Left upper abdominal pain, nausea and difficulty breathing. Progressive symptoms for 5 hours. EXAM: PORTABLE CHEST 1 VIEW COMPARISON:  09/25/2013 FINDINGS: Lung volumes are low. Mild elevation of left hemidiaphragm. Mild bibasilar atelectasis. No consolidation to suggest pneumonia. Cardiomediastinal contours are normal. No large pleural effusion or pneumothorax. No acute osseous abnormalities are seen. There is gaseous distention of bowel loops in the left upper quadrant of the abdomen. IMPRESSION: Low lung volumes with mild bibasilar atelectasis. Gaseous distention of bowel loops in the left upper abdomen. Electronically Signed   By:  Jeb Levering M.D.   On: 09/23/2015 06:48    I have personally reviewed and evaluated these images and lab results as part of my medical decision-making.   EKG Interpretation   Date/Time:  Wednesday September 23 2015 06:14:07 EST Ventricular Rate:  74 PR Interval:  123 QRS Duration: 87 QT Interval:  424 QTC Calculation: 470 R Axis:   71 Text Interpretation:  Sinus rhythm Nonspecific T abnormalities, lateral  leads No significant change since last tracing Confirmed by POLLINA  MD,  Biehle [phone]) on 09/23/2015 6:46:33 AM      MDM   Final diagnoses:  Intra-abdominal free air of unknown etiology    Patient with abdominal pain.  Will check labs.  Hx of Roux-En-Y and cholecystectomy and tubal ligation. Plan for CT abd.  Will also check troponin, EKG, and CXR given that pain radiates to left shoulder.  8:55 AM Discussed with Dr. Rex Kras, who recommends adding blood cultures and lactate.  CT pending.  Dr. Rex Kras spoke with radiology regarding CT findings.  Free air seen, without clear source, though thought to be at gastrojejunostomy vs distal anastomosis.  9:35  Patient seen by and discussed with Dr. Rex Kras.  Will consult surgery for evaluation of free air.  CRITICAL CARE Performed by: Montine Circle   Total critical care time: 45 minutes  Critical care time was exclusive of separately billable procedures and treating other patients.  Critical care was necessary to treat or prevent imminent or life-threatening deterioration.  Critical care was time spent personally by me on the following activities: development of treatment plan with patient and/or surrogate as well as nursing, discussions with consultants, evaluation of patient's response to treatment, examination of patient, obtaining history from patient or surrogate, ordering and performing treatments and interventions, ordering and review of laboratory studies, ordering and review of radiographic studies, pulse  oximetry and re-evaluation of patient's condition.   10:08 AM Discussed with Jinny Blossom, PA-C from general surgery, who will consult with Dr. Hassell Done.       Montine Circle, PA-C 09/23/15 1015  Orpah Greek, MD 09/24/15 [phone]

## 2015-09-23 NOTE — Transfer of Care (Signed)
Immediate Anesthesia Transfer of Care Note  Patient: Kimberly Stanley  Procedure(s) Performed: Procedure(s): LAPAROSCOPY DIAGNOSTIC placement of drain (N/A) UPPER GI ENDOSCOPY (N/A)  Patient Location: PACU  Anesthesia Type:General  Level of Consciousness: awake, alert  and oriented  Airway & Oxygen Therapy: Patient Spontanous Breathing and Patient connected to face mask oxygen  Post-op Assessment: Report given to RN and Post -op Vital signs reviewed and stable  Post vital signs: Reviewed and stable  Last Vitals:  Filed Vitals:   09/23/15 1253  BP: 102/63  Pulse: 72  Temp: 36.9 C  Resp: 14    Complications: No apparent anesthesia complications

## 2015-09-23 NOTE — ED Notes (Signed)
Pt is complaining of diffuse abd pain that radiates to her left shoulder. Nausea. Pain started all of a sudden at 1:00 this morning

## 2015-09-23 NOTE — ED Notes (Signed)
Care transferred to my colleague, Corinna Gab, RN.

## 2015-09-23 NOTE — Progress Notes (Addendum)
ANTIBIOTIC CONSULT NOTE - INITIAL  Pharmacy Consult for Zosyn Indication: intra-abdominal infection  No Known Allergies  Patient Measurements: Height: 5\' 5"  (165.1 cm) Weight: 143 lb (64.864 kg) IBW/kg (Calculated) : 57  Vital Signs: Temp: 98.7 F (37.1 C) (11/16 0810) Temp Source: Oral (11/16 0810) BP: 98/57 mmHg (11/16 0810) Pulse Rate: 72 (11/16 0810) Intake/Output from previous day:   Intake/Output from this shift:    Labs:  Recent Labs  09/23/15 0642  WBC 12.7*  HGB 13.7  PLT 248  CREATININE 0.62   Estimated Creatinine Clearance: 79.1 mL/min (by C-G formula based on Cr of 0.62). No results for input(s): VANCOTROUGH, VANCOPEAK, VANCORANDOM, GENTTROUGH, GENTPEAK, GENTRANDOM, TOBRATROUGH, TOBRAPEAK, TOBRARND, AMIKACINPEAK, AMIKACINTROU, AMIKACIN in the last 72 hours.   Microbiology: No results found for this or any previous visit (from the past 720 hour(s)).  Medical History: Past Medical History  Diagnosis Date  . Thyroid disease   . Hypertension   . Hypothyroidism   . Depression   . Eating disorder     loose alot of weight since bypass  . Wears contact lenses     Medications:  Scheduled:     Assessment: 49 yo F who presents with abdominal pain/nausea/vomiting.  She is s/p gastric bypass and hernia repair (Nov 2014).   CT abdomen shows intraperitoneal free air without known source.  Pharmacy asked to initiate empiric Zosyn.   Surgery consult pending.  Patient is afebrile with mild leukocytosis.   Renal function is at patient's baseline.   11/16 >>Zosyn  >>  11/16 blood: IP  Dose changes/levels:   Goal of Therapy:  Eradicate infection.  Plan:  Zosyn 3.375gm IV Q8h to be infused over 4hrs Monitor renal function and cx data   Biagio Borg 09/23/2015,10:23 AM

## 2015-09-24 DIAGNOSIS — F509 Eating disorder, unspecified: Secondary | ICD-10-CM | POA: Diagnosis present

## 2015-09-24 DIAGNOSIS — K631 Perforation of intestine (nontraumatic): Secondary | ICD-10-CM | POA: Diagnosis present

## 2015-09-24 DIAGNOSIS — K567 Ileus, unspecified: Secondary | ICD-10-CM | POA: Diagnosis not present

## 2015-09-24 DIAGNOSIS — Z800 Family history of malignant neoplasm of digestive organs: Secondary | ICD-10-CM | POA: Diagnosis not present

## 2015-09-24 DIAGNOSIS — Z79891 Long term (current) use of opiate analgesic: Secondary | ICD-10-CM | POA: Diagnosis not present

## 2015-09-24 DIAGNOSIS — I10 Essential (primary) hypertension: Secondary | ICD-10-CM | POA: Diagnosis present

## 2015-09-24 DIAGNOSIS — E039 Hypothyroidism, unspecified: Secondary | ICD-10-CM | POA: Diagnosis present

## 2015-09-24 DIAGNOSIS — Z8249 Family history of ischemic heart disease and other diseases of the circulatory system: Secondary | ICD-10-CM | POA: Diagnosis not present

## 2015-09-24 DIAGNOSIS — D72829 Elevated white blood cell count, unspecified: Secondary | ICD-10-CM | POA: Diagnosis present

## 2015-09-24 DIAGNOSIS — F329 Major depressive disorder, single episode, unspecified: Secondary | ICD-10-CM | POA: Diagnosis present

## 2015-09-24 DIAGNOSIS — Z79899 Other long term (current) drug therapy: Secondary | ICD-10-CM | POA: Diagnosis not present

## 2015-09-24 DIAGNOSIS — Z87891 Personal history of nicotine dependence: Secondary | ICD-10-CM | POA: Diagnosis not present

## 2015-09-24 DIAGNOSIS — Z809 Family history of malignant neoplasm, unspecified: Secondary | ICD-10-CM | POA: Diagnosis not present

## 2015-09-24 DIAGNOSIS — R1013 Epigastric pain: Secondary | ICD-10-CM | POA: Diagnosis present

## 2015-09-24 DIAGNOSIS — Z803 Family history of malignant neoplasm of breast: Secondary | ICD-10-CM | POA: Diagnosis not present

## 2015-09-24 DIAGNOSIS — Z8659 Personal history of other mental and behavioral disorders: Secondary | ICD-10-CM | POA: Diagnosis not present

## 2015-09-24 DIAGNOSIS — E785 Hyperlipidemia, unspecified: Secondary | ICD-10-CM | POA: Diagnosis present

## 2015-09-24 LAB — BASIC METABOLIC PANEL
Anion gap: 5 (ref 5–15)
BUN: 5 mg/dL — ABNORMAL LOW (ref 6–20)
CO2: 27 mmol/L (ref 22–32)
Calcium: 8.4 mg/dL — ABNORMAL LOW (ref 8.9–10.3)
Chloride: 105 mmol/L (ref 101–111)
Creatinine, Ser: 0.53 mg/dL (ref 0.44–1.00)
GFR calc Af Amer: >60 mL/min (ref >60)
GFR calc non Af Amer: >60 mL/min (ref >60)
Glucose, Bld: 98 mg/dL (ref 65–99)
Potassium: 3.9 mmol/L (ref 3.5–5.1)
Sodium: 137 mmol/L (ref 135–145)

## 2015-09-24 LAB — CBC
HCT: 31.5 % — ABNORMAL LOW (ref 36.0–46.0)
Hemoglobin: 10.5 g/dL — ABNORMAL LOW (ref 12.0–15.0)
MCH: 33.9 pg (ref 26.0–34.0)
MCHC: 33.3 g/dL (ref 30.0–36.0)
MCV: 101.6 fL — ABNORMAL HIGH (ref 78.0–100.0)
Platelets: 184 10*3/uL (ref 150–400)
RBC: 3.1 MIL/uL — ABNORMAL LOW (ref 3.87–5.11)
RDW: 11.9 % (ref 11.5–15.5)
WBC: 10.1 10*3/uL (ref 4.0–10.5)

## 2015-09-24 MED ORDER — LEVOTHYROXINE SODIUM 112 MCG PO TABS
112.0000 ug | ORAL_TABLET | Freq: Every day | ORAL | Status: DC
  Administered 2015-09-24 – 2015-09-28 (×4): 112 ug via ORAL
  Filled 2015-09-24 (×6): qty 1

## 2015-09-24 MED ORDER — ACETAMINOPHEN 325 MG PO TABS
325.0000 mg | ORAL_TABLET | Freq: Four times a day (QID) | ORAL | Status: DC | PRN
  Administered 2015-09-25 (×3): 650 mg via ORAL
  Filled 2015-09-24 (×3): qty 2

## 2015-09-24 MED ORDER — METHOCARBAMOL 1000 MG/10ML IJ SOLN
1000.0000 mg | Freq: Three times a day (TID) | INTRAMUSCULAR | Status: DC | PRN
  Filled 2015-09-24: qty 10

## 2015-09-24 NOTE — Anesthesia Postprocedure Evaluation (Signed)
  Anesthesia Post-op Note  Patient: Kimberly Stanley  Procedure(s) Performed: Procedure(s) (LRB): LAPAROSCOPY DIAGNOSTIC placement of drain (N/A) UPPER GI ENDOSCOPY (N/A)  Patient Location: PACU  Anesthesia Type: General  Level of Consciousness: awake and alert   Airway and Oxygen Therapy: Patient Spontanous Breathing  Post-op Pain: mild  Post-op Assessment: Post-op Vital signs reviewed, Patient's Cardiovascular Status Stable, Respiratory Function Stable, Patent Airway and No signs of Nausea or vomiting  Last Vitals:  Filed Vitals:   09/24/15 0931  BP: 110/56  Pulse: 72  Temp: 37 C  Resp: 18    Post-op Vital Signs: stable   Complications: No apparent anesthesia complications

## 2015-09-24 NOTE — Op Note (Signed)
NAMEANGELESE, PIETILA                ACCOUNT NO.:  [id number]  MEDICAL RECORD NO.:  [id number]  LOCATION:  [id number]                         FACILITY:  [facility]  PHYSICIAN:  Isabel Caprice. Hassell Done, MD  DATE OF BIRTH:  January 03, 1966  DATE OF PROCEDURE: DATE OF DISCHARGE:                              OPERATIVE REPORT   PREOPERATIVE DIAGNOSIS:  Perforated viscus in a patient that Dr. Lilyan Punt had previously performed a Roux-en-Y gastric bypass surgery.  POSTOPERATIVE DIAGNOSIS:  Apparent perforation at the gastrojejunostomy that has self sealed.  PROCEDURE:  Laparoscopy with inspection of the gastrojejunostomy and the jejunojejunostomy along with upper endoscopy and placement of a 19 Blake drain.  SURGEON:  Isabel Caprice. Hassell Done, M.D.  ASSISTANT:  None.  ANESTHESIA:  General endotracheal.  DESCRIPTION OF PROCEDURE:  The patient was taken to room 4 from the emergency room and given general anesthesia.  The abdomen was prepped with PCMX and draped sterilely.  Access to the abdomen was achieved through the left upper quadrant with a 5 mm OptiView without difficulty. Following insufflation, I placed a series of 5 mm.  Survey of the abdomen revealed mainly in the right underneath the liver and under the gastrojejunostomy.  There was a little bit of contrast I could see with soapy bubbleness along the Roux-en-Y and down in the area of the [place].  I examined the gastrojejunostomy and then marched down to the [place] and inspected it carefully.  There was no fibrinous exudate or pus. Proximally at the [place], there was purulence and some exudate around the liver and above.  I went ahead and performed endoscopy and had the efferent limb clamped.  With insufflation, I did not see any bubbles.  I did see what appears to be a marginal ulcer there at the gastrojejunostomy.  There was also a silk suture that I saw, but I did not have an endoscopic forceps to remove this.  There was no problem with the esophagus that I could  see and I was able to run the scope down ways of the Roux-en-Y limb and all looked healthy.  Seeing no bubbles, I went back and reinspected the area and elected to place a 19 Blake drain in the region.  I washed her out with several L of saline, placed a drain.  I injected the ports with Exparel and brought the drain out to the right side and secured it with a 4-0 nylon.  The other ports were closed with 4-0 Vicryl and with LiquiBand.  The patient was taken to the recovery room in satisfactory condition.    Isabel Caprice Hassell Done, MD    MBM/MEDQ  D:  09/23/2015  T:  09/24/2015  Job:  [id number]

## 2015-09-24 NOTE — Progress Notes (Signed)
[facility] Progress Note  1 Day Post-Op  Subjective: Pain still an issue this am.  Referred pain to left shoulder.  Not much flatus and no BM yet.  Not ambulated much OOB.  IS to 1100.  Friend at bedside.    Objective: Vital signs in last 24 hours: Temp:  [98.2 F (36.8 C)-98.9 F (37.2 C)] 98.6 F (37 C) (11/17 0931) Pulse Rate:  [67-80] 72 (11/17 0931) Resp:  [11-18] 18 (11/17 0931) BP: (89-110)/(46-70) 110/56 mmHg (11/17 0931) SpO2:  [95 %-100 %] 98 % (11/17 0931) Last BM Date: 09/22/15  Intake/Output from previous day: 11/16 0701 - 11/17 0700 In: 4677.5 [I.V.:4677.5] Out: 1750 [Urine:1660; Drains:80; Blood:10] Intake/Output this shift: Total I/O In: -  Out: 500 [Urine:450; Drains:50]  PE: Gen:  Alert, NAD, pleasant Abd: Soft, tender over incisions sites, ND, +BS, no HSM, incisions C/D/I, drain with minimal serosanguinous drainage   Lab Results:   Recent Labs  09/23/15 0642  WBC 12.7*  HGB 13.7  HCT 40.6  PLT 248   BMET  Recent Labs  09/23/15 0642  NA 138  K 4.3  CL 105  CO2 23  GLUCOSE 121*  BUN 12  CREATININE 0.62  CALCIUM 9.6   PT/INR No results for input(s): LABPROT, INR in the last 72 hours. CMP     Component Value Date/Time   NA 138 09/23/2015 0642   K 4.3 09/23/2015 0642   CL 105 09/23/2015 0642   CO2 23 09/23/2015 0642   GLUCOSE 121* 09/23/2015 0642   BUN 12 09/23/2015 0642   CREATININE 0.62 09/23/2015 0642   CREATININE 0.73 05/16/2014 0943   CALCIUM 9.6 09/23/2015 0642   PROT 6.8 09/23/2015 0642   ALBUMIN 4.2 09/23/2015 0642   AST 23 09/23/2015 0642   ALT 42 09/23/2015 0642   ALKPHOS 58 09/23/2015 0642   BILITOT 0.8 09/23/2015 0642   GFRNONAA >60 09/23/2015 0642   GFRAA >60 09/23/2015 0642   Lipase     Component Value Date/Time   LIPASE 20 09/23/2015 0642       Studies/Results: Ct Abdomen Pelvis W Contrast  09/23/2015  CLINICAL DATA:  Diffuse abdominal pain radiating to LEFT shoulder. Gastric bypass  Roux-en-Y surgery. Urine ablation. EXAM: CT ABDOMEN AND PELVIS WITH CONTRAST TECHNIQUE: Multidetector CT imaging of the abdomen and pelvis was performed using the standard protocol following bolus administration of intravenous contrast. CONTRAST:  116mL OMNIPAQUE IOHEXOL 300 MG/ML  SOLN COMPARISON:  CT 07/20/2010 FINDINGS: Lower chest: Lung bases are clear. Hepatobiliary: No focal hepatic lesion. Postcholecystectomy. No biliary dilatation. Pancreas: Pancreas is normal. No ductal dilatation. No pancreatic inflammation. Spleen: Normal spleen Adrenals/urinary tract: Adrenal glands and kidneys are normal. The ureters and bladder normal. Stomach/Bowel: Post bariatric surgery with gastric Roux-en-Y anatomy. No evidence of obstruction. There is not a small moderate volume intraperitoneal free air in the upper abdomen. Some of a free air is adjacent to the the gastrojejunostomy (image 22, series 2). No evidence of leak of oral contrast. The more distal enteric enteric anastomosis (image 60, series 2) appears intact. There are several small gas pockets adjacent to the enteric enteric anastomosis. Appendix is not identified. Moderate volume stool in the RIGHT colon. LEFT colon rectosigmoid colon are normal. Vascular/Lymphatic: Abdominal aorta is normal caliber. There is no retroperitoneal or periportal lymphadenopathy. No pelvic lymphadenopathy. Reproductive: Uterus and ovaries are normal. Other: Small amount free fluid the posterior cul-de-sac (image 75, series 2) is relatively high density. Musculoskeletal: No aggressive osseous lesion. IMPRESSION: 1.  Intraperitoneal free air without clear defined source. 2. Post gastric bypass surgery. Potential leaking site would be the gastrojejunostomy. Less likely a source would be the enteric enteric anastomosis. 3. Small amount of intraperitoneal free fluid in the pelvis is relatively high density which could indicate leakage of oral contrast. Hemoperitoneum is felt less likely.  Findings conveyed toLittle, MD on 09/23/2015  at09:27. Electronically Signed   By: Suzy Bouchard M.D.   On: 09/23/2015 09:27   Dg Chest Port 1 View  09/23/2015  CLINICAL DATA:  Left upper abdominal pain, nausea and difficulty breathing. Progressive symptoms for 5 hours. EXAM: PORTABLE CHEST 1 VIEW COMPARISON:  09/25/2013 FINDINGS: Lung volumes are low. Mild elevation of left hemidiaphragm. Mild bibasilar atelectasis. No consolidation to suggest pneumonia. Cardiomediastinal contours are normal. No large pleural effusion or pneumothorax. No acute osseous abnormalities are seen. There is gaseous distention of bowel loops in the left upper quadrant of the abdomen. IMPRESSION: Low lung volumes with mild bibasilar atelectasis. Gaseous distention of bowel loops in the left upper abdomen. Electronically Signed   By: Jeb Levering M.D.   On: 09/23/2015 06:48    Anti-infectives: Anti-infectives    Start     Dose/Rate Route Frequency Ordered Stop   09/23/15 2100  cefTRIAXone (ROCEPHIN) 2 g in dextrose 5 % 50 mL IVPB    Comments:  Pharmacy may adjust dosing strength / duration / interval for maximal efficacy   2 g 100 mL/hr over 30 Minutes Intravenous Every 24 hours 09/23/15 2000     09/23/15 2100  metroNIDAZOLE (FLAGYL) IVPB 500 mg     500 mg 100 mL/hr over 60 Minutes Intravenous Every 6 hours 09/23/15 2000     09/23/15 1630  Ampicillin-Sulbactam (UNASYN) 3 g in sodium chloride 0.9 % 100 mL IVPB  Status:  Discontinued     3 g 100 mL/hr over 60 Minutes Intravenous 4 times per day 09/23/15 1624 09/23/15 2000   09/23/15 1600  [MAR Hold]  piperacillin-tazobactam (ZOSYN) IVPB 3.375 g  Status:  Discontinued     (MAR Hold since 09/23/15 1410)   3.375 g 12.5 mL/hr over 240 Minutes Intravenous Every 8 hours 09/23/15 1041 09/23/15 1645   09/23/15 1030  piperacillin-tazobactam (ZOSYN) IVPB 3.375 g     3.375 g 100 mL/hr over 30 Minutes Intravenous  Once 09/23/15 1017 09/23/15 1131        Assessment/Plan Perforated gastrojejunostomy/self sealed POD #1 s/p laparoscopic inspection with placement of blake drain Leukocytosis - 12.7 -No major repairs were required during surgery.  Monitor drain output. -May need UGI xray to check for leak at some point.  Will discuss with Dr. 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

## 2015-09-25 LAB — BASIC METABOLIC PANEL
Anion gap: 6 (ref 5–15)
BUN: 7 mg/dL (ref 6–20)
CO2: 28 mmol/L (ref 22–32)
Calcium: 8.5 mg/dL — ABNORMAL LOW (ref 8.9–10.3)
Chloride: 105 mmol/L (ref 101–111)
Creatinine, Ser: 0.57 mg/dL (ref 0.44–1.00)
GFR calc Af Amer: >60 mL/min (ref >60)
GFR calc non Af Amer: >60 mL/min (ref >60)
Glucose, Bld: 94 mg/dL (ref 65–99)
Potassium: 3.9 mmol/L (ref 3.5–5.1)
Sodium: 139 mmol/L (ref 135–145)

## 2015-09-25 LAB — CBC
HCT: 30.4 % — ABNORMAL LOW (ref 36.0–46.0)
Hemoglobin: 10 g/dL — ABNORMAL LOW (ref 12.0–15.0)
MCH: 32.9 pg (ref 26.0–34.0)
MCHC: 32.9 g/dL (ref 30.0–36.0)
MCV: 100 fL (ref 78.0–100.0)
Platelets: 166 10*3/uL (ref 150–400)
RBC: 3.04 MIL/uL — ABNORMAL LOW (ref 3.87–5.11)
RDW: 11.6 % (ref 11.5–15.5)
WBC: 7 10*3/uL (ref 4.0–10.5)

## 2015-09-25 MED ORDER — BUSPIRONE HCL 15 MG PO TABS
30.0000 mg | ORAL_TABLET | Freq: Two times a day (BID) | ORAL | Status: DC
  Administered 2015-09-25 – 2015-09-28 (×6): 30 mg via ORAL
  Filled 2015-09-25 (×8): qty 2

## 2015-09-25 MED ORDER — FLUOXETINE HCL 20 MG PO TABS
60.0000 mg | ORAL_TABLET | Freq: Every day | ORAL | Status: DC
  Administered 2015-09-25 – 2015-09-28 (×3): 60 mg via ORAL
  Filled 2015-09-25 (×4): qty 3

## 2015-09-25 NOTE — Progress Notes (Signed)
Telephone order from [facility] to discontinue foley, order entered Neta Mends RN 3:25 PM 09-25-2015

## 2015-09-25 NOTE — Progress Notes (Signed)
[facility] Progress Note  2 Days Post-Op  Subjective: Wants to know what the plan is.  Really wants to eat.  Nauseated this am.  Tolerating ice, but she's drinking water.  C/o headache being the worst.  Less abdominal pain.  Drain with serosanguinous drainage.  Ambulating some OOB.  Husband at bedside.    Objective: Vital signs in last 24 hours: Temp:  [98 F (36.7 C)-98.4 F (36.9 C)] 98.4 F (36.9 C) (11/18 0545) Pulse Rate:  [63-67] 63 (11/18 0545) Resp:  [16] 16 (11/18 0545) BP: (101-112)/(63-64) 112/64 mmHg (11/18 0545) SpO2:  [96 %-97 %] 97 % (11/18 0545) Last BM Date: 09/22/15  Intake/Output from previous day: 11/17 0701 - 11/18 0700 In: 1197.5 [I.V.:1197.5] Out: 1470 [Urine:1400; Drains:70] Intake/Output this shift: Total I/O In: 30 [P.O.:30] Out: 46 [Urine:50; Drains:10]  PE: Gen:  Alert, NAD, pleasant Abd: Soft, ND, mild tenderness, +BS, no HSM, incisions C/D/I with dermabond in place, drain with minimal serosanguinous drainage, well healed previous laparoscopic scars   Lab Results:   Recent Labs  09/24/15 1145 09/25/15 0425  WBC 10.1 7.0  HGB 10.5* 10.0*  HCT 31.5* 30.4*  PLT 184 166   BMET  Recent Labs  09/24/15 1145 09/25/15 0425  NA 137 139  K 3.9 3.9  CL 105 105  CO2 27 28  GLUCOSE 98 94  BUN 5* 7  CREATININE 0.53 0.57  CALCIUM 8.4* 8.5*   PT/INR No results for input(s): LABPROT, INR in the last 72 hours. CMP     Component Value Date/Time   NA 139 09/25/2015 0425   K 3.9 09/25/2015 0425   CL 105 09/25/2015 0425   CO2 28 09/25/2015 0425   GLUCOSE 94 09/25/2015 0425   BUN 7 09/25/2015 0425   CREATININE 0.57 09/25/2015 0425   CREATININE 0.73 05/16/2014 0943   CALCIUM 8.5* 09/25/2015 0425   PROT 6.8 09/23/2015 0642   ALBUMIN 4.2 09/23/2015 0642   AST 23 09/23/2015 0642   ALT 42 09/23/2015 0642   ALKPHOS 58 09/23/2015 0642   BILITOT 0.8 09/23/2015 0642   GFRNONAA >60 09/25/2015 0425   GFRAA >60 09/25/2015 0425    Lipase     Component Value Date/Time   LIPASE 20 09/23/2015 0642       Studies/Results: No results found.  Anti-infectives: Anti-infectives    Start     Dose/Rate Route Frequency Ordered Stop   09/23/15 2100  cefTRIAXone (ROCEPHIN) 2 g in dextrose 5 % 50 mL IVPB    Comments:  Pharmacy may adjust dosing strength / duration / interval for maximal efficacy   2 g 100 mL/hr over 30 Minutes Intravenous Every 24 hours 09/23/15 2000     09/23/15 2100  metroNIDAZOLE (FLAGYL) IVPB 500 mg     500 mg 100 mL/hr over 60 Minutes Intravenous Every 6 hours 09/23/15 2000     09/23/15 1630  Ampicillin-Sulbactam (UNASYN) 3 g in sodium chloride 0.9 % 100 mL IVPB  Status:  Discontinued     3 g 100 mL/hr over 60 Minutes Intravenous 4 times per day 09/23/15 1624 09/23/15 2000   09/23/15 1600  [MAR Hold]  piperacillin-tazobactam (ZOSYN) IVPB 3.375 g  Status:  Discontinued     (MAR Hold since 09/23/15 1410)   3.375 g 12.5 mL/hr over 240 Minutes Intravenous Every 8 hours 09/23/15 1041 09/23/15 1645   09/23/15 1030  piperacillin-tazobactam (ZOSYN) IVPB 3.375 g     3.375 g 100 mL/hr over 30 Minutes Intravenous  Once  09/23/15 1017 09/23/15 1131       Assessment/Plan Perforated gastrojejunostomy/self sealed POD #2 s/p laparoscopic inspection with placement of blake drain -No major repairs were required during surgery. Monitor drain output. 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

## 2015-09-26 MED ORDER — ACETAMINOPHEN 325 MG PO TABS: 325.0000 mg | ORAL_TABLET | Freq: Four times a day (QID) | ORAL | Status: DC | PRN

## 2015-09-26 MED ORDER — SUCRALFATE 1 GM/10ML PO SUSP
1.0000 g | Freq: Three times a day (TID) | ORAL | Status: DC
  Administered 2015-09-26 (×2): 1 g via ORAL
  Filled 2015-09-26 (×8): qty 10

## 2015-09-26 MED ORDER — ACETAMINOPHEN 10 MG/ML IV SOLN
1000.0000 mg | Freq: Four times a day (QID) | INTRAVENOUS | Status: AC
  Administered 2015-09-26 – 2015-09-27 (×4): 1000 mg via INTRAVENOUS
  Filled 2015-09-26 (×4): qty 100

## 2015-09-26 MED ORDER — PROMETHAZINE HCL 25 MG/ML IJ SOLN
12.5000 mg | Freq: Four times a day (QID) | INTRAMUSCULAR | Status: DC | PRN
  Administered 2015-09-26: 12.5 mg via INTRAVENOUS
  Filled 2015-09-26: qty 1

## 2015-09-26 NOTE — Progress Notes (Signed)
3 Days Post-Op  Subjective: Some nausea. Some shoulder discomfort. C/o headache and nausea. Drank water and some apple juice yesterday - did not worsen pain or worsen nausea  Objective: Vital signs in last 24 hours: Temp:  [98.4 F (36.9 C)-98.7 F (37.1 C)] 98.4 F (36.9 C) (11/19 0538) Pulse Rate:  [47-55] 47 (11/19 0538) Resp:  [15-18] 15 (11/19 0538) BP: (105-129)/(68-74) 129/74 mmHg (11/19 0538) SpO2:  [97 %-98 %] 98 % (11/19 0538) Last BM Date: 09/22/15  Intake/Output from previous day: 11/18 0701 - 11/19 0700 In: 656.7 [P.O.:90; I.V.:416.7; IV Piggyback:150] Out: 1150 [Urine:1025; Drains:125] Intake/Output this shift:    Alert, nontoxic cta Reg Soft, nt, nd. Incisions c/d/i; drain serous  Lab Results:   Recent Labs  09/24/15 1145 09/25/15 0425  WBC 10.1 7.0  HGB 10.5* 10.0*  HCT 31.5* 30.4*  PLT 184 166   BMET  Recent Labs  09/24/15 1145 09/25/15 0425  NA 137 139  K 3.9 3.9  CL 105 105  CO2 27 28  GLUCOSE 98 94  BUN 5* 7  CREATININE 0.53 0.57  CALCIUM 8.4* 8.5*   PT/INR No results for input(s): LABPROT, INR in the last 72 hours. ABG No results for input(s): PHART, HCO3 in the last 72 hours.  Invalid input(s): PCO2, PO2  Studies/Results: No results found.  Anti-infectives: Anti-infectives    Start     Dose/Rate Route Frequency Ordered Stop   09/23/15 2100  cefTRIAXone (ROCEPHIN) 2 g in dextrose 5 % 50 mL IVPB    Comments:  Pharmacy may adjust dosing strength / duration / interval for maximal efficacy   2 g 100 mL/hr over 30 Minutes Intravenous Every 24 hours 09/23/15 2000     09/23/15 2100  metroNIDAZOLE (FLAGYL) IVPB 500 mg     500 mg 100 mL/hr over 60 Minutes Intravenous Every 6 hours 09/23/15 2000     09/23/15 1630  Ampicillin-Sulbactam (UNASYN) 3 g in sodium chloride 0.9 % 100 mL IVPB  Status:  Discontinued     3 g 100 mL/hr over 60 Minutes Intravenous 4 times per day 09/23/15 1624 09/23/15 2000   09/23/15 1600  [MAR Hold]   piperacillin-tazobactam (ZOSYN) IVPB 3.375 g  Status:  Discontinued     (MAR Hold since 09/23/15 1410)   3.375 g 12.5 mL/hr over 240 Minutes Intravenous Every 8 hours 09/23/15 1041 09/23/15 1645   09/23/15 1030  piperacillin-tazobactam (ZOSYN) IVPB 3.375 g     3.375 g 100 mL/hr over 30 Minutes Intravenous  Once 09/23/15 1017 09/23/15 1131      Assessment/Plan: s/p Procedure(s): LAPAROSCOPY DIAGNOSTIC placement of drain (N/A) UPPER GI ENDOSCOPY (N/A) Perforated gastrojejunostomy/self sealed POD #3 s/p laparoscopic inspection with placement of blake drain Although she has nausea and some shoulder discomfort, she has no fever or tachycardia. Looks ok. Drain is serous.  Will start bariatric clears and monitor response Add phenergan Cont bid protonix IV tylenol for HA Cont chemical vte prophylaxis Repeat labs in am  Leighton Ruff. Redmond Pulling, MD, FACS General, Bariatric, & Minimally Invasive Surgery [facility], [place]   LOS: 2 days    Gayland Curry 09/26/2015

## 2015-09-27 LAB — BASIC METABOLIC PANEL
Anion gap: 5 (ref 5–15)
BUN: <5 mg/dL — ABNORMAL LOW (ref 6–20)
CO2: 27 mmol/L (ref 22–32)
Calcium: 8.4 mg/dL — ABNORMAL LOW (ref 8.9–10.3)
Chloride: 107 mmol/L (ref 101–111)
Creatinine, Ser: 0.64 mg/dL (ref 0.44–1.00)
GFR calc Af Amer: >60 mL/min (ref >60)
GFR calc non Af Amer: >60 mL/min (ref >60)
Glucose, Bld: 127 mg/dL — ABNORMAL HIGH (ref 65–99)
Potassium: 4 mmol/L (ref 3.5–5.1)
Sodium: 139 mmol/L (ref 135–145)

## 2015-09-27 LAB — CBC
HCT: 33.9 % — ABNORMAL LOW (ref 36.0–46.0)
Hemoglobin: 11.7 g/dL — ABNORMAL LOW (ref 12.0–15.0)
MCH: 34 pg (ref 26.0–34.0)
MCHC: 34.5 g/dL (ref 30.0–36.0)
MCV: 98.5 fL (ref 78.0–100.0)
Platelets: 220 10*3/uL (ref 150–400)
RBC: 3.44 MIL/uL — ABNORMAL LOW (ref 3.87–5.11)
RDW: 11.6 % (ref 11.5–15.5)
WBC: 5.3 10*3/uL (ref 4.0–10.5)

## 2015-09-27 LAB — MAGNESIUM: Magnesium: 1.7 mg/dL (ref 1.7–2.4)

## 2015-09-27 MED ORDER — PREMIER PROTEIN SHAKE
6.0000 [oz_av] | Freq: Four times a day (QID) | ORAL | Status: DC
  Administered 2015-09-27 – 2015-09-28 (×6): 8 [oz_av] via ORAL
  Filled 2015-09-27: qty 325.31

## 2015-09-27 MED ORDER — TRAZODONE HCL 50 MG PO TABS
150.0000 mg | ORAL_TABLET | Freq: Every day | ORAL | Status: DC
  Filled 2015-09-27 (×2): qty 3

## 2015-09-27 NOTE — Progress Notes (Signed)
4 Days Post-Op  Subjective: Lots of nausea yesterday. Some discomfort in upper abd but nothing like what made her come to ED. Didn't ambulate much due to nausea. Had some clears - didn't make pain worse. carafate tastes nasty and not sure if that is contributing to nausea. Did not get any protein shakes  Objective: Vital signs in last 24 hours: Temp:  [97.4 F (36.3 C)-98.4 F (36.9 C)] 97.4 F (36.3 C) (11/20 0649) Pulse Rate:  [51-65] 59 (11/20 0649) Resp:  [14-16] 16 (11/20 0649) BP: (114-124)/(68-74) 115/74 mmHg (11/20 0649) SpO2:  [98 %-100 %] 98 % (11/20 0649) Last BM Date: 09/22/15  Intake/Output from previous day: 11/19 0701 - 11/20 0700 In: 2100 [P.O.:600; I.V.:1200; IV Piggyback:300] Out: 2120 [Urine:2050; Drains:70] Intake/Output this shift:    Alert, looks about same as yesterday cta  Reg Soft, nd, incisions c/d/i; MILD epigastric TTP; drain - is empty, looked like had serous fluid  Lab Results:   Recent Labs  09/25/15 0425 09/27/15 0608  WBC 7.0 5.3  HGB 10.0* 11.7*  HCT 30.4* 33.9*  PLT 166 220   BMET  Recent Labs  09/25/15 0425 09/27/15 0608  NA 139 139  K 3.9 4.0  CL 105 107  CO2 28 27  GLUCOSE 94 127*  BUN 7 <5*  CREATININE 0.57 0.64  CALCIUM 8.5* 8.4*   PT/INR No results for input(s): LABPROT, INR in the last 72 hours. ABG No results for input(s): PHART, HCO3 in the last 72 hours.  Invalid input(s): PCO2, PO2  Studies/Results: No results found.  Anti-infectives: Anti-infectives    Start     Dose/Rate Route Frequency Ordered Stop   09/23/15 2100  cefTRIAXone (ROCEPHIN) 2 g in dextrose 5 % 50 mL IVPB    Comments:  Pharmacy may adjust dosing strength / duration / interval for maximal efficacy   2 g 100 mL/hr over 30 Minutes Intravenous Every 24 hours 09/23/15 2000     09/23/15 2100  metroNIDAZOLE (FLAGYL) IVPB 500 mg     500 mg 100 mL/hr over 60 Minutes Intravenous Every 6 hours 09/23/15 2000     09/23/15 1630   Ampicillin-Sulbactam (UNASYN) 3 g in sodium chloride 0.9 % 100 mL IVPB  Status:  Discontinued     3 g 100 mL/hr over 60 Minutes Intravenous 4 times per day 09/23/15 1624 09/23/15 2000   09/23/15 1600  [MAR Hold]  piperacillin-tazobactam (ZOSYN) IVPB 3.375 g  Status:  Discontinued     (MAR Hold since 09/23/15 1410)   3.375 g 12.5 mL/hr over 240 Minutes Intravenous Every 8 hours 09/23/15 1041 09/23/15 1645   09/23/15 1030  piperacillin-tazobactam (ZOSYN) IVPB 3.375 g     3.375 g 100 mL/hr over 30 Minutes Intravenous  Once 09/23/15 1017 09/23/15 1131      Assessment/Plan: s/p Procedure(s): LAPAROSCOPY DIAGNOSTIC placement of drain (N/A) UPPER GI ENDOSCOPY (N/A) Perforated gastrojejunostomy/self sealed POD #3 s/p laparoscopic inspection with placement of blake drain  Cont antiemetics Will stop carafate and see if this helps nausea Cont bid PPI Adv to bariatric fulls Will speak with nurse about getting protein shakes ordered. rec to pt to drink protein shakes first before trying full liquids Cont IV abx  No sign has ongoing leak - no fever, no wbc, exam benign, drain serous  Leighton Ruff. Redmond Pulling, MD, FACS General, Bariatric, & Minimally Invasive Surgery [facility], [place]   LOS: 3 days    Gayland Curry 09/27/2015

## 2015-09-28 LAB — CULTURE, BLOOD (ROUTINE X 2)
Culture: NO GROWTH
Culture: NO GROWTH

## 2015-09-28 MED ORDER — METHOCARBAMOL 500 MG PO TABS
1000.0000 mg | ORAL_TABLET | Freq: Three times a day (TID) | ORAL | Status: DC | PRN
Start: 2015-09-28 — End: 2015-09-28

## 2015-09-28 MED ORDER — MORPHINE SULFATE (PF) 2 MG/ML IV SOLN: 2.0000 mg | INTRAVENOUS | Status: DC | PRN

## 2015-09-28 MED ORDER — SODIUM CHLORIDE 0.9 % IJ SOLN: 3.0000 mL | INTRAMUSCULAR | Status: DC | PRN

## 2015-09-28 MED ORDER — SUCRALFATE 1 GM/10ML PO SUSP
1.0000 g | Freq: Three times a day (TID) | ORAL | Status: DC
  Administered 2015-09-28: 1 g via ORAL
  Filled 2015-09-28 (×4): qty 10

## 2015-09-28 MED ORDER — SODIUM CHLORIDE 0.9 % IV SOLN: 250.0000 mL | INTRAVENOUS | Status: DC | PRN

## 2015-09-28 MED ORDER — OXYCODONE-ACETAMINOPHEN 5-325 MG PO TABS: 1.0000 | ORAL_TABLET | Freq: Four times a day (QID) | ORAL | Status: DC | PRN

## 2015-09-28 MED ORDER — PANTOPRAZOLE SODIUM 40 MG PO TBEC
40.0000 mg | DELAYED_RELEASE_TABLET | Freq: Two times a day (BID) | ORAL | Status: DC
  Administered 2015-09-28: 40 mg via ORAL
  Filled 2015-09-28: qty 1

## 2015-09-28 MED ORDER — MORPHINE SULFATE (PF) 2 MG/ML IV SOLN
2.0000 mg | INTRAVENOUS | Status: DC | PRN
  Administered 2015-09-28: 2 mg via INTRAVENOUS
  Administered 2015-09-28: 4 mg via INTRAVENOUS
  Filled 2015-09-28: qty 2
  Filled 2015-09-28: qty 1

## 2015-09-28 MED ORDER — PANTOPRAZOLE SODIUM 40 MG PO TBEC: 40.0000 mg | DELAYED_RELEASE_TABLET | Freq: Two times a day (BID) | ORAL | Status: DC

## 2015-09-28 MED ORDER — OXYCODONE HCL 5 MG PO TABS
5.0000 mg | ORAL_TABLET | ORAL | Status: DC | PRN
  Administered 2015-09-28 (×2): 5 mg via ORAL
  Filled 2015-09-28 (×2): qty 1

## 2015-09-28 MED ORDER — SODIUM CHLORIDE 0.9 % IJ SOLN: 3.0000 mL | Freq: Two times a day (BID) | INTRAMUSCULAR | Status: DC

## 2015-09-28 NOTE — Care Management Note (Signed)
Case Management Note  Patient Details  Name: Kimberly Stanley MRN: [id number] Date of Birth: [birth year]  Subjective/Objective:      Perforated gastrojejunostomy/self sealed POD #5 s/p laparoscopic inspection with placement of blake drain              Action/Plan: Discharge planning, no needs if drain d/ced prior to d/c.   Expected Discharge Date:   (UNKNOWN)               Expected Discharge Plan:  Home/Self Care  In-House Referral:  NA  Discharge planning Services  CM Consult, NA  Post Acute Care Choice:  NA Choice offered to:  NA  DME Arranged:  N/A DME Agency:  NA  HH Arranged:  NA HH Agency:  NA  Status of Service:  Completed, signed off  Medicare Important Message Given:    Date Medicare IM Given:    Medicare IM give by:    Date Additional Medicare IM Given:    Additional Medicare Important Message give by:     If discussed at [place] of Stay Meetings, dates discussed:    Additional Comments:  Guadalupe Maple, RN 09/28/2015, 10:13 AM

## 2015-09-28 NOTE — Progress Notes (Signed)
[facility] Progress Note  5 Days Post-Op  Subjective: Pt doing well, having no N/V.  Having good BM's.  Pain well controlled.  Ambulating well OOB.  Urinating well.  Wants solid food.  Objective: Vital signs in last 24 hours: Temp:  [97.6 F (36.4 C)-97.7 F (36.5 C)] 97.6 F (36.4 C) (11/21 0444) Pulse Rate:  [51-58] 58 (11/21 0444) Resp:  [16] 16 (11/21 0444) BP: (114-124)/(71-80) 124/71 mmHg (11/21 0444) SpO2:  [98 %-100 %] 100 % (11/21 0444) Last BM Date: 09/26/15  Intake/Output from previous day: 11/20 0701 - 11/21 0700 In: 1960 [P.O.:660; I.V.:1100; IV Piggyback:200] Out: 3500 [Urine:3350; Drains:150] Intake/Output this shift: Total I/O In: -  Out: 700 [Urine:700]  PE: Gen:  Alert, NAD, pleasant Abd: Soft, NT/ND, +BS, no HSM, incisions C/D/I, drain with minimal serous drainage 130mL/24hr yellow clear   Lab Results:   Recent Labs  09/27/15 0608  WBC 5.3  HGB 11.7*  HCT 33.9*  PLT 220   BMET  Recent Labs  09/27/15 0608  NA 139  K 4.0  CL 107  CO2 27  GLUCOSE 127*  BUN <5*  CREATININE 0.64  CALCIUM 8.4*   PT/INR No results for input(s): LABPROT, INR in the last 72 hours. CMP     Component Value Date/Time   NA 139 09/27/2015 0608   K 4.0 09/27/2015 0608   CL 107 09/27/2015 0608   CO2 27 09/27/2015 0608   GLUCOSE 127* 09/27/2015 0608   BUN <5* 09/27/2015 0608   CREATININE 0.64 09/27/2015 0608   CREATININE 0.73 05/16/2014 0943   CALCIUM 8.4* 09/27/2015 0608   PROT 6.8 09/23/2015 0642   ALBUMIN 4.2 09/23/2015 0642   AST 23 09/23/2015 0642   ALT 42 09/23/2015 0642   ALKPHOS 58 09/23/2015 0642   BILITOT 0.8 09/23/2015 0642   GFRNONAA >60 09/27/2015 0608   GFRAA >60 09/27/2015 0608   Lipase     Component Value Date/Time   LIPASE 20 09/23/2015 0642       Studies/Results: No results found.  Anti-infectives: Anti-infectives    Start     Dose/Rate Route Frequency Ordered Stop   09/23/15 2100  cefTRIAXone (ROCEPHIN) 2  g in dextrose 5 % 50 mL IVPB    Comments:  Pharmacy may adjust dosing strength / duration / interval for maximal efficacy   2 g 100 mL/hr over 30 Minutes Intravenous Every 24 hours 09/23/15 2000     09/23/15 2100  metroNIDAZOLE (FLAGYL) IVPB 500 mg     500 mg 100 mL/hr over 60 Minutes Intravenous Every 6 hours 09/23/15 2000     09/23/15 1630  Ampicillin-Sulbactam (UNASYN) 3 g in sodium chloride 0.9 % 100 mL IVPB  Status:  Discontinued     3 g 100 mL/hr over 60 Minutes Intravenous 4 times per day 09/23/15 1624 09/23/15 2000   09/23/15 1600  [MAR Hold]  piperacillin-tazobactam (ZOSYN) IVPB 3.375 g  Status:  Discontinued     (MAR Hold since 09/23/15 1410)   3.375 g 12.5 mL/hr over 240 Minutes Intravenous Every 8 hours 09/23/15 1041 09/23/15 1645   09/23/15 1030  piperacillin-tazobactam (ZOSYN) IVPB 3.375 g     3.375 g 100 mL/hr over 30 Minutes Intravenous  Once 09/23/15 1017 09/23/15 1131       Assessment/Plan Perforated gastrojejunostomy/self sealed POD #5 s/p laparoscopic inspection with placement of blake drain -No major repairs were required during surgery. Monitor drain output. 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

## 2015-09-28 NOTE — Progress Notes (Signed)
09/28/15  1645  Reviewed discharge instructions with patient. Patient verbalized understanding of discharge instructions. Copy of discharge instructions, work note, and prescription given to patient.

## 2015-09-28 NOTE — Progress Notes (Signed)
Assumed care of patient at this time. Patient is stable with no complaints at this time. Agree with previously documented assessment. Will continue to monitor patient.   [name], [name] 09/28/2015  

## 2015-09-28 NOTE — Discharge Summary (Signed)
[facility] Discharge Summary   Patient ID: Kimberly Stanley MRN: [id number] DOB/AGE: November 26, 1965 49 y.o.  Admit date: 09/23/2015 Discharge date: 09/28/2015  Admitting Diagnosis: Intra-abdominal free air of unknown etiology H/o Lap Roux y gastric bypass Leukocytosis  Discharge Diagnosis Patient Active Problem List   Diagnosis Date Noted  . Intra-abdominal free air of unknown etiology 09/23/2015  . 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 Preseptal cellulitis 12/20/2011  . Leukocytosis 12/20/2011  . HTN (hypertension) 12/20/2011  . Anxiety and depression 12/20/2011  . Hypothyroidism 12/20/2011  . Abscess of forehead 12/20/2011    Consultants None  Imaging: No results found.  Procedures Dr. Hassell Done (09/23/15) - Laparoscopy with inspection of the gastrojejunostomy and the jejunojejunostomy along with upper endoscopy and placement of a 19 Blake drain.  Hospital Course:  49 y/o white female with PMH HTN, HLD, depression s/p Roux en Y by Dr. Lilyan Punt in 2014, but is now followed by Dr. Hassell Done in our office. She recently saw Dr. Hassell Done due to abdominal pain which has been going on for the past month. She was started on Protonix and Carafate. She says she hasn't seen any improvement and has gradually worsened. Last night she had sudden onset of severe 10/10 crampy abdominal pain at 1am. She came to the [facility] for further evaluation. The epigastric pain radiates to her left shoulder and across her entire abdomen. C/o nausea and dry heaving, but not able to vomit. Admits to chills, SOB. No fevers, diarrhea, constipation, CP, dysuria. Last BM was yesterday and last meal last night was salad. Denies any precipitating/alleviating factors. She says she's cut back on acidic foods because she thought it may be due to her lemon intake. No sodas or energy  drinks. She does not smoke or drink alcohol. No excessive NSAIDS. Drinks 2 large cups of coffee/day.   Workup showed Intra-abdominal free air of unknown etiology.  With her history of gastric bypass she was taken urgently to the OR.  Patient was admitted and underwent procedure listed above.  She was found to have an apparent perforation at the gastrojejunostomy that has self sealed.  Blake drain was placed.  Tolerated procedure well and was transferred to the floor.  She experienced a post-operative ileus over the next few days.  No NG tube was needed.  Slowly her pain and ileus improved.  She had no sign of ongoing leak - no fever, no wbc, exam benign, drain serous.  Diet was advanced as tolerated.  JP drain was monitored closely and output with died advancement did not increase.  She completed 5 days of IV antibiotics, these will be discontinued upon discharge.  She has been heavily and frequently counseled on continuing to take her Protonix 40mg  BID until Dr. Hassell Done tells her otherwise.  She was not able to tolerate the Carafate so that will not be continued at discharge.  On POD #5, the patient was voiding well, tolerating diet, ambulating well, pain well controlled, vital signs stable, incisions c/d/i with dermabond in place and felt stable for discharge home.  Blake drain was discontinued prior to discharge.  Patient will follow up in our office in 2-3 weeks and knows to call with questions or concerns.  She will call to confirm appointment date/time.        Medication List    TAKE these medications  acetaminophen 500 MG tablet  Commonly known as:  TYLENOL  Take 1,000 mg by mouth every 6 (six) hours as needed for mild pain.     busPIRone 15 MG tablet  Commonly known as:  BUSPAR  Take 30 mg by mouth 2 (two) times daily.     FLUoxetine 20 MG tablet  Commonly known as:  PROZAC  Take 60 mg by mouth daily.     lamoTRIgine 100 MG tablet  Commonly known as:  LAMICTAL  Take 100 mg  by mouth daily.     Levothyroxine Sodium 112 MCG Caps  Commonly known as:  TIROSINT  Take 1 tablet daily in am     OVER THE COUNTER MEDICATION  Take 1 capsule by mouth daily.     oxyCODONE-acetaminophen 5-325 MG tablet  Commonly known as:  PERCOCET/ROXICET  Take 1-2 tablets by mouth every 6 (six) hours as needed for severe pain.     pantoprazole 40 MG tablet  Commonly known as:  PROTONIX  Take 1 tablet (40 mg total) by mouth 2 (two) times daily.     traZODone 50 MG tablet  Commonly known as:  DESYREL  Take 150 mg by mouth at bedtime.         Follow-up Information    Schedule an appointment as soon as possible for a visit with Pedro Earls, MD.   Specialty:  General Surgery   Why:  For post-operation check.  Call to confirm the appointment date and time with Dr. Hassell Done within 2-3 weeks.   Contact information:   [place] [facility] [address] [phone]       Signed: Nat Christen, [facility] [phone]  09/28/2015, 2:54 PM

## 2015-09-28 NOTE — Discharge Instructions (Signed)
Food Choices for Peptic Ulcer Disease When you have peptic ulcer disease, the foods you eat and your eating habits are very important. Choosing the right foods can help ease the discomfort of peptic ulcer disease. WHAT GENERAL GUIDELINES DO I NEED TO FOLLOW?  Choose fruits, vegetables, whole grains, and low-fat meat, fish, and poultry.   Keep a food diary to identify foods that cause symptoms.  Avoid foods that cause irritation or pain. These may be different for different people.  Eat frequent small meals instead of three large meals each day. The pain may be worse when your stomach is empty.  Avoid eating close to bedtime. WHAT FOODS ARE NOT RECOMMENDED? The following are some foods and drinks that may worsen your symptoms:  Black, white, and red pepper.  Hot sauce.  Chili peppers.  Chili powder.  Chocolate and cocoa.   Alcohol.  Tea, coffee, and cola (regular and decaffeinated). The items listed above may not be a complete list of foods and beverages to avoid. Contact your dietitian for more information.   This information is not intended to replace advice given to you by your health care provider. Make sure you discuss any questions you have with your health care provider.   Document Released: 01/16/2012 Document Revised: 10/29/2013 Document Reviewed: 08/28/2013 Elsevier Interactive Patient Education 2016 [employer] Inc.  ------------------------------------------------------------------------------  LAPAROSCOPIC SURGERY: POST OP INSTRUCTIONS  1. DIET: Follow a light bland diet the first 24 hours after arrival home, such as soup, liquids, crackers, etc. Be sure to include lots of fluids daily. Avoid fast food or heavy meals as your are more likely to get nauseated. Eat a low fat the next few days after surgery.  2. Take your usually prescribed home medications unless otherwise directed. 3. PAIN CONTROL:  1. Pain is best controlled by a usual combination of three  different methods TOGETHER:  1. Ice/Heat 2. Over the counter pain medication 3. Prescription pain medication 2. Most patients will experience some swelling and bruising around the incisions. Ice packs or heating pads (30-60 minutes up to 6 times a day) will help. Use ice for the first few days to help decrease swelling and bruising, then switch to heat to help relax tight/sore spots and speed recovery. Some people prefer to use ice alone, heat alone, alternating between ice & heat. Experiment to what works for you. Swelling and bruising can take several weeks to resolve.  3. It is helpful to take an over-the-counter pain medication regularly for the first few weeks. Choose one of the following that works best for you:  1. Naproxen (Aleve, etc) Two 220mg  tabs twice a day 2. Ibuprofen (Advil, etc) Three 200mg  tabs four times a day (every meal & bedtime) 3. Acetaminophen (Tylenol, etc) 500-650mg  four times a day (every meal & bedtime) 4. A prescription for pain medication (such as oxycodone, hydrocodone, etc) should be given to you upon discharge. Take your pain medication as prescribed.  1. If you are having problems/concerns with the prescription medicine (does not control pain, nausea, vomiting, rash, itching, etc), please call us [phone] to see if we need to switch you to a different pain medicine that will work better for you and/or control your side effect better. 2. If you need a refill on your pain medication, please contact your pharmacy. They will contact our office to request authorization. Prescriptions will not be filled after 5 pm or on week-ends. 4. Avoid getting constipated. Between the surgery and the pain medications, it is  common to experience some constipation. Increasing fluid intake and taking a fiber supplement (such as Metamucil, Citrucel, FiberCon, MiraLax, etc) 1-2 times a day regularly will usually help prevent this problem from occurring. A mild laxative (prune juice, Milk  of Magnesia, MiraLax, etc) should be taken according to package directions if there are no bowel movements after 48 hours.  5. Watch out for diarrhea. If you have many loose bowel movements, simplify your diet to bland foods & liquids for a few days. Stop any stool softeners and decrease your fiber supplement. Switching to mild anti-diarrheal medications (Kayopectate, Pepto Bismol) can help. If this worsens or does not improve, please call us. 6. Wash / shower every day. You may shower over the dressings as they are waterproof. Continue to shower over incision(s) after the dressing is off. 7. Remove your waterproof bandages 5 days after surgery. You may leave the incision open to air. You may replace a dressing/Band-Aid to cover the incision for comfort if you wish.  8. ACTIVITIES as tolerated:  1. You may resume regular (light) daily activities beginning the next day--such as daily self-care, walking, climbing stairs--gradually increasing activities as tolerated. If you can walk 30 minutes without difficulty, it is safe to try more intense activity such as jogging, treadmill, bicycling, low-impact aerobics, swimming, etc. 2. Save the most intensive and strenuous activity for last such as sit-ups, heavy lifting, contact sports, etc Refrain from any heavy lifting or straining until you are off narcotics for pain control.  3. DO NOT PUSH THROUGH PAIN. Let pain be your guide: If it hurts to do something, don't do it. Pain is your body warning you to avoid that activity for another week until the pain goes down. 4. You may drive when you are no longer taking prescription pain medication, you can comfortably wear a seatbelt, and you can safely maneuver your car and apply brakes. 5. You may have sexual intercourse when it is comfortable.  9. FOLLOW UP in our office  1. Please call [facility] at [phone] to set up an appointment to see your surgeon in the office for a follow-up appointment approximately 2-3 weeks  after your surgery. 2. Make sure that you call for this appointment the day you arrive home to insure a convenient appointment time.      10. IF YOU HAVE DISABILITY OR FAMILY LEAVE FORMS, BRING THEM TO THE               OFFICE FOR PROCESSING.   WHEN TO CALL us [phone]:  1. Poor pain control 2. Reactions / problems with new medications (rash/itching, nausea, etc)  3. Fever over 101.5 F (38.5 C) 4. Inability to urinate 5. Nausea and/or vomiting 6. Worsening swelling or bruising 7. Continued bleeding from incision. 8. Increased pain, redness, or drainage from the incision  The clinic staff is available to answer your questions during regular business hours (8:30am-5pm). Please dont hesitate to call and ask to speak to one of our nurses for clinical concerns.  If you have a medical emergency, go to the nearest emergency room or call 911.  A surgeon from [facility] is always on call at the [facility], [place], [place], [place], [place] [address] ?  MAIN: [phone] ? TOLL FREE: [phone] ?  FAX [phone]  [email or web address]

## 2015-10-08 ENCOUNTER — Other Ambulatory Visit: Payer: Self-pay

## 2015-10-08 DIAGNOSIS — Z1231 Encounter for screening mammogram for malignant neoplasm of breast: Secondary | ICD-10-CM

## 2015-10-13 ENCOUNTER — Encounter: Payer: 59 | Attending: Surgery | Admitting: *Deleted

## 2015-10-13 DIAGNOSIS — F509 Eating disorder, unspecified: Secondary | ICD-10-CM

## 2015-10-13 DIAGNOSIS — Z6820 Body mass index (BMI) 20.0-20.9, adult: Secondary | ICD-10-CM | POA: Insufficient documentation

## 2015-10-13 DIAGNOSIS — Z713 Dietary counseling and surveillance: Secondary | ICD-10-CM | POA: Diagnosis not present

## 2015-10-13 DIAGNOSIS — Z9884 Bariatric surgery status: Secondary | ICD-10-CM | POA: Insufficient documentation

## 2015-10-13 DIAGNOSIS — E669 Obesity, unspecified: Secondary | ICD-10-CM | POA: Insufficient documentation

## 2015-10-13 NOTE — Patient Instructions (Addendum)
TBW is up 5.5 lb % body fat is down 2.8% Body fat is down 3.5 lb  To help with uncomfortably fullness feeling, eat more mindfully: eat at table without distractions.  Eat more slowly (aim for 20 minutes)  If you are interested in personal training, [place] [phone]

## 2015-10-13 NOTE — Progress Notes (Signed)
Appointment start time: 1700 Appointment end time: 1800  Patient was seen on 10/13/15 for nutrition counseling pertaining to disordered eating  Primary care provider: Harlan Stanley Therapist: Esperanza Stanley  Any other medical team members: Dr. Toy Care  Spouse: Kimberly Stanley  Assessment: Kimberly Stanley was discharged from [facility] s/p bowel resection 3 weeks ago.  She experienced a perforated bowel that was repaired.  Since that time she has been very anxious about her health and is very fearful of a recurrent episode.  She would like to know more about how to eat to protect herself from future perforations.  However, this experience has given her some fresh perspective and she is trying not to be bothered by "the little things" as much anymore.  Her eating and body image are still a struggle.  She feels she has gained excessive weight and that she is eating excessively.  She is very dissatisfied with her body and would very much like to lose ~20 pounds.  She is grieving over her clothes that no longer fit and is buying clothes that are much too large for her body.   She has been to see Dr. Toy Care who made no changes to her medications.  Dr. Toy Care will no longer be accepting Kimberly Stanley's insurance.  Kimberly Stanley is considering staying with Dr. Toy Care, however, and paying out of pocket.  Cut coffee is half, drinks more water Is eating "nothing good".  Still eating peanut butter crackers and then having her shake for breakfast Has been getting uncomfortably full   Medications:  See list.  No changes   TANITA  BODY COMP RESULTS  12/17/14 02/12/15 02/25/15 03/11/15 03/18/15 05/25/15 07/28/15 09/08/15 10/13/15   BMI (kg/m^2) 19 20.3 20.2 20.9 21.1 22.2 22.9 23.9 24.5   Fat Mass (lbs) 9 22.5 21.5 23.5 25 26  35 32 28.5   Fat Free Mass (lbs) 105 99.5 100 102 102 107.5 102.5 111 118.5   Total Body Water (lbs) 77 73 73 74.5 74.5 78.5 75 81.5 87  Body fat % 8 18.3 17.8 18.8 19.6 19.6 25.6 22.2 19.4     Mental health diagnosis: AN, restricting  type  Dietary assessment: A typical day consists of 60meals and 3 snacks  Shake Nabs catered lunch Snacks Take out snacks   Physical activity: not much.   Would like a personal trainer    Compensatory behaviors:  None currently             Estimated energy intake:  1800 kcal  Estimated energy needs for weight maitenance: 1800 kcal 225 g CHO 90 g pro 60 g fat  Nutrition Diagnosis: NB-1.2 Harmful beliefs/attitudes about food or nutrition-related topics  As related to proper balance of fats, carbohydrates, and proteins.  As evidenced by eating disorder.  Intervention/Goals:   Encouraged patient to reject traditional diet mentality of "good" vs "bad" foods.  She will continue to binge if she continues to restrict.  Discussed weight increase is from water only. Her fat has decreased.  The body dissatisfaction she's experiencing is a cognitive distortion.  Her eating disorder voice is LOUD right now.  Discussed low fiber diet for her perforated bowel. Reminded mindful eating: at the table without distractions. Honor internal cues, but also find coping strategies besides food when feeling upset (need therapy).  Emphasized food neutrality.  There are not "good foods" or "bad foods".  Food is fuel.  We need it to survive.  Kimberly Stanley she will always struggle with diet mentality and disordered thoughts until  she learns to neutralize foods in her mind.  Please challenge those eating disorder thoughts.  Need positive messaging constantly Gave recommendation for weight neutral personal trainer, [name]  Monitoring and Evaluation: Patient will follow up in 1 month.

## 2015-10-27 ENCOUNTER — Other Ambulatory Visit: Payer: Self-pay | Admitting: Family Medicine

## 2015-10-27 ENCOUNTER — Other Ambulatory Visit (HOSPITAL_COMMUNITY)
Admission: RE | Admit: 2015-10-27 | Discharge: 2015-10-27 | Disposition: A | Discharge status: Home / Self Care | Payer: 59 | Source: Ambulatory Visit | Attending: Family Medicine | Admitting: Family Medicine

## 2015-10-27 DIAGNOSIS — Z124 Encounter for screening for malignant neoplasm of cervix: Secondary | ICD-10-CM | POA: Insufficient documentation

## 2015-10-28 LAB — CYTOLOGY - PAP

## 2015-10-30 ENCOUNTER — Ambulatory Visit
Admission: RE | Admit: 2015-10-30 | Discharge: 2015-10-30 | Disposition: A | Discharge status: Home / Self Care | Payer: 59 | Source: Ambulatory Visit

## 2015-10-30 DIAGNOSIS — Z1231 Encounter for screening mammogram for malignant neoplasm of breast: Secondary | ICD-10-CM

## 2015-11-10 ENCOUNTER — Encounter: Payer: 59 | Attending: Surgery | Admitting: *Deleted

## 2015-11-10 VITALS — Ht 65.0 in | Wt 141.0 lb

## 2015-11-10 DIAGNOSIS — Z9884 Bariatric surgery status: Secondary | ICD-10-CM | POA: Insufficient documentation

## 2015-11-10 DIAGNOSIS — Z6820 Body mass index (BMI) 20.0-20.9, adult: Secondary | ICD-10-CM | POA: Insufficient documentation

## 2015-11-10 DIAGNOSIS — Z713 Dietary counseling and surveillance: Secondary | ICD-10-CM | POA: Diagnosis not present

## 2015-11-10 DIAGNOSIS — F509 Eating disorder, unspecified: Secondary | ICD-10-CM

## 2015-11-10 DIAGNOSIS — E669 Obesity, unspecified: Secondary | ICD-10-CM | POA: Diagnosis not present

## 2015-11-10 NOTE — Progress Notes (Signed)
Appointment start time: 1700 Appointment end time: 1800  Patient was seen on 11/09/14 for nutrition counseling pertaining to disordered eating  Primary care provider: Harlan Stains Therapist: Esperanza Heir  Any other medical team members: Dr. Toy Care  Spouse: Orene Desanctis  Assessment:  Is trying to not snack quite as often and she is coloring at night to keep herself distracted. States she is trying to eat more nutritious, more with balance.  However, dietary recall reveals restriction Is struggling with wanting to cut out carbs Didn't eat much this past weekend and Ed's voice is loud Is trying to log food again and stay diligent, per [name].  Is dissatisfied with body and wants to lose weight   TANITA  BODY COMP RESULTS  12/17/14 02/25/15 03/18/15 05/25/15 07/28/15 09/08/15 10/13/15 11/10/15   BMI (kg/m^2) 19 20.2 21.1 22.2 22.9 23.9 24.5 23.5   Fat Mass (lbs) 9 21.5 25 26  35 32 28.5 36   Fat Free Mass (lbs) 105 100 102 107.5 102.5 111 118.5 105   Total Body Water (lbs) 77 73 74.5 78.5 75 81.5 87 77  Body fat % 8 17.8 19.6 19.6 25.6 22.2 19.4 25.7     Mental health diagnosis: AN, restricting type  Dietary assessment: 24 hour recall B: shake L: snicker bar D: smothered sirloin with mashed potatoes and spinach salad  Today so far B: shake L: quest bar            Estimated energy intake:  1000 kcal  Estimated energy needs for weight maitenance: 1800 kcal 225 g CHO 90 g pro 60 g fat  Nutrition Diagnosis: NB-1.2 Harmful beliefs/attitudes about food or nutrition-related topics  As related to proper balance of fats, carbohydrates, and proteins.  As evidenced by eating disorder.  Intervention/Goals:  Nutrition counseling provided.  Challenged cognitive distortions.  Reiterated need for adequate nutrition, no matter what a person weighs.   Monitoring and Evaluation: Patient will follow up in 1 month.

## 2015-11-14 ENCOUNTER — Encounter: Payer: Self-pay | Admitting: *Deleted

## 2015-12-15 ENCOUNTER — Ambulatory Visit: Payer: 59 | Admitting: *Deleted

## 2016-08-23 ENCOUNTER — Encounter: Payer: Self-pay | Admitting: *Deleted

## 2016-10-15 ENCOUNTER — Encounter: Payer: Self-pay | Admitting: *Deleted

## 2016-11-13 DIAGNOSIS — L02411 Cutaneous abscess of right axilla: Secondary | ICD-10-CM | POA: Diagnosis not present

## 2016-12-03 IMAGING — CT CT ABD-PELV W/ CM
2 of 5 series · 16 of 46 positions shown, 18 images · IV contrast (OMNIPAQUE 300)
Comparison: CT 07/20/2010

CLINICAL DATA: Diffuse abdominal pain radiating to LEFT shoulder.
Gastric bypass Roux-en-Y surgery. Urine ablation.

EXAM:
CT ABDOMEN AND PELVIS WITH CONTRAST
TECHNIQUE: Multidetector CT imaging of the abdomen and pelvis was performed
using the standard protocol following bolus administration of
intravenous contrast.
CONTRAST:  100mL OMNIPAQUE IOHEXOL 300 MG/ML  SOLN

[Series 2: abd/pel with · axial · 0.72mm/px · z∈[+1042,+1472]mm · 13 of 96 slices shown, 15 images]
[im 5/96  soft-tissue]
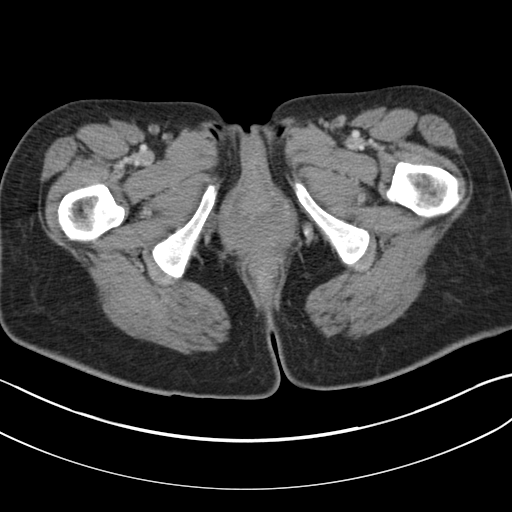
[im 5/96  bone]
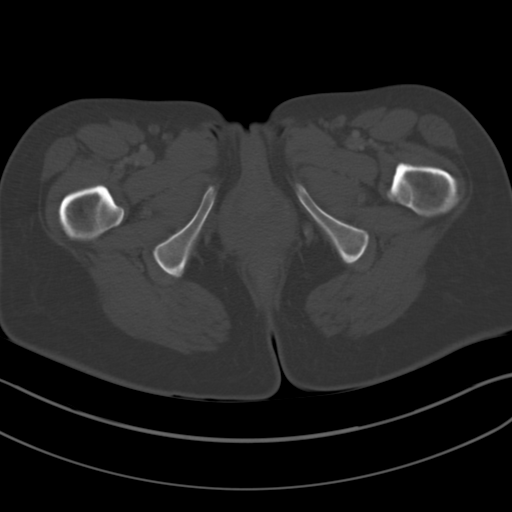
[im 15/96  soft-tissue]
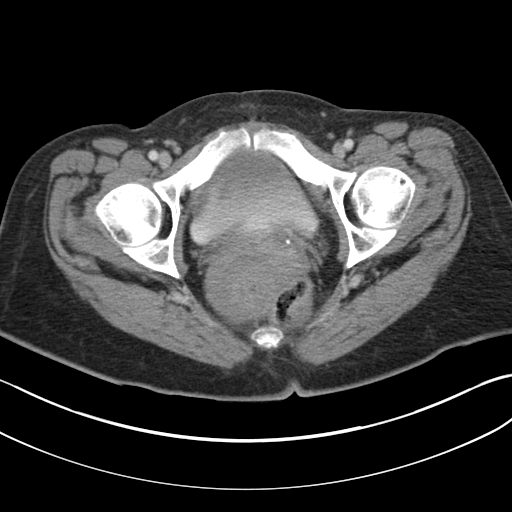
[im 20/96  soft-tissue]
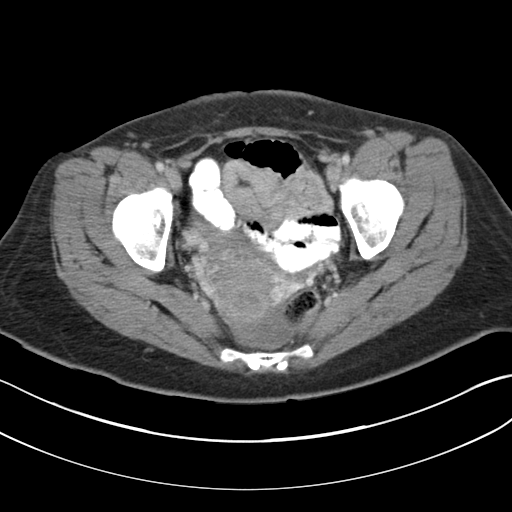
[im 29/96  soft-tissue]
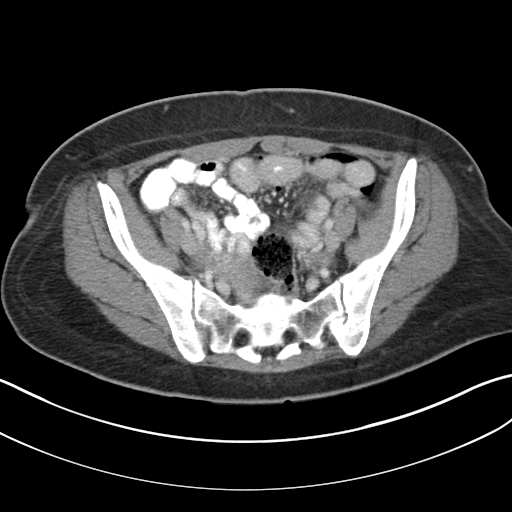
[im 34/96  soft-tissue]
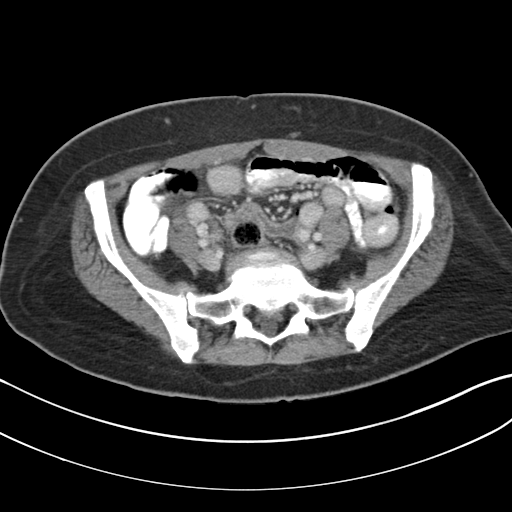
[im 43/96  soft-tissue]
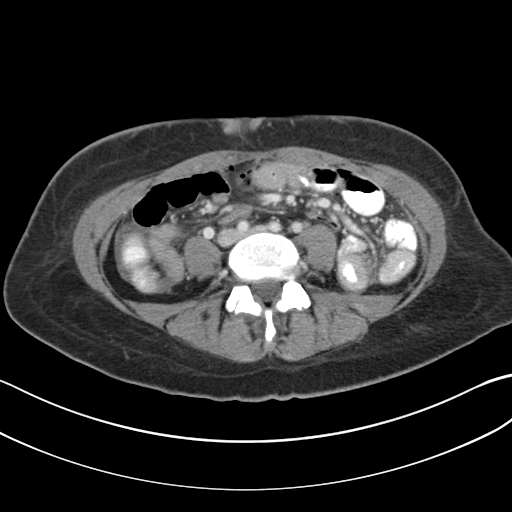
[im 48/96  soft-tissue]
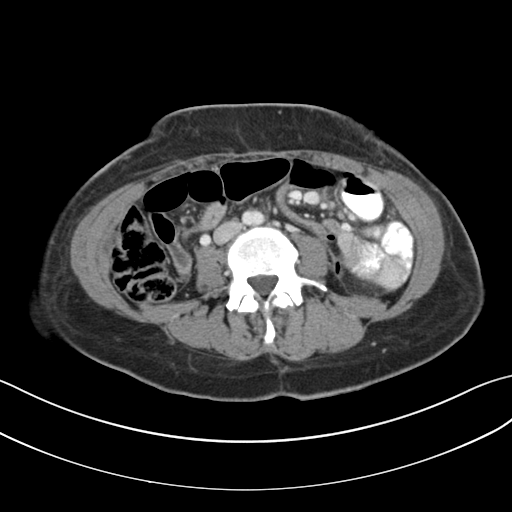
[im 53/96  soft-tissue]
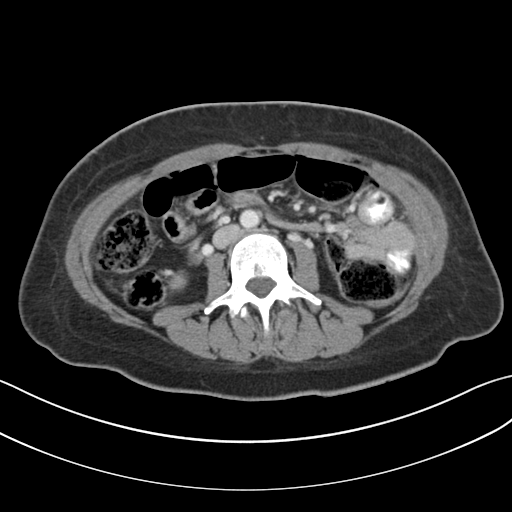
[im 62/96  soft-tissue]
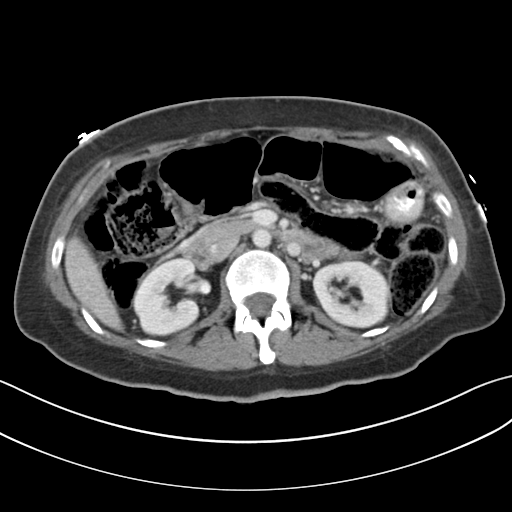
[im 62/96  bone]
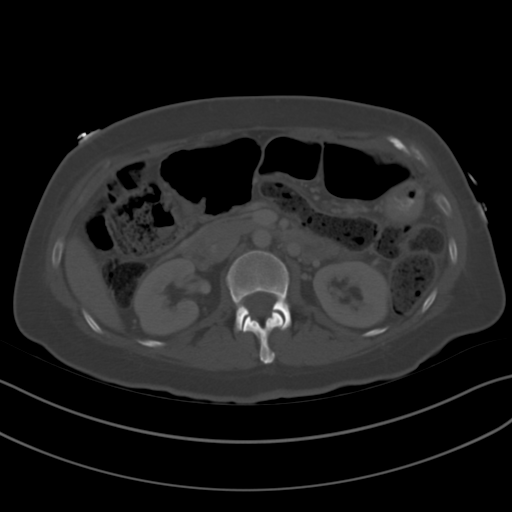
[im 67/96  soft-tissue]
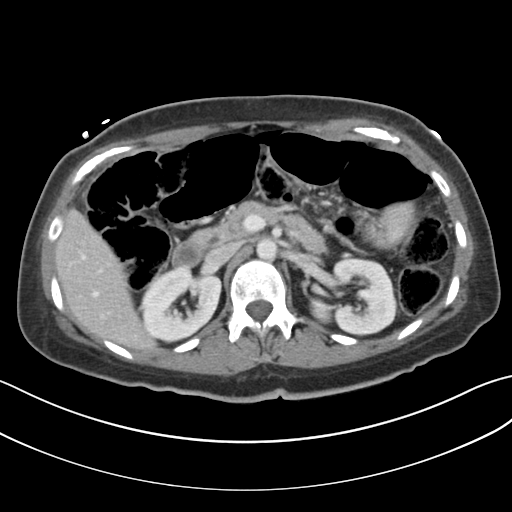
[im 77/96  soft-tissue]
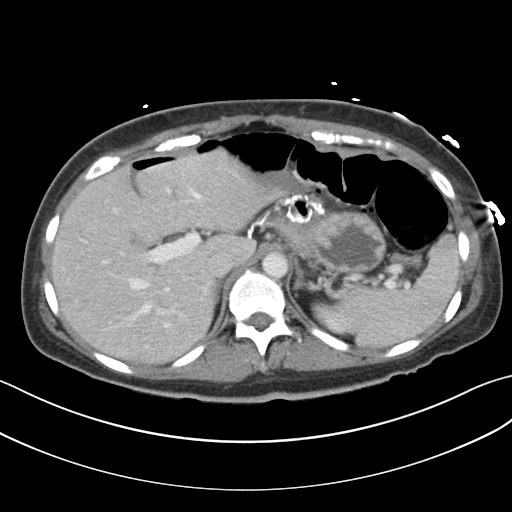
[im 81/96  soft-tissue]
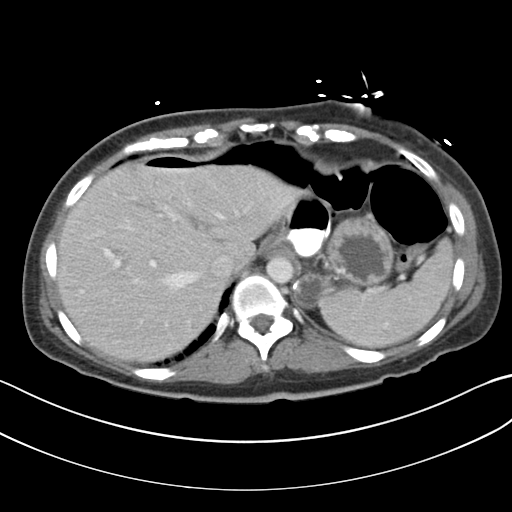
[im 91/96  soft-tissue]
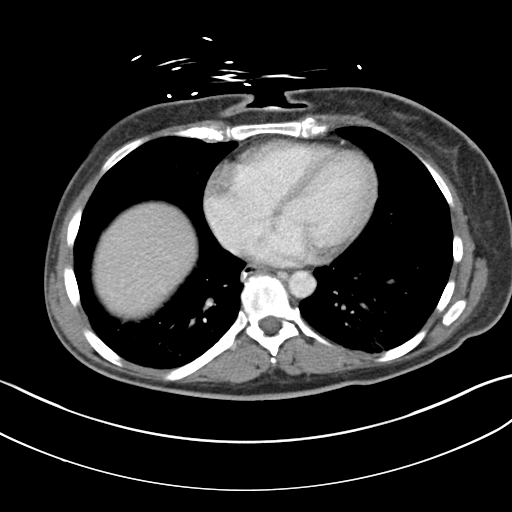

[Series 5: coronal a/|p · coronal · 0.78mm/px · 3 of 71 slices shown]
[im 24/71  soft-tissue]
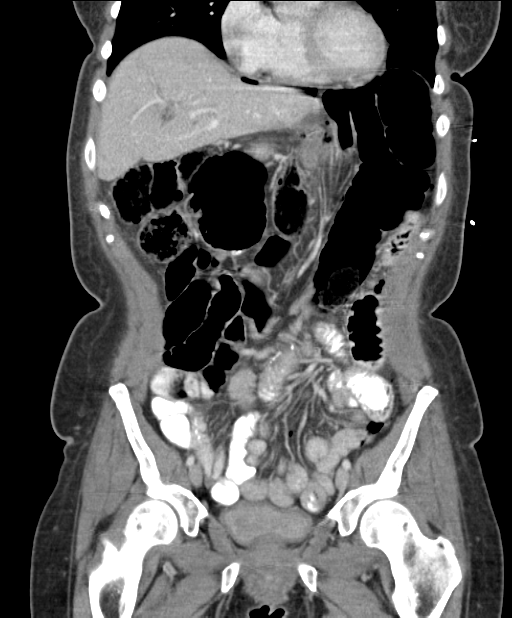
[im 32/71  soft-tissue]
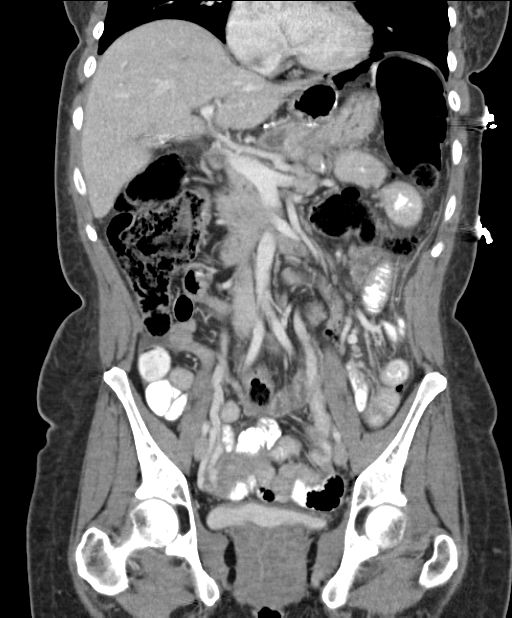
[im 39/71  soft-tissue]
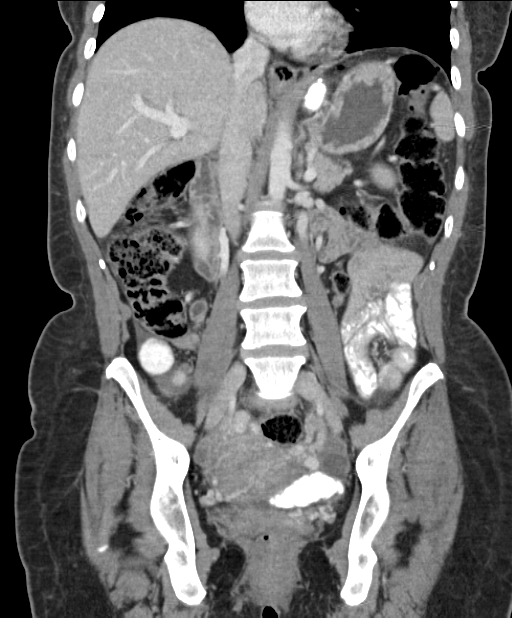

[16 of 46 positions shown; findings below may reference images not displayed]

FINDINGS: Lower chest: Lung bases are clear.

Hepatobiliary: No focal hepatic lesion. Postcholecystectomy. No
biliary dilatation.

Pancreas: Pancreas is normal. No ductal dilatation. No pancreatic
inflammation.

Spleen: Normal spleen

Adrenals/urinary tract: Adrenal glands and kidneys are normal. The
ureters and bladder normal.

Stomach/Bowel: Post bariatric surgery with gastric Roux-en-Y
anatomy. No evidence of obstruction. There is not a small moderate
volume intraperitoneal free air in the upper abdomen. Some of a free
air is adjacent to the the gastrojejunostomy (image 22, series 2).
No evidence of leak of oral contrast. The more distal enteric
enteric anastomosis (image 60, series 2) appears intact. There are
several small gas pockets adjacent to the enteric enteric
anastomosis.

Appendix is not identified. Moderate volume stool in the RIGHT
colon. LEFT colon rectosigmoid colon are normal.

Vascular/Lymphatic: Abdominal aorta is normal caliber. There is no
retroperitoneal or periportal lymphadenopathy. No pelvic
lymphadenopathy.

Reproductive: Uterus and ovaries are normal.

Other: Small amount free fluid the posterior cul-de-sac (image 75,
series 2) is relatively high density.

Musculoskeletal: No aggressive osseous lesion.
IMPRESSION: 1. Intraperitoneal free air without clear defined source.
2. Post gastric bypass surgery. Potential leaking site would be the
gastrojejunostomy. Less likely a source would be the enteric enteric
anastomosis.
3. Small amount of intraperitoneal free fluid in the pelvis is
relatively high density which could indicate leakage of oral
contrast. Hemoperitoneum is felt less likely.

## 2016-12-16 ENCOUNTER — Other Ambulatory Visit: Payer: Self-pay | Admitting: Family Medicine

## 2016-12-16 DIAGNOSIS — Z1231 Encounter for screening mammogram for malignant neoplasm of breast: Secondary | ICD-10-CM

## 2016-12-26 DIAGNOSIS — R600 Localized edema: Secondary | ICD-10-CM | POA: Diagnosis not present

## 2016-12-26 DIAGNOSIS — L923 Foreign body granuloma of the skin and subcutaneous tissue: Secondary | ICD-10-CM | POA: Diagnosis not present

## 2016-12-26 DIAGNOSIS — L0390 Cellulitis, unspecified: Secondary | ICD-10-CM | POA: Diagnosis not present

## 2016-12-30 ENCOUNTER — Ambulatory Visit
Admission: RE | Admit: 2016-12-30 | Discharge: 2016-12-30 | Disposition: A | Discharge status: Home / Self Care | Payer: 59 | Source: Ambulatory Visit | Attending: Family Medicine | Admitting: Family Medicine

## 2016-12-30 DIAGNOSIS — Z1231 Encounter for screening mammogram for malignant neoplasm of breast: Secondary | ICD-10-CM

## 2017-02-16 DIAGNOSIS — R609 Edema, unspecified: Secondary | ICD-10-CM | POA: Diagnosis not present

## 2017-05-01 ENCOUNTER — Encounter (HOSPITAL_COMMUNITY): Payer: Self-pay

## 2017-05-08 ENCOUNTER — Other Ambulatory Visit (HOSPITAL_COMMUNITY)
Admission: RE | Admit: 2017-05-08 | Discharge: 2017-05-08 | Disposition: A | Discharge status: Home / Self Care | Payer: 59 | Source: Ambulatory Visit | Attending: Family Medicine | Admitting: Family Medicine

## 2017-05-08 ENCOUNTER — Other Ambulatory Visit: Payer: Self-pay | Admitting: Family Medicine

## 2017-05-08 DIAGNOSIS — Z124 Encounter for screening for malignant neoplasm of cervix: Secondary | ICD-10-CM | POA: Diagnosis not present

## 2017-05-08 DIAGNOSIS — Z9884 Bariatric surgery status: Secondary | ICD-10-CM | POA: Diagnosis not present

## 2017-05-08 DIAGNOSIS — E559 Vitamin D deficiency, unspecified: Secondary | ICD-10-CM | POA: Diagnosis not present

## 2017-05-08 DIAGNOSIS — E039 Hypothyroidism, unspecified: Secondary | ICD-10-CM | POA: Diagnosis not present

## 2017-05-08 DIAGNOSIS — Z0000 Encounter for general adult medical examination without abnormal findings: Secondary | ICD-10-CM | POA: Diagnosis not present

## 2017-05-11 LAB — CYTOLOGY - PAP: Diagnosis: NEGATIVE

## 2017-06-14 DIAGNOSIS — L0390 Cellulitis, unspecified: Secondary | ICD-10-CM | POA: Diagnosis not present

## 2017-07-13 DIAGNOSIS — E039 Hypothyroidism, unspecified: Secondary | ICD-10-CM | POA: Diagnosis not present

## 2017-07-27 DIAGNOSIS — M25562 Pain in left knee: Secondary | ICD-10-CM | POA: Diagnosis not present

## 2017-08-07 ENCOUNTER — Other Ambulatory Visit: Payer: Self-pay | Admitting: Family Medicine

## 2017-08-07 DIAGNOSIS — Z1231 Encounter for screening mammogram for malignant neoplasm of breast: Secondary | ICD-10-CM

## 2017-08-08 DIAGNOSIS — M7061 Trochanteric bursitis, right hip: Secondary | ICD-10-CM | POA: Diagnosis not present

## 2017-08-12 DIAGNOSIS — Z23 Encounter for immunization: Secondary | ICD-10-CM | POA: Diagnosis not present

## 2017-08-23 DIAGNOSIS — Z9884 Bariatric surgery status: Secondary | ICD-10-CM | POA: Diagnosis not present

## 2017-08-23 DIAGNOSIS — R1110 Vomiting, unspecified: Secondary | ICD-10-CM | POA: Diagnosis not present

## 2017-08-25 ENCOUNTER — Other Ambulatory Visit: Payer: Self-pay | Admitting: Surgery

## 2017-08-25 DIAGNOSIS — Z9884 Bariatric surgery status: Secondary | ICD-10-CM

## 2017-08-29 ENCOUNTER — Ambulatory Visit (INDEPENDENT_AMBULATORY_CARE_PROVIDER_SITE_OTHER): Payer: 59 | Admitting: Family Medicine

## 2017-08-29 ENCOUNTER — Encounter: Payer: Self-pay | Admitting: Family Medicine

## 2017-08-29 DIAGNOSIS — F509 Eating disorder, unspecified: Secondary | ICD-10-CM | POA: Diagnosis not present

## 2017-08-29 NOTE — Patient Instructions (Addendum)
-   Food satisfaction is crucial to making good (sustainable) food choices.  We are designed to get pleasure from food.    - Over the next several weeks, please consider what YOU LIKE in foods.    - Recognize also that TASTE PREFERENCES ARE LEARNED.  This means it will get easier to choose foods you know are good for you if you are exposed to them enough.    - Eat at least 3 REAL meals and 1-2 snacks per day.  Aim for no more than 5 hours between eating.  Eat breakfast within one hour of getting up.   - A REAL meal includes at least some protein, some starch, and vegetables and/or fruit.    - Use a luncheon-sized plate to help limit portion sizes.    - Advance planning will be key to your making good food decisions.   - Obtain twice as many veg's as protein or carbohydrate foods for both lunch and dinner.  - Portion sizes: Aim for (for now) no more than ONE portion of a carb food per meal:   - ONE slice of bread (or its equivalent, such as half of a hamburger bun).    - 1/2 cup of a "scoopable" starchy food such as potatoes or rice.    - 15 grams of Total Carbohydrate as shown on food label.    - Protein foods:  Be cautious about which meats and how much you eat (aim for 3-4 oz); chew well, and avoid liquids at meals.    - Follow-up:  Let's plan to do blind weight checks here (none at home), and I promise to let you know if your weight increases or decreases >5 lb.   - Consider   A. Seeing a therapist again to work on that belief system (BS) that has derailed you so often over the years.    B. The Managing Emotional Eating class that starts Oct 29 at [facility]: [email or web address]  Book recommendations:  The End of Overeating, by Genene Churn Salt, Sugar, Fat, by _____________

## 2017-08-29 NOTE — Progress Notes (Signed)
PCP Kimberly Stains, MD  Medical Nutrition Therapy:  Appt start time: 1100 end time:  1200.  Assessment:  Primary concerns today: Disordered eating.  Kimberly Stanley was referred by Kimberly Stains, MD.  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  Her weight was 242 lb in Sept 2014; she had lost to 114 lb by January 2016, then started to regain.  Kimberly Stanley transitioned from nutrition care with RD [name] to RD Lynden Ang in Nov 2015, with concerns that she had developed symptoms of anorexia nervosa.  She saw Mickel Baas for several visits, but discontinued nutrition care in Jan of 2017.  In 2016, Kimberly Stanley saw therapist Esperanza Heir for ~every 4-6 weeks for about a year (has not seen her in more than a year now).    Kimberly Stanley went to [facility] for 6 wks of in-patient tx in March 2016, which she "fought all the way."  She felt that they had not worked with post-bariatric surg pts before, and didn't understand her needs related to s/p gastric bypass.  For example, she was advised to eat portion sizes that were larger than what she could tolerate, and she frequently vomited while in treatment there.  Kimberly Stanley left [employer] after insurance discontinued coverage, and still has a lot of anger about her experience there, including the fact that insurance ended up not covering $30,000 of the treatment.    Kimberly Stanley has been overweight as far back as she can remember, although as she looks back at pictures of herself, it was not until high school that she started to be overweight.  Kimberly Stanley's older brother used to call her fat and ugly, and Kimberly Stanley's mom also made many references to Kimberly Stanley's weight.  [name]'s mother also had extreme weight concerns, including complaining of a 2-lb weight gain literally two days before she died.    Kimberly Stanley has 3 children, all of whom are grown; currently in the household is her husband and 23-YO son.  Her husband is 6'4", and generally doesn't have weight problems.  She is not working outside the home  at this time, and tends to try to have the house "perfect" by the time her husband gets home from work.    Learning Readiness: Ready  Usual eating pattern includes 3 meals and 1 snack per day. Frequent foods and beverages include coffee w/ sugar-free flavored creamer, Powerade Zero, bkfst of Premier Protein shake (180 kcal), lunch of 5 oz Light 'n Fit Grk yogurt (80 kcal) or rotisserie chx sandwich, dinner of beans and/or chx, or fast food (~4 X wk).  Avoided foods include plain water (usually), chips.  Meat, rice, and many veg's (esp broc & Br sprouts) are not well tolerated since bariatric surgery.   Usual physical activity includes walking her dog (a Yorkie) ~20 min 5 X wk.  24-hr recall: (Up at 8 AM) B (9:45 AM)-   Premier shake, 32 oz 1 c coffee, 1/4 c sugar-free flavored creamer  Snk ( AM)-   --- L (3 PM)-  5 oz Light 'n Fit Grk yogurt (80 kcal) Snk (2 PM)-  16 oz 1 c coffee, 2 tbsp sugar-free flavored creamer D (6 PM)-  1 rotisserie chx (3 oz) sandwich, 2-3 tbsp light mayo, Powerade Zero Snk (9 PM)-  5 oz Light 'n Fit Grk yogurt (80 kcal) Snk (10:30)-  1/2 bag M-wave popcornTypical day? No.  Usually has lunch ~noon, which is a meat sandwich ~2 X wk, and yesterday was a light intake.    Progress Towards Goal(s):  In progress.   Nutritional Diagnosis:  NB-1.2 Harmful beliefs/attitudes about food or nutrition-related topics (use with caution) As related to weight management.  As evidenced by expressed anxiety regarding weight gain and strong beliefs about foods that should and should not be eaten.    Intervention:  Nutrition counseling.   Handouts given during visit include:  AVS  Meal planning form  Demonstrated degree of understanding via:  Teach Back  Barriers to learning/adherence to lifestyle change: Longstanding dysfunctional thoughts about food and weight, including sense of not deserving to eat.    Monitoring/Evaluation:  Dietary intake, exercise, and body weight Nov  15.

## 2017-08-30 ENCOUNTER — Ambulatory Visit: Payer: 59 | Admitting: Family Medicine

## 2017-08-31 ENCOUNTER — Other Ambulatory Visit: Payer: Self-pay | Admitting: Surgery

## 2017-08-31 ENCOUNTER — Ambulatory Visit
Admission: RE | Admit: 2017-08-31 | Discharge: 2017-08-31 | Disposition: A | Discharge status: Home / Self Care | Payer: 59 | Source: Ambulatory Visit | Attending: Surgery | Admitting: Surgery

## 2017-08-31 DIAGNOSIS — Z9884 Bariatric surgery status: Secondary | ICD-10-CM

## 2017-09-05 DIAGNOSIS — M7061 Trochanteric bursitis, right hip: Secondary | ICD-10-CM | POA: Diagnosis not present

## 2017-09-13 DIAGNOSIS — Z9884 Bariatric surgery status: Secondary | ICD-10-CM | POA: Diagnosis not present

## 2017-09-21 ENCOUNTER — Encounter: Payer: Self-pay | Admitting: Family Medicine

## 2017-09-21 ENCOUNTER — Ambulatory Visit (INDEPENDENT_AMBULATORY_CARE_PROVIDER_SITE_OTHER): Payer: 59 | Admitting: Family Medicine

## 2017-09-21 DIAGNOSIS — F509 Eating disorder, unspecified: Secondary | ICD-10-CM

## 2017-09-21 NOTE — Patient Instructions (Addendum)
Going on a diet implies this diet is temporary; there will be a time when you go OFF the diet, which just leads you back to where you started.    Paradigm shift:  Learn to eat well and practice good self-care, such as physical activity, allowing oneself pleasure, and getting adequate sleep and rest.    Life satisfaction:  If you get satisfaction and pleasure from the non-food areas of your life, you'll need food less to fill in the gaps.  When we don't practice self-kindness, we end up practicing self-indulgence.    What might be some benefits of dealing with disordered eating?  - It's a wake-up call to the idea that something needs to be addressed.    - What is that something?  Goals: 1. Eat at least 3 REAL meals and 1-2 snacks per day.  Aim for no more than 5 hours between eating.  Eat breakfast within one hour of getting up.   - A real meal includes protein, veg's, (and some starch).  - OR:  Would you serve this to a guest in your home, and call it a meal?  - A REAL meal should look like, taste like, and feel like a real meal.    - PLAN your day to make sure you can get something for lunch, or snacks as appropriate.     (Think about the WHERE, WHEN, and WHAT of lunch the next day.)   2. Start reading [employer] book Eating in the Light of the [place].  Come prepared to discuss.    3. Take the quiz at [facility]   Bring your score to next appt.    Therapist I highly recommend:  Everardo Beals.    (I will give you some feed back on Mickie Hillier book The [place] at your next appt.)

## 2017-09-21 NOTE — Progress Notes (Signed)
PCP Harlan Stains, MD  Medical Nutrition Therapy:  Appt start time: 1330 end time:  1430.  Assessment:  Primary concerns today: Disordered eating.  Mikhayla has been taking [employer] Living Well: Managing Emotional Eating class, which ends with the fourth class next week.  She has been exceptionally busy this week (helps care for her dad, among other things), so has sometimes missed meals.  She recognizes that this pattern doesn't work for best eating behaviors, i.e., "catches up" with her by dinner time, like last night when, feeling tired and hungry, she ordered pizza.  It was interesting that Viney did not remember having specifically discussed in class the concept that low blood sugar (skipping meals) diminishes willpower.    Keah is distressed that she now finds herself turning to food for comfort or stress relief.  She said she feels that her experience at [facility] "ruined" her ability to eat cautiously b/c she re-learned there to eat junk foods.  While she acknowledges that she had taken weight loss efforts too far, she feels this would have worked itself out in time.  I suggested to Abie that she is in fact working this out, but just not in the way she envisioned, and that her disordered eating may actually be a wake-up call that she needs to address a need.    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  Importantly, by the end of today's visit, Williemae asked for names of a therapist I would recommend.  I believe this will be extremely helpful to her, and may allow her to stop  using food as a means for coping with her stress and need for comfort.    Learning Readiness: Ready   Recent physical activity includes walking her dog (a Yorkie) ~20 min 5 X wk.  24-hr recall:  (Up at 9:30 AM) B (10:30 AM)-  1 protein shake, coffee, 20 oz diet flvrd creamer Snk ( AM)-  --- L ( PM)-  --- Snk (2 PM)-  5 oz Grk yogurt (80 kcal) D (7 PM)-  2 slc stuffed crust pizza, brd stick  Snk (8:30)-  1 slc apple pie Snk (9 PM)-  __________??? Typical day? No.  Doesn't usually have pizza for dinner.    Progress Towards Goal(s):  In progress.   Nutritional Diagnosis:  No progress on NB-1.2 Harmful beliefs/attitudes about food or nutrition-related topics (use with caution) As related to weight management.  As evidenced by expressed anxiety regarding weight gain and strong beliefs about foods that should and should not be eaten.    Intervention:  Nutrition counseling.   Handouts given during visit include:  AVS  Meal planning form  Demonstrated degree of understanding via:  Teach Back  Barriers to learning/adherence to lifestyle change: Longstanding dysfunctional thoughts about food and weight, including sense of not deserving to eat.    Monitoring/Evaluation:  Dietary intake, exercise, and body weight in 3 week(s).

## 2017-09-25 DIAGNOSIS — R609 Edema, unspecified: Secondary | ICD-10-CM | POA: Diagnosis not present

## 2017-10-11 DIAGNOSIS — M25551 Pain in right hip: Secondary | ICD-10-CM | POA: Diagnosis not present

## 2017-10-16 ENCOUNTER — Ambulatory Visit: Payer: 59 | Admitting: Family Medicine

## 2017-10-19 ENCOUNTER — Ambulatory Visit (INDEPENDENT_AMBULATORY_CARE_PROVIDER_SITE_OTHER): Payer: 59 | Admitting: Family Medicine

## 2017-10-19 ENCOUNTER — Encounter: Payer: Self-pay | Admitting: Family Medicine

## 2017-10-19 DIAGNOSIS — F509 Eating disorder, unspecified: Secondary | ICD-10-CM | POA: Diagnosis not present

## 2017-10-19 NOTE — Progress Notes (Signed)
PCP Harlan Stains, MD Therapist Cherie Ouch, LCSW  Medical Nutrition Therapy:  Appt start time: 1330 end time:  1430.  Assessment:  Primary concerns today: Disordered eating.  Shania is about half-way through the book Eating in the light of the [facility].  What resonates especially, for example: "[since she is no longer clear re. what she longs for, she assumes her hunger is a physical one.]"  Aanyah is still preoccupied with her experience of in-patient treatment at [facility] in [place], which she sees as having taken away her power over food.  Pre-treatment, she had learned to eat "good foods" and to avoid sweets and junk foods.  Her relationship with food was and is obviously very adversarial.  She describes getting power over, or "conquering" food, or food having power over her, with no middle ground, and no room for any kind of amicable relationship.    Brielle has been eating 3 meals a day only about 2-3 days a week, often spending time at her dad's over the lunch hour, or taking him to medical appts.  She has an appt to see therapist Cherie Ouch next week.    Progress Towards Goal(s):  In progress.   Nutritional Diagnosis:  No progress on NB-1.2 Harmful beliefs/attitudes about food or nutrition-related topics (use with caution) As related to weight management.  As evidenced by expressed anxiety regarding weight gain and strong beliefs about foods that should and should not be eaten.    Intervention:  Nutrition counseling.   Handouts given during visit include:  AVS  Demonstrated degree of understanding via:  Teach Back  Barriers to learning/adherence to lifestyle change: Longstanding dysfunctional thoughts about food and weight, including sense of not deserving to eat.    Monitoring/Evaluation:  Dietary intake, exercise, and body weight in 4 week(s).

## 2017-10-19 NOTE — Patient Instructions (Addendum)
"  Normal things are not happening to my body."    As long as you stay in an adversarial relationship with food, there will be few "normal" things happening in your body.  Food cannot be viewed and experienced as the enemy.    Goals:  1. Continue reading Eating in the Light of the [place].  2. Take the quiz at [facility].   Bring your score to next appt.   3. Eat THREE meals and at least 1 snack each day, including vegetables at both lunch and dinner.    The key to making this happen will be some advance planning.  Set aside a few minutes each weekend to plan some meals (and shopping) for the next week.  MAKE A LIST!  Another book suggestion: Making Peace with Food by Charlann Lange.

## 2017-10-24 DIAGNOSIS — M7061 Trochanteric bursitis, right hip: Secondary | ICD-10-CM | POA: Diagnosis not present

## 2017-10-26 DIAGNOSIS — Z9884 Bariatric surgery status: Secondary | ICD-10-CM | POA: Diagnosis not present

## 2017-11-08 DIAGNOSIS — M545 Low back pain: Secondary | ICD-10-CM | POA: Diagnosis not present

## 2017-11-08 DIAGNOSIS — M25551 Pain in right hip: Secondary | ICD-10-CM | POA: Diagnosis not present

## 2017-11-08 DIAGNOSIS — M5416 Radiculopathy, lumbar region: Secondary | ICD-10-CM | POA: Diagnosis not present

## 2017-11-10 DIAGNOSIS — M25551 Pain in right hip: Secondary | ICD-10-CM | POA: Diagnosis not present

## 2017-11-10 DIAGNOSIS — M5416 Radiculopathy, lumbar region: Secondary | ICD-10-CM | POA: Diagnosis not present

## 2017-11-10 DIAGNOSIS — M545 Low back pain: Secondary | ICD-10-CM | POA: Diagnosis not present

## 2017-11-14 DIAGNOSIS — M545 Low back pain: Secondary | ICD-10-CM | POA: Diagnosis not present

## 2017-11-14 DIAGNOSIS — M25551 Pain in right hip: Secondary | ICD-10-CM | POA: Diagnosis not present

## 2017-11-14 DIAGNOSIS — M5416 Radiculopathy, lumbar region: Secondary | ICD-10-CM | POA: Diagnosis not present

## 2017-11-16 DIAGNOSIS — M25551 Pain in right hip: Secondary | ICD-10-CM | POA: Diagnosis not present

## 2017-11-16 DIAGNOSIS — M5416 Radiculopathy, lumbar region: Secondary | ICD-10-CM | POA: Diagnosis not present

## 2017-11-16 DIAGNOSIS — M545 Low back pain: Secondary | ICD-10-CM | POA: Diagnosis not present

## 2017-11-21 DIAGNOSIS — M5416 Radiculopathy, lumbar region: Secondary | ICD-10-CM | POA: Diagnosis not present

## 2017-11-21 DIAGNOSIS — M545 Low back pain: Secondary | ICD-10-CM | POA: Diagnosis not present

## 2017-11-21 DIAGNOSIS — M25551 Pain in right hip: Secondary | ICD-10-CM | POA: Diagnosis not present

## 2017-11-23 DIAGNOSIS — M5416 Radiculopathy, lumbar region: Secondary | ICD-10-CM | POA: Diagnosis not present

## 2017-11-23 DIAGNOSIS — M545 Low back pain: Secondary | ICD-10-CM | POA: Diagnosis not present

## 2017-11-23 DIAGNOSIS — M25551 Pain in right hip: Secondary | ICD-10-CM | POA: Diagnosis not present

## 2017-11-28 DIAGNOSIS — M5416 Radiculopathy, lumbar region: Secondary | ICD-10-CM | POA: Diagnosis not present

## 2017-11-28 DIAGNOSIS — M545 Low back pain: Secondary | ICD-10-CM | POA: Diagnosis not present

## 2017-11-28 DIAGNOSIS — M25551 Pain in right hip: Secondary | ICD-10-CM | POA: Diagnosis not present

## 2017-11-30 ENCOUNTER — Ambulatory Visit: Payer: 59 | Admitting: Family Medicine

## 2017-11-30 DIAGNOSIS — M545 Low back pain: Secondary | ICD-10-CM | POA: Diagnosis not present

## 2017-11-30 DIAGNOSIS — M25551 Pain in right hip: Secondary | ICD-10-CM | POA: Diagnosis not present

## 2017-11-30 DIAGNOSIS — M5416 Radiculopathy, lumbar region: Secondary | ICD-10-CM | POA: Diagnosis not present

## 2017-12-05 DIAGNOSIS — M25551 Pain in right hip: Secondary | ICD-10-CM | POA: Diagnosis not present

## 2017-12-11 ENCOUNTER — Encounter: Payer: Self-pay | Admitting: Family Medicine

## 2017-12-11 ENCOUNTER — Ambulatory Visit (INDEPENDENT_AMBULATORY_CARE_PROVIDER_SITE_OTHER): Payer: 59 | Admitting: Family Medicine

## 2017-12-11 DIAGNOSIS — F509 Eating disorder, unspecified: Secondary | ICD-10-CM

## 2017-12-11 NOTE — Patient Instructions (Addendum)
-   All-or-nothing is a dangerous territory with respect to health.   - Back when you were at your lowest weight:   - Still wanted to get even lower.    - Felt fine physically.   - Felt elated each time scale indicated more weight lost.   - Felt depressed all day if weight went up.       - Would you have continued to lose weight or have had a rebound weight gain?      - How sustainable was your trajectory?  Please give this some focused attention.    - All good health resides in the middle ground.    - Extreme restriction is merely a false sense of control b/c it precludes CHOICE.  Food decisions in this case are compelled, not chosen.   - You have stated your belief, "If I start eating XXXX, I will not stop."  Another way of stating this is, "I can't let myself enjoy food too much, or I won't stop eating; appetite will be out of control."  This is unrealistic, and even counter-productive to actual control over appetite!  We are hard-wired to derive pleasure from food, so genuine appetite control comes from gaining satisfaction (both psychological and physiological) from eating, which fulfills both the brain's and the body's requirements.    - Core beliefs we establish at a young age filter all incoming information.    - Negative core beliefs are easily established in children who are especially sensitive, i.e., pick up on subtleties of words, actions, gestures.    Food Goals: 1. Incorporate some real food to breakfast and/or lunch at least 5 days a week.  For example, fruits and vegetables, maybe some beans, meat, and fish.   2. Set aside at least 20 minutes to sit down for each meal.   3. Self-compassion.org:  Take the quiz, and save your scores to discuss with Gwen and me at follow-up.    - Continue reading "Eating in the Light of the [place]on down some especially relevant concepts you want to discuss.

## 2017-12-11 NOTE — Progress Notes (Signed)
PCP Harlan Stains, MD Therapist Cherie Ouch, LCSW  Medical Nutrition Therapy:  Appt start time: 1130 end time:  1230.  Assessment:  Primary concerns today: Disordered eating.  Eevie requested that we skip a weight check today.  She has been experiencing cravings - especially for carb's - in the evenings, and she said she is overeating most evenings.  This is especially frustrating to her b/c before treatment at [facility] she had gained "complete control" over her appetite.  We discussed how her food decisions then were actually more out of compulsion than control (see Pt Instrxns).  Also discussed Brittanni's tendency to live in the "all-or-none," and how extremes of thinking/behaving are not usually associated with good health/happiness.    Bayla is still exclusively focused on weight, and she continues to measure herself by this criterion alone.  She mentioned today, "I grew up fat and ugly," although in seeing old photographs she acknowledges that this was not really true.  More accurate was the fact that she grew up hearing her 93-years-older brother saying she is fat and ugly. Marzetta Board admits she "worshipped" her older brother.    Kameran has been seeing counselor Cherie Ouch, whom she likes, but we didn't get a chance at today's appt to talk about how her therapy is going.    Usual eating pattern recently has been 3 meals and evening snacks.  Dinner often as late as 7 PM, usually eating in front of the TV, with snacks 8:30 or 9 PM.   Recent physical activity includes ~20 min PT exercises daily (back and leg exercises).     24-hr recall suggests intake of ~2400-3500 kcal (I suspect food portions were overestimated):  (Up at 8 AM) 16 oz coffee, ~4 tbsp sugar-free flavored creamer      80 kcal B (9:30 AM)-  8 oz Premier protein shake (30 g protein, 5 g CHO, 180 kcal)  180 Snk ( AM)-  ---         370 L (2 PM)-  1 Premier protein bar (30 g protein, 25 g CHO, 290 kcal), 16 oz coffee, ~4 tbsp S-F flvrd  creamer Snk (5 PM)-  8 oz water D (7-8:30)-  7-8 chx wings, 4-8 c chips, 1 c Fr onion dip     1830-2470 Snk (9:15)-  4 c frozen yogurt, 1 banana, 1/4 c sugar-free syrup, 8 oz water [phone] Typical day? No.  SuperBowl watching last night.  Bkfast and lunch were typical.    Progress Towards Goal(s):  In progress.   Nutritional Diagnosis:  No progress on NB-1.2 Harmful beliefs/attitudes about food or nutrition-related topics (use with caution) As related to weight management.  As evidenced by expressed anxiety regarding weight gain and strong beliefs about foods that should and should not be eaten.    Intervention:  Nutrition counseling.   Handouts given during visit include:  AVS  Demonstrated degree of understanding via:  Teach Back  Barriers to learning/adherence to lifestyle change: Longstanding dysfunctional thoughts about food and weight, including sense of not deserving to eat.    Monitoring/Evaluation:  Dietary intake, exercise, and body weight in 2 week(s).

## 2017-12-21 ENCOUNTER — Ambulatory Visit: Payer: 59 | Admitting: Family Medicine

## 2018-01-01 ENCOUNTER — Ambulatory Visit
Admission: RE | Admit: 2018-01-01 | Discharge: 2018-01-01 | Disposition: A | Discharge status: Home / Self Care | Payer: 59 | Source: Ambulatory Visit | Attending: Family Medicine | Admitting: Family Medicine

## 2018-01-01 DIAGNOSIS — Z1231 Encounter for screening mammogram for malignant neoplasm of breast: Secondary | ICD-10-CM

## 2018-01-09 ENCOUNTER — Ambulatory Visit: Payer: 59 | Admitting: Family Medicine

## 2018-02-06 ENCOUNTER — Encounter: Payer: Self-pay | Admitting: Family Medicine

## 2018-02-06 ENCOUNTER — Ambulatory Visit (INDEPENDENT_AMBULATORY_CARE_PROVIDER_SITE_OTHER): Payer: 59 | Admitting: Family Medicine

## 2018-02-06 DIAGNOSIS — F5000 Anorexia nervosa, unspecified: Secondary | ICD-10-CM

## 2018-02-06 NOTE — Progress Notes (Signed)
PCP Harlan Stains, MD Therapist Cherie Ouch, LCSW  Medical Nutrition Therapy:  Appt start time: 1330 end time:  1430.  Assessment:  Primary concerns today: Disordered eating.  Kimberly Stanley made the decision to weigh today, and although she weighed blindly, she asked the number before leaving.  Her weight is down almost 5 lb since December.  She has been seeing therapist Cherie Ouch twice monthly and she feels she has been getting a lot out of her sessions.  They have been working some on the self-compassion workbook Kimberly Stanley had started a few weeks ago.  Kimberly Stanley can at least recognize intellectually that she is very good at practicing compassion for others while finding it very difficult to do so for herself.    Kimberly Stanley has not been eating 3 real meals/day.  She often works through lunch with whatever she is doing, as she did yesterday (see recall below), and she said setting aside 20 min for lunch feels impossible to her.  She still struggles with noticing when she is hungry, although she has become better at knowing when she's full.   Recent eating pattern has included only breakfast and dinner, with a snack for lunch.   Recent physical activity includes ~20 min PT exercises daily (back and leg exercises).     24-hr recall:  (Up at 9:30 AM) B (10:30 AM)-  Protein shake (180 kcal, 30 g protein), 16 oz coffee diet flavored creamer Snk ( AM)-  Water, 16 oz coffee, diet flavored creamer L (4 PM)-  6 Nabs pack Snk ( PM)-  --- D (7 PM)-  3/4 of a Stamey's bbq on bun, 1/2 c slaw, water Snk ( PM)-  1/2 c Chex mix, 1 tiramasu Typical day? No.  Slept later than than usual, and doesn't usually have take-out bbq.    Progress Towards Goal(s):  In progress.   Nutritional Diagnosis:  No progress on NB-1.2 Harmful beliefs/attitudes about food or nutrition-related topics (use with caution) As related to weight management.  As evidenced by expressed anxiety regarding weight gain and strong beliefs about foods that should and  should not be eaten.    Intervention:  Nutrition counseling.   Handouts given during visit include:  AVS  Demonstrated degree of understanding via:  Teach Back  Barriers to learning/adherence to lifestyle change: Longstanding dysfunctional thoughts about food and weight, including sense of not deserving to eat.    Monitoring/Evaluation:  Dietary intake, exercise, and body weight in 4 week(s).

## 2018-02-06 NOTE — Patient Instructions (Addendum)
Food Goals: 1. Incorporate some real food to breakfast and/or lunch at least 5 days a week.  For example, fruits and vegetables, maybe some beans, meat, and fish.  (Crackers don't count as a real food component for bkfst or lunch.  You may feel more comfortable using a high-protein, low-carb bread or wrap, or sandwich thins/pita).   2. Set aside at least 20 minutes to sit down for each meal.    - Try reading with lunch.   3. Drink at least 48 oz of water daily.    - Bring your self-compassion quiz sub-scores to follow-up.

## 2018-03-12 ENCOUNTER — Ambulatory Visit (INDEPENDENT_AMBULATORY_CARE_PROVIDER_SITE_OTHER): Payer: 59 | Admitting: Family Medicine

## 2018-03-12 DIAGNOSIS — F509 Eating disorder, unspecified: Secondary | ICD-10-CM | POA: Diagnosis not present

## 2018-03-12 NOTE — Patient Instructions (Addendum)
-   Expectations:  Knowing your own and others' expectations makes your life easier and reduces conflict.  It also makes planning easier.    From last appt, April 2: Food Goals: 1. Incorporate some real food to breakfast and/or lunch at least 5 days a week. For example, fruits and vegetables, maybe some beans, meat, and fish. (Crackers don't count as a real food component for bkfst or lunch.  You may feel more comfortable using a high-protein, low-carb bread or wrap, or sandwich thins/pita).   2. Set aside at least 20 minutes to sit down for each meal.  (Try reading with lunch.)    - Setting aside time to eat and an opportunity to fully focus on what you're eating allows for greater satisfaction from that eating experience.  Food satisfaction usually means it's less likely you will get hungry as soon, or that you will experience food cravings later.  You don't want your meal to feel like a series of snacks! 3. Drink at least 48 oz of water daily.  Start your day with water! 4. [phone] four-step process of challenging invasive thoughts:  (1) Name it; (2) Frame it; (3) Envision; and (4) Intentional focus shifting.  Write your answers.    Example of using the Urge process from today's lunch time: STEP 1:  I am aware that I am having: - the thought that this little brag book is not perfect.   - the sensation of tightness in my chest and shakiness.  - the feelings of fear, inadequacy, guilt.  STEP 2:  This is just my brain trying to help me to meet legitimate needs! e.g., comfort, relief from anxiety, shame, fear, ... STEP 3: Visualize how you would ideally respond to this thought (how you would like to respond).  STEP 4: Immerse yourself as much as possible in some activity that feeds your soul (a great book, bible study, music [especially with headphones]).

## 2018-03-12 NOTE — Progress Notes (Signed)
PCP Harlan Stains, MD Therapist Cherie Ouch, LCSW  Medical Nutrition Therapy:  Appt start time: 1430 end time:  1530.  Assessment:  Primary concerns today: Disordered eating.  Jessice just returned from a week at [employer].  She said she is not eating well overall.  She has had a fair amt of pizza or fried chx and other takeout.  Also eating snacks, which she admits she feels more justified in buying when "on vacation."  Kelcey said she has not met any of the goals discussed at last appt.    Recent eating pattern has still included only breakfast and dinner, with a snack for lunch.   Recent physical activity includes none with regularity.  She walked on the beach on vacation, but little other exercise.    24-hr recall:  (Up at 8 AM) B (8:30 AM)-  1 Premier protein shake, 2 c coffee, 1/4 c fat-free flavored crmr (CoffeeMate) Snk ( AM)-  --- L (1 PM)-  1 hotdog, mustard, (finished coffee), 6 oz Powerade Snk (3 PM)-  8 oz water, 1 Snickers Bar D (6 PM)-  3 oz chx, 1 1/2 c mac&chs, 1/2 c mixed veg's Snk (9 PM)-  1 c Skittles, 2-3 c chips, 2/3 c dip Typical day? No.  Although it is fairly typical of recent eating.    Progress Towards Goal(s):  In progress.   Nutritional Diagnosis:  No progress on NB-1.2 Harmful beliefs/attitudes about food or nutrition-related topics (use with caution) As related to weight management.  As evidenced by expressed anxiety re. weight gain, and strong beliefs re. foods that should and should not be eaten.   Intervention:  Nutrition counseling.   Handouts given during visit include:  AVS  Urge Process handout  List of Feelings, Needs  Demonstrated degree of understanding via:  Teach Back  Barriers to learning/adherence to lifestyle change: Longstanding dysfunctional thoughts about food and weight, including sense of not deserving to eat.    Monitoring/Evaluation:  Dietary intake, exercise, and body weight in 4 week(s).

## 2018-04-10 ENCOUNTER — Ambulatory Visit: Payer: 59 | Admitting: Family Medicine

## 2018-04-10 ENCOUNTER — Encounter: Payer: Self-pay | Admitting: Family Medicine

## 2018-04-10 NOTE — Progress Notes (Signed)
Divine decided she wanted to know her weight today (169.0 lb).    Briefly discussed need to eat more earlier in the day as well as get at least 32 oz of water in before lunch, which may help her reduce evening snacking, which she still identifies as problematic.  In addition, more attention to food satiety and nutritional balance is likely to help her better manage appetite late in the day.  Perhaps more importantly, Marsa needs to find purposeful and meaningful activities in her life to fill the void of parenting now that her children are grown (daughter living in [place] and son about to move out of the house).  These events are very distressing to Deneene, and she is working with therapist Cherie Ouch on this.

## 2018-05-08 ENCOUNTER — Ambulatory Visit: Payer: 59 | Admitting: Family Medicine

## 2018-05-09 DIAGNOSIS — E039 Hypothyroidism, unspecified: Secondary | ICD-10-CM | POA: Diagnosis not present

## 2018-05-09 DIAGNOSIS — Z9884 Bariatric surgery status: Secondary | ICD-10-CM | POA: Diagnosis not present

## 2018-05-09 DIAGNOSIS — Z0000 Encounter for general adult medical examination without abnormal findings: Secondary | ICD-10-CM | POA: Diagnosis not present

## 2018-05-09 DIAGNOSIS — K911 Postgastric surgery syndromes: Secondary | ICD-10-CM | POA: Diagnosis not present

## 2018-05-09 DIAGNOSIS — E559 Vitamin D deficiency, unspecified: Secondary | ICD-10-CM | POA: Diagnosis not present

## 2018-05-21 ENCOUNTER — Ambulatory Visit (INDEPENDENT_AMBULATORY_CARE_PROVIDER_SITE_OTHER): Payer: 59 | Admitting: Family Medicine

## 2018-05-21 DIAGNOSIS — F509 Eating disorder, unspecified: Secondary | ICD-10-CM | POA: Diagnosis not present

## 2018-05-21 NOTE — Patient Instructions (Addendum)
-   Get back on your multi-vitamin-mineral supplement.   Goals: 1. Add some type of real, whole food to your breakfast shake.  For example:  - Fresh fruits (such as berries, cantaloupe, peaches) or 1-2 tbsp nuts/seeds or cheese stick.  2. Include at least a fruit and/or vegetable to both lunch and dinner at least 5 days a week.    - As you add vegetables, use small portions, and add them back progressively.    3. Set aside a few minutes each week (weekend?) to plan at least a few meals for the upcoming week.  Shop accordingly.    - Complete Goals Sheet, and bring to follow-up WEIGHT CHECK APPT on Aug 15 at 2:30.    - USE THE URGE PROCESS as negative thoughts come up!

## 2018-05-21 NOTE — Progress Notes (Signed)
PCP Harlan Stains, MD Therapist Cherie Ouch, LCSW  Medical Nutrition Therapy:  Appt start time: 1430 end time:  1530.  Assessment:  Primary concerns today: Disordered eating.  Kimberly Stanley has been pretty depressed recently.  She has continued seeing therapist Cherie Ouch every two weeks, which she said has been very helpful.  Although she has not worked with Meredith Mody on trying to figure out fulfilling ways she can focus her attention, she recently received a pistol from her husband, and has enjoyed going to the shooting range three times.  She said it felt cathartic for her to shoot, and it was all-absorbing.  Upon questioning, Kimberly Stanley said perhaps this in the one time she actually feels a sense of power, which may be part of why she enjoyed it so much.  I asked if there were other activities she can think of that allow her to be 100% present.  These included a recent baseball game, a couple of plays (at which her son was a musician), watching high-level basketball or tennis, and sometimes listening to her son play music.    Recent eating pattern includes breakfast and dinner, with a bagel with cream cheese for lunch.  She is usually getting at least 32 oz of water daily, and she is drinking ~32 oz of [employer] cold coffee drink (~360-480 kcal/day depending on diet or regular).   Recent physical activity includes none with regularity.   Sleep: Estimates she is getting ~8 hrs of restless sleep nightly.  Usually up 3-4 X during the night.    24-hr recall:  (Up at 7:30 AM; water with levothyroxine) B (8:30 AM)-  1 Premier shake (180 kcal) Snk ( AM)-  1 c coffee, 1/3 c s-f flavored creamer L (1:30 PM)-  2 Eggo waffles, 1/2 c syrup Snk ( PM)-  32 oz cold coffee D (7 PM)-  1 slc pizza, 3 small pc cheesey bread, water Snk (10:30)-  1 c ice cream Typical day? No. although breakfast was normal yesterday.  Rarely making food at home lately.  Often having takeout for dinner.  Daily intake usually 1 cup ice  cream and ~32 oz sweet cold coffee drink.    Progress Towards Goal(s):  In progress.   Nutritional Diagnosis:  No progress on NB-1.2 Harmful beliefs/attitudes about food or nutrition-related topics (use with caution) As related to weight management.  As evidenced by expressed anxiety re. weight gain, and strong beliefs re. foods that should and should not be eaten.   Intervention:  Nutrition counseling.   Handouts given during visit include:  AVS  Demonstrated degree of understanding via:  Teach Back  Barriers to learning/adherence to lifestyle change: Longstanding dysfunctional thoughts about food and weight, including sense of not deserving to eat.    Monitoring/Evaluation:  Dietary intake, exercise, and body weight in 4 week(s). From appt, April 2: Food Goals: 1. Incorporate some real food to breakfast and/or lunch at least 5 days a week. For example, fruits and vegetables, maybe some beans, meat, and fish.(Crackers don't count as a real food component for bkfst or lunch. You may feel more comfortable using a high-protein, low-carb bread or wrap, or sandwich thins/pita).  2. Set aside at least 20 minutes to sit down for each meal. (Try reading with lunch.)             - Setting aside time to eat and an opportunity to fully focus on what you're eating allows for greater satisfaction from that eating experience.  Food  satisfaction usually means it's less likely you will get hungry as soon, or that you will experience food cravings later.  You don't want your meal to feel like a series of snacks! 3.Drink at least 48 oz of water daily. Start your day with water! 4. [phone] four-step process of challenging invasive thoughts:  (1) Name it; (2) Frame it; (3) Envision; and (4) Intentional focus shifting.  Write your answers.

## 2018-06-01 ENCOUNTER — Other Ambulatory Visit: Payer: Self-pay | Admitting: Physician Assistant

## 2018-06-01 DIAGNOSIS — Z1211 Encounter for screening for malignant neoplasm of colon: Secondary | ICD-10-CM | POA: Diagnosis not present

## 2018-06-01 DIAGNOSIS — R1010 Upper abdominal pain, unspecified: Secondary | ICD-10-CM | POA: Diagnosis not present

## 2018-06-01 DIAGNOSIS — R1310 Dysphagia, unspecified: Secondary | ICD-10-CM

## 2018-06-01 DIAGNOSIS — R1111 Vomiting without nausea: Secondary | ICD-10-CM | POA: Diagnosis not present

## 2018-06-04 ENCOUNTER — Ambulatory Visit
Admission: RE | Admit: 2018-06-04 | Discharge: 2018-06-04 | Disposition: A | Discharge status: Home / Self Care | Payer: 59 | Source: Ambulatory Visit | Attending: Physician Assistant | Admitting: Physician Assistant

## 2018-06-04 DIAGNOSIS — R1310 Dysphagia, unspecified: Secondary | ICD-10-CM

## 2018-06-04 DIAGNOSIS — K219 Gastro-esophageal reflux disease without esophagitis: Secondary | ICD-10-CM | POA: Diagnosis not present

## 2018-06-04 DIAGNOSIS — R1010 Upper abdominal pain, unspecified: Secondary | ICD-10-CM

## 2018-06-27 DIAGNOSIS — E039 Hypothyroidism, unspecified: Secondary | ICD-10-CM | POA: Diagnosis not present

## 2018-06-27 DIAGNOSIS — R2232 Localized swelling, mass and lump, left upper limb: Secondary | ICD-10-CM | POA: Diagnosis not present

## 2018-06-27 DIAGNOSIS — S90121A Contusion of right lesser toe(s) without damage to nail, initial encounter: Secondary | ICD-10-CM | POA: Diagnosis not present

## 2018-06-28 DIAGNOSIS — M25551 Pain in right hip: Secondary | ICD-10-CM | POA: Diagnosis not present

## 2018-06-29 DIAGNOSIS — K279 Peptic ulcer, site unspecified, unspecified as acute or chronic, without hemorrhage or perforation: Secondary | ICD-10-CM | POA: Diagnosis not present

## 2018-06-29 DIAGNOSIS — R1010 Upper abdominal pain, unspecified: Secondary | ICD-10-CM | POA: Diagnosis not present

## 2018-06-29 DIAGNOSIS — R1310 Dysphagia, unspecified: Secondary | ICD-10-CM | POA: Diagnosis not present

## 2018-07-03 DIAGNOSIS — R1310 Dysphagia, unspecified: Secondary | ICD-10-CM | POA: Diagnosis not present

## 2018-07-03 DIAGNOSIS — R1013 Epigastric pain: Secondary | ICD-10-CM | POA: Diagnosis not present

## 2018-07-04 DIAGNOSIS — D2362 Other benign neoplasm of skin of left upper limb, including shoulder: Secondary | ICD-10-CM | POA: Diagnosis not present

## 2018-07-04 DIAGNOSIS — D492 Neoplasm of unspecified behavior of bone, soft tissue, and skin: Secondary | ICD-10-CM | POA: Diagnosis not present

## 2018-07-23 ENCOUNTER — Encounter: Payer: Self-pay | Admitting: Licensed Clinical Social Worker

## 2018-08-10 DIAGNOSIS — Z23 Encounter for immunization: Secondary | ICD-10-CM | POA: Diagnosis not present

## 2018-08-14 ENCOUNTER — Encounter: Payer: Self-pay | Admitting: Licensed Clinical Social Worker

## 2018-08-16 ENCOUNTER — Encounter: Payer: Self-pay | Admitting: Licensed Clinical Social Worker

## 2018-08-16 ENCOUNTER — Inpatient Hospital Stay: Payer: 59 | Attending: Genetic Counselor | Admitting: Licensed Clinical Social Worker

## 2018-08-16 ENCOUNTER — Inpatient Hospital Stay: Payer: 59

## 2018-08-16 DIAGNOSIS — D239 Other benign neoplasm of skin, unspecified: Secondary | ICD-10-CM | POA: Insufficient documentation

## 2018-08-16 DIAGNOSIS — Z1379 Encounter for other screening for genetic and chromosomal anomalies: Secondary | ICD-10-CM

## 2018-08-16 DIAGNOSIS — Z800 Family history of malignant neoplasm of digestive organs: Secondary | ICD-10-CM

## 2018-08-16 DIAGNOSIS — Z803 Family history of malignant neoplasm of breast: Secondary | ICD-10-CM | POA: Diagnosis not present

## 2018-08-16 NOTE — Progress Notes (Signed)
REFERRING PROVIDER: Harlan Stains, MD [place], [place] [address]  PRIMARY PROVIDER:  Harlan Stains, MD  PRIMARY REASON FOR VISIT:  1. Cutaneous leiomyoma   2. Family history of breast cancer   3. Family history of pancreatic cancer     HISTORY OF PRESENT ILLNESS:   Ms. Headlee, a 52 y.o. female, was seen for a [place] cancer genetics consultation at the request of Dr. Dema Severin due to a family history of cancer and personal history of cutaneous leiomyoma.  Ms. Lucarelli presents to clinic today to discuss the possibility of a hereditary predisposition to cancer, genetic testing, and to further clarify her future cancer risks, as well as potential cancer risks for family members.   Ms. Detter is a 52 y.o. female with no personal history of cancer.  She was recently found to have a cutaneous leiomyoma.   CANCER HISTORY:   No history exists.    HORMONAL RISK FACTORS:  Menarche was at age 10.  First live birth at age 21.  OCP use for approximately 0 years.  Ovaries intact: yes.  Hysterectomy: yes.  Menopausal status: premenopausal.  HRT use: 0 years. Colonoscopy: no; not examined. Mammogram within the last year: yes. Number of breast biopsies: 0.  Past Medical History:  Diagnosis Date  . Cutaneous leiomyoma   . Depression   . Eating disorder    loose alot of weight since bypass  . Family history of breast cancer   . Family history of pancreatic cancer   . Hypertension   . Hypothyroidism   . Thyroid disease   . Wears contact lenses     Past Surgical History:  Procedure Laterality Date  . CHOLECYSTECTOMY    . DILATION AND CURETTAGE OF UTERUS    . GASTRIC ROUX-EN-Y N/A 10/01/2013   Procedure: LAPAROSCOPIC ROUX-EN-Y GASTRIC BYPASS with hiatal hernia repair, WITH UPPER ENDOSCOPY;  Surgeon: Madilyn Hook, DO;  Location: [facility];  Service: General;  Laterality: N/A;  . LAPAROSCOPY N/A 09/23/2015   Procedure: LAPAROSCOPY DIAGNOSTIC placement of drain;   Surgeon: Johnathan Hausen, MD;  Location: [facility];  Service: General;  Laterality: N/A;  . SUPERFICIAL PERONEAL NERVE RELEASE Left 10/24/2014   Procedure: SUPERFICIAL PERONEAL NERVE RELEASE LEFT;  Surgeon: Alta Corning, MD;  Location: [facility];  Service: Orthopedics;  Laterality: Left;  . TUBAL LIGATION    . UPPER GI ENDOSCOPY N/A 09/23/2015   Procedure: UPPER GI ENDOSCOPY;  Surgeon: Johnathan Hausen, MD;  Location: [facility];  Service: General;  Laterality: N/A;    Social History   Socioeconomic History  . Marital status: Married    Spouse name: Not on file  . Number of children: Not on file  . Years of education: Not on file  . Highest education level: Not on file  Occupational History  . Not on file  Social Needs  . Financial resource strain: Not on file  . Food insecurity:    Worry: Not on file    Inability: Not on file  . Transportation needs:    Medical: Not on file    Non-medical: Not on file  Tobacco Use  . Smoking status: Former Smoker    Packs/day: 0.50    Years: 15.00    Pack years: 7.50    Types: Cigarettes    Last attempt to quit: 07/05/2013    Years since quitting: 5.1  . Smokeless tobacco: Never Used  Substance and Sexual Activity  . Alcohol use: No  Alcohol/week: 20.0 standard drinks    Types: 10 Shots of liquor, 10 Standard drinks or equivalent per week  . Drug use: Yes    Types: "Crack" cocaine    Comment: MAY 2013 LAST DRUG USE  . Sexual activity: Not on file    Comment: Novasure  Lifestyle  . Physical activity:    Days per week: Not on file    Minutes per session: Not on file  . Stress: Not on file  Relationships  . Social connections:    Talks on phone: Not on file    Gets together: Not on file    Attends religious service: Not on file    Active member of club or organization: Not on file    Attends meetings of clubs or organizations: Not on file    Relationship status: Not on file  Other Topics Concern  . Not on file  Social  History Narrative  . Not on file     FAMILY HISTORY:  We obtained a detailed, 4-generation family history.  Significant diagnoses are listed below: Family History  Problem Relation Age of Onset  . Breast cancer Mother 61       recurrence, metastatic, d 12  . Pancreatic cancer Paternal Uncle 34    Ms. Takacs has a son, 28, and two daughters, 45 and 92. Her oldest daughter is pregnant. Ms. Heatwole has a brother, 45, and a brother and sister who are twins, 49. No cancer history for her siblings, nieces or nephews.  Ms. Rosello father is 59. He has two brothers and a sister. One of his brothers was diagnosed with pancreatic cancer at 50 and died at 69. His other brother is living at 52 and Ms. Rembert has no information about her aunt. Ms. Waln paternal grandfather died at 68 and she does not have information about her paternal grandmother.  Ms. Faulds mother was diagnosed with breast cancer at 8. There was a recurrence and the cancer became metastatic and she died at 27. Ms. Brummitt has two maternal aunts, 68 and 68, with no cancer history. Her maternal grandfather died in his  15's and had dementia, and her maternal grandmother died young in a car accident.   Ms. Grable is unaware of previous family history of genetic testing for hereditary cancer risks. Patient's maternal ancestors are of German/Irish descent, and paternal ancestors are of German/Irish descent. There is no reported Ashkenazi Jewish ancestry. There is no known consanguinity.  GENETIC COUNSELING ASSESSMENT: ASHRITHA DESROSIERS is a 52 y.o. female with a personal and family history which is somewhat suggestive of a Hereditary Cancer Predisposition Syndrome. We, therefore, discussed and recommended the following at today's visit.   DISCUSSION: We discussed that about 5-10% of cancer cases are hereditary. We discussed the BRCA genes in particular given her mother and uncle's history, noting that there are many other genes we can  test for. We also dicussed Hereditary Leiomyomatosis and Renal Cell Cancer (HLRCC) given her recent cutaneous leiomyoma. This condition increases the risk for renal cell cancer.  We reviewed the characteristics, features and inheritance patterns of hereditary cancer syndromes. We also discussed genetic testing, including the appropriate family members to test, the process of testing, insurance coverage and turn-around-time for results. We discussed the implications of a negative, positive and/or variant of uncertain significant result. We recommended Ms. Reimann pursue genetic testing for the [facility] Multi-Cancer Panel.  The Multi-Cancer Panel offered by [facility] includes sequencing and/or deletion duplication testing of the following 84  genes: AIP, ALK, APC, ATM, AXIN2,BAP1,  BARD1, BLM, BMPR1A, BRCA1, BRCA2, BRIP1, CASR, CDC73, CDH1, CDK4, CDKN1B, CDKN1C, CDKN2A (p14ARF), CDKN2A (p16INK4a), CEBPA, CHEK2, CTNNA1, DICER1, DIS3L2, EGFR (c.2369C>T, p.Thr790Met variant only), EPCAM (Deletion/duplication testing only), FH, FLCN, GATA2, GPC3, GREM1 (Promoter region deletion/duplication testing only), HOXB13 (c.251G>A, p.Gly84Glu), HRAS, KIT, MAX, MEN1, MET, MITF (c.952G>A, p.Glu318Lys variant only), MLH1, MSH2, MSH3, MSH6, MUTYH, NBN, NF1, NF2, NTHL1, PALB2, PDGFRA, PHOX2B, PMS2, POLD1, POLE, POT1, PRKAR1A, PTCH1, PTEN, RAD50, RAD51C, RAD51D, RB1, RECQL4, RET, RUNX1, SDHAF2, SDHA (sequence changes only), SDHB, SDHC, SDHD, SMAD4, SMARCA4, SMARCB1, SMARCE1, STK11, SUFU, TERC, TERT, TMEM127, TP53, TSC1, TSC2, VHL, WRN and WT1.   We discussed that if she is found to have a mutation in one of these genes, it may impact future medical management recommendations such as increased cancer screenings and consideration of risk reducing surgeries.  A positive result could also have implications for the patient's family members.  A Negative result would mean we were unable to identify a hereditary component to her family of  cancer but does not rule out the possibility of a hereditary basis for her family of cancer.  There could be mutations that are undetectable by current technology, or in genes not yet tested or identified to increase cancer risk.    We discussed the potential to find a Variant of Uncertain Significance or VUS.  These are variants that have not yet been identified as pathogenic or benign, and it is unknown if this variant is associated with increased cancer risk or if this is a normal finding.  Most VUS's are reclassified to benign or likely benign.   It should not be used to make medical management decisions. With time, we suspect the lab will determine the significance of any VUS's identified if any.   Based on Ms. Mohamud's family history of pancreatic cancer she meets NCCN medical criteria for genetic testing. Despite that she meets criteria, she may still have an out of pocket cost. The lab will notify her of an OOP.  Based on the patient's personal and family history, the statistical model Harriett Rush was used to estimate her risk of developing breast cancer. This estimates her lifetime risk of developing breast cancer to be approximately 15.4%. This estimation is in the setting of negative test results and may change if she has a positive genetic test result.  The patient's lifetime breast cancer risk is a preliminary estimate based on available information using one of several models endorsed by the [place] (ACS). The ACS recommends consideration of breast MRI screening as an adjunct to mammography for patients at high risk (defined as 20% or greater lifetime risk). A more detailed breast cancer risk assessment can be considered, if clinically indicated.      Additionally we discussed that some people do not want to undergo genetic testing due to fear of genetic discrimination.  A federal law called the Genetic Information Non-Discrimination Act (GINA) of 2008 helps protect  individuals against genetic discrimination based on their genetic test results.  It impacts both health insurance and employment.  For health insurance, it protects against increased premiums, being kicked off insurance or being forced to take a test in order to be insured.  For employment it protects against hiring, firing and promoting decisions based on genetic test results.  Health status due to a cancer diagnosis is not protected under GINA.  This law does not protect life insurance, disability insurance, or other types of insurance.   PLAN:  After considering the risks, benefits, and limitations, Ms. Notte  provided informed consent to pursue genetic testing and the blood sample was sent to [facility] for analysis of the Multi-Cancer Panel. Results should be available within approximately 2-3 weeks' time, at which point they will be disclosed by telephone to Ms. Mcinnis, as will any additional recommendations warranted by these results. Ms. Kawahara will receive a summary of her genetic counseling visit and a copy of her results once available. This information will also be available in Epic.  Based on Ms. Atchley's family history, we recommended her father and paternal uncle have genetic counseling and testing regardless of her results given their brother's pancreatic cancer. Ms. Vessels will let us know if we can be of any assistance in coordinating genetic counseling and/or testing for this family member.   Lastly, we encouraged Ms. Bozman to remain in contact with cancer genetics annually so that we can continuously update the family history and inform her of any changes in cancer genetics and testing that may be of benefit for this family.   Ms.  Cerny questions were answered to her satisfaction today. Our contact information was provided should additional questions or concerns arise. Thank you for the referral and allowing [place] to share in the care of your patient.   Faith Rogue,  MS Genetic Counselor [email or web address] Phone: [phone]  The patient was seen for a total of 40 minutes in face-to-face genetic counseling.

## 2018-08-23 ENCOUNTER — Encounter: Payer: Self-pay | Admitting: Licensed Clinical Social Worker

## 2018-08-23 ENCOUNTER — Telehealth: Payer: Self-pay | Admitting: Licensed Clinical Social Worker

## 2018-08-23 DIAGNOSIS — Z1379 Encounter for other screening for genetic and chromosomal anomalies: Secondary | ICD-10-CM | POA: Insufficient documentation

## 2018-08-24 ENCOUNTER — Encounter: Payer: Self-pay | Admitting: Licensed Clinical Social Worker

## 2018-08-24 ENCOUNTER — Ambulatory Visit: Payer: Self-pay | Admitting: Licensed Clinical Social Worker

## 2018-08-24 DIAGNOSIS — Z803 Family history of malignant neoplasm of breast: Secondary | ICD-10-CM

## 2018-08-24 DIAGNOSIS — D239 Other benign neoplasm of skin, unspecified: Secondary | ICD-10-CM

## 2018-08-24 DIAGNOSIS — Z1379 Encounter for other screening for genetic and chromosomal anomalies: Secondary | ICD-10-CM

## 2018-08-24 DIAGNOSIS — Z800 Family history of malignant neoplasm of digestive organs: Secondary | ICD-10-CM

## 2018-08-24 NOTE — Telephone Encounter (Signed)
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 We discussed the increased cancer risks and current management guidelines. We also discussed family members to test next. We discussed that a VUS in NF1 and  VUS in TSC1 were also identified.

## 2018-08-24 NOTE — Progress Notes (Signed)
GENETIC TEST RESULTS   Patient Name: Kimberly Stanley Patient Age: 52 y.o. Encounter Date: 08/24/2018  Referring Provider: Harlan Stains, MD  Kimberly Stanley was seen in the [facility] on 08/16/2018 due to a personal history of cutaneous leiomyoma, family history of cancer and concern regarding a hereditary predisposition to cancer in the family. Please refer to the prior Genetics clinic note for more information regarding Kimberly Stanley's medical and family histories and our assessment at the time.   FAMILY HISTORY:  We obtained a detailed, 4-generation family history.  Significant diagnoses are listed below: Family History  Problem Relation Age of Onset  . Breast cancer Mother 11       recurrence, metastatic, d 36  . Pancreatic cancer Paternal Uncle 33   Kimberly Stanley has a son, 81, and two daughters, 67 and 57. Her oldest daughter is pregnant. Kimberly Stanley has a brother, 50, and a brother and sister who are twins, 53. No cancer history for her siblings, nieces or nephews.  Kimberly Stanley father is 40. He has two brothers and a sister. One of his brothers was diagnosed with pancreatic cancer at 91 and died at 54. His other brother is living at 40 and Kimberly Stanley has no information about her aunt. Kimberly Stanley paternal grandfather died at 34 and she does not have information about her paternal grandmother.  Kimberly Stanley mother was diagnosed with breast cancer at 17. There was a recurrence and the cancer became metastatic and she died at 23. Kimberly Stanley has two maternal aunts, 15 and 74, with no cancer history. Her maternal grandfather died in his  75's and had dementia, and her maternal grandmother died young in a car accident.   Kimberly Stanley is unaware of previous family history of genetic testing for hereditary cancer risks. Patient's maternal ancestors are of German/Irish descent, and paternal ancestors are of German/Irish descent. There is no reported Ashkenazi Jewish ancestry. There is no  known consanguinity  GENETIC TESTING:  At the time of Kimberly Stanley visit, we recommended she pursue genetic testing. 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 There were no deleterious mutations in: AIP, ALK, APC, ATM, AXIN2,BAP1,  BARD1, BLM, BMPR1A, BRCA1, BRCA2, BRIP1, CASR, CDC73, CDH1, CDK4, CDKN1B, CDKN1C, CDKN2A (p14ARF), CDKN2A (p16INK4a), CEBPA, CTNNA1, DICER1, DIS3L2, EGFR (c.2369C>T, p.Thr790Met variant only), EPCAM (Deletion/duplication testing only), FH, FLCN, GATA2, GPC3, GREM1 (Promoter region deletion/duplication testing only), HOXB13 (c.251G>A, p.Gly84Glu), HRAS, KIT, MAX, MEN1, MET, MITF (c.952G>A, p.Glu318Lys variant only), MLH1, MSH2, MSH3, MSH6, MUTYH, NBN, NF1, NF2, NTHL1, PALB2, PDGFRA, PHOX2B, PMS2, POLD1, POLE, POT1, PRKAR1A, PTCH1, PTEN, RAD50, RAD51C, RAD51D, RB1, RECQL4, RET, RUNX1, SDHAF2, SDHA (sequence changes only), SDHB, SDHC, SDHD, SMAD4, SMARCA4, SMARCB1, SMARCE1, STK11, SUFU, TERC, TERT, TMEM127, TP53, TSC1, TSC2, VHL, WRN and WT1.    MEDICAL MANAGEMENT:  CHEK2 CHEK2 pathogenic variants are associated with an increased risk of breast, colon, and other cancers. The estimated cancer risks vary widely and may be influenced by family history.    Breast Cancer: Women with a CHEK2 deleterious mutation have approximately a 24% (no family history of breast cancer) to 48% (strong family history of breast cancer) lifetime risk of breast cancer and up to a 25% risk of a second breast cancer. Men may have a somewhat increased risk for female breast cancer.  This risk is not nearly as high for men as it is for women,  but is not well defined.    According to the NCCN guidelines, women with CHEK2 mutations should consider breast MRI's as a part of regular breast cancer screening, and depending on family history could consider a  risk-reducing mastectomy. For women, breast cancer screening should begin at age 71 or 75 years younger than the earliest age at breast cancer diagnosis in the family. Additional publications underscore the appropriateness of breast MRIs and consideration of chemoprevention (tamoxifen) due to the greater than 20% lifetime risk of breast cancer associated with the 1100delC variant (PMID: 93734287, 68115726).    For men- Because there are no specific recommendations regarding breast screening for men with CHEK2 mutations, we often refer to the BRCA screening recommendations as general guidelines on how to screen for this cancer risks.     1. Female Breast self-exam training and education starting at age 65.  2. Clinical breast exam, every 6-12 months, starting at age 57 years   Baseline mammogram can be considered, but there are no guidelines regarding this screening and the benefits are not well studied.    Colon Cancer: Men and women with CHEK2 mutations have an increased risk of colon cancer (~10% lifetime risk).    Colonoscopy screening every 5 years beginning at age 62 (or more often based on polyp findings/ GI physician recommendations).   If an individual has a first-degree relative with colorectal cancer, screening should begin 10 years prior to the relative's age at diagnosis if before 106.   If an individual has a personal history of colorectal cancer, screening recommendations should be based on recommendations for post-colorectal cancer resection.   Prostate Cancer: The risk of prostate cancer in men with a CHEK2 mutation is estimated to be increased up to 30%.   However, the benefits of screening for prostate cancer among men with a pathogenic variant in CHEK2 are uncertain (PMID: 20355974).      Men may consider prostate cancer screening starting at age 48 years.  (BRCA prostate screening NCCN recommendations)  It has been suggested that men with a CHEK2 pathogenic variant and a  first-degree relative with prostate cancer have an annual prostate-specific antigen (PSA) analysis (PMID: 16384536).    Other Cancer Risks: CHEK2 mutations may be associated with other malignancies as well such as thyroid, melanoma, bladder, kidney.  The rates vary from family to family and more research needs to be done to figure out specific risks.    Ovarian and uterine cancers have been reported in [place] families but it is currently unknown whether there is an associated increased ovarian or uterine cancer risk.  FAMILY MEMBERS: We discussed that it is important that all Ms. Colin's relatives (both men and women) know of the presence of this gene mutation.  Women need to know that they may be at increased risk for breast and colon cancers.  Men are at slightly increased risk for breast, prostate and colon cancers.  Her children and siblings are at 50% risk to have inherited the mutation. We recommend they have genetic testing for this same mutation, as identifying the presence of this mutation would allow them to also take advantage of risk-reducing measures.    PLAN: 1. These results will be made available to her primary care provider, Dr. Harlan Stains. I offered KimberlyConard a referral to the high risk breast clinic here for the management of her breast cancer screening which she accepted.  2. Ms. Stalnaker plans to discuss these results with her family and will reach out  to [place] if we can be of any assistance in coordinating genetic testing for any of her relatives.   3. Additionally I offered to have Ms. Dominy come back and meet me with me in person to discuss her results further which she declined at this time.    SUPPORT AND RESOURCES: If Ms. Mcnicholas is interested in in support, there are two groups. Facing Our Risk (www.facingourrisk.com) and Bright Pink (www.brightpink.org) which some people have found useful. They provide opportunities to speak with other individuals from high-risk families.  To locate genetic counselors in other cities, visit the website of the [employer] (ArtistMovie.se) and Secretary/administrator for a Social worker by zip code.  We encouraged Ms. Lyons to remain in contact with [place] on an annual basis so we can update her personal and family histories, and let her know of advances in cancer genetics that may benefit the family. Our contact number was provided. Ms. Duford questions were answered to her satisfaction today, and she knows she is welcome to call anytime with additional questions.   Faith Rogue, MS Genetic Counselor [email or web address] Phone: [phone]

## 2018-09-13 ENCOUNTER — Telehealth: Payer: Self-pay | Admitting: Hematology

## 2018-09-13 DIAGNOSIS — L814 Other melanin hyperpigmentation: Secondary | ICD-10-CM | POA: Diagnosis not present

## 2018-09-13 DIAGNOSIS — D485 Neoplasm of uncertain behavior of skin: Secondary | ICD-10-CM | POA: Diagnosis not present

## 2018-09-13 DIAGNOSIS — D2271 Melanocytic nevi of right lower limb, including hip: Secondary | ICD-10-CM | POA: Diagnosis not present

## 2018-09-13 DIAGNOSIS — D225 Melanocytic nevi of trunk: Secondary | ICD-10-CM | POA: Diagnosis not present

## 2018-09-13 NOTE — Telephone Encounter (Signed)
Pt cld to be scheduled for the high risk clinic. An appt has been scheduled for the pt to see Dr. Burr Medico on 12/2 at 230pm.

## 2018-09-19 ENCOUNTER — Encounter: Payer: Self-pay | Admitting: Licensed Clinical Social Worker

## 2018-09-19 DIAGNOSIS — Z1501 Genetic susceptibility to malignant neoplasm of breast: Secondary | ICD-10-CM | POA: Insufficient documentation

## 2018-09-19 DIAGNOSIS — Z1509 Genetic susceptibility to other malignant neoplasm: Secondary | ICD-10-CM

## 2018-09-19 DIAGNOSIS — Z1502 Genetic susceptibility to malignant neoplasm of ovary: Secondary | ICD-10-CM

## 2018-09-19 DIAGNOSIS — Z1589 Genetic susceptibility to other disease: Secondary | ICD-10-CM

## 2018-09-28 DIAGNOSIS — R1111 Vomiting without nausea: Secondary | ICD-10-CM | POA: Diagnosis not present

## 2018-09-28 DIAGNOSIS — K219 Gastro-esophageal reflux disease without esophagitis: Secondary | ICD-10-CM | POA: Diagnosis not present

## 2018-10-03 DIAGNOSIS — Z7689 Persons encountering health services in other specified circumstances: Secondary | ICD-10-CM | POA: Diagnosis not present

## 2018-10-07 NOTE — Progress Notes (Addendum)
Kimberly Stanley  [phone] Fax:([phone]) [place] Note   Patient Care Team: Harlan Stains, MD as PCP - General (Family Medicine) Kennith Center, RD as Dietitian (Family Medicine) 10/08/2018   Referring Physician: Dr. 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 female is here because of newly found CHEK2 mutation. Today, she is here with her husband.  She was referred to genetic counseling due to personal history of cutaneous leiomyoma and family history of breast cancer in her mother.   Her cutaneous leiomyoma was on her right wrist which was removed. 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  She has no history of malignancy. She had a perforated ulcer in the past. She sees a psychiatrist for her depression and mood swings, and her conditions are controlled with medications.   She quit drinking in 2013. She quit smoking in 2014. She is a Agricultural engineer.  Menarche at 23. She had endometrial ablation due to menorrhagia a few years ago. No symptoms of menopause like hot flashes. She has 3 children that were breast fed. She was 4 at age of first pregnancy.    Mother was diagnosed with breast cancer age 53. One paternal uncle was diagnosed with pancreatic cancer.   Pertinent positives and negatives of review of systems are listed and detailed within the above HPI.   MEDICAL HISTORY:  Past Medical History:  Diagnosis Date  . Cutaneous leiomyoma   . Depression   . Eating disorder    loose alot of weight since bypass  . Family history of breast cancer   . Family history of pancreatic cancer   . Hypertension   . Hypothyroidism   . Thyroid disease   . Wears contact lenses     SURGICAL HISTORY: Past Surgical History:  Procedure Laterality Date  . CHOLECYSTECTOMY    . DILATION AND CURETTAGE OF UTERUS    . GASTRIC ROUX-EN-Y  N/A 10/01/2013   Procedure: LAPAROSCOPIC ROUX-EN-Y GASTRIC BYPASS with hiatal hernia repair, WITH UPPER ENDOSCOPY;  Surgeon: Madilyn Hook, DO;  Location: [facility];  Service: General;  Laterality: N/A;  . LAPAROSCOPY N/A 09/23/2015   Procedure: LAPAROSCOPY DIAGNOSTIC placement of drain;  Surgeon: Johnathan Hausen, MD;  Location: [facility];  Service: General;  Laterality: N/A;  . SUPERFICIAL PERONEAL NERVE RELEASE Left 10/24/2014   Procedure: SUPERFICIAL PERONEAL NERVE RELEASE LEFT;  Surgeon: Alta Corning, MD;  Location: [facility];  Service: Orthopedics;  Laterality: Left;  . TUBAL LIGATION    . UPPER GI ENDOSCOPY N/A 09/23/2015   Procedure: UPPER GI ENDOSCOPY;  Surgeon: Johnathan Hausen, MD;  Location: [facility];  Service: General;  Laterality: N/A;    SOCIAL HISTORY: Social History   Socioeconomic History  . Marital status: Married    Spouse name: Not on file  . Number of children: Not on file  . Years of education: Not on file  . Highest education level: Not on file  Occupational History  . Not on file  Social Needs  . Financial resource strain: Not on file  . Food insecurity:    Worry: Not on file    Inability: Not on file  . Transportation needs:    Medical: Not on file    Non-medical: Not on file  Tobacco Use  . Smoking status: Former Smoker    Packs/day: 0.50    Years: 15.00  Pack years: 7.50    Types: Cigarettes    Last attempt to quit: 07/05/2013    Years since quitting: 5.2  . Smokeless tobacco: Never Used  Substance and Sexual Activity  . Alcohol use: No    Alcohol/week: 20.0 standard drinks    Types: 10 Shots of liquor, 10 Standard drinks or equivalent per week  . Drug use: Yes    Types: "Crack" cocaine    Comment: MAY 2013 LAST DRUG USE  . Sexual activity: Not on file    Comment: Novasure  Lifestyle  . Physical activity:    Days per week: Not on file    Minutes per session: Not on file  . Stress: Not on file  Relationships  . Social connections:     Talks on phone: Not on file    Gets together: Not on file    Attends religious service: Not on file    Active member of club or organization: Not on file    Attends meetings of clubs or organizations: Not on file    Relationship status: Not on file  . Intimate partner violence:    Fear of current or ex partner: Not on file    Emotionally abused: Not on file    Physically abused: Not on file    Forced sexual activity: Not on file  Other Topics Concern  . Not on file  Social History Narrative  . Not on file    FAMILY HISTORY: Family History  Problem Relation Age of Onset  . Breast cancer Mother 76       recurrence, metastatic, d 59  . Pancreatic cancer Paternal Uncle 49    ALLERGIES:  is allergic to nsaids.  MEDICATIONS:  Current Outpatient Medications  Medication Sig Dispense Refill  . acetaminophen (TYLENOL) 500 MG tablet Take 1,000 mg by mouth every 6 (six) hours as needed for mild pain.    Marland Kitchen ALPRAZolam (XANAX) 1 MG tablet Take 1 mg by mouth daily as needed for anxiety. 0-4 X day, as needed.    Marland Kitchen amphetamine-dextroamphetamine (ADDERALL) 20 MG tablet Take 20 mg by mouth daily. Takes 1-4 X day, as needed.    Marland Kitchen BIOTIN PO Take by mouth.    . busPIRone (BUSPAR) 15 MG tablet Take 30 mg by mouth 2 (two) times daily.     . Cholecalciferol (VITAMIN D) 2000 units CAPS Take 4,000 Units by mouth.    Marland Kitchen FLUoxetine (PROZAC) 20 MG tablet Take 60 mg by mouth daily.   1  . furosemide (LASIX) 40 MG tablet Take 20 mg by mouth. Taking 40 mg in the AM; 20 mg at bedtime.    . hydrOXYzine (ATARAX/VISTARIL) 50 MG tablet Take 50 mg by mouth 3 (three) times daily as needed.    . lamoTRIgine (LAMICTAL) 100 MG tablet Take 150 mg by mouth 2 (two) times daily.     . Levothyroxine Sodium (TIROSINT) 112 MCG CAPS Take 1 tablet daily in am (Patient taking differently: Take 1 capsule by mouth every morning. Take 1 tablet daily in am) 60 capsule 1  . MELOXICAM PO Take by mouth.    . pantoprazole (PROTONIX)  40 MG tablet Take 1 tablet (40 mg total) by mouth 2 (two) times daily. (Patient taking differently: Take 40 mg by mouth once. ) 90 tablet 0  . sucralfate (CARAFATE) 1 GM/10ML suspension Take 1 g by mouth as needed.     . traZODone (DESYREL) 50 MG tablet Take 200 mg by mouth at bedtime.  No current facility-administered medications for this visit.     REVIEW OF SYSTEMS:   Constitutional: Denies fevers, chills or abnormal night sweats Eyes: Denies blurriness of vision, double vision or watery eyes Ears, nose, mouth, throat, and face: Denies mucositis or sore throat Respiratory: Denies cough, dyspnea or wheezes Cardiovascular: Denies palpitation, chest discomfort or lower extremity swelling Gastrointestinal:  Denies nausea, heartburn or change in bowel habits Skin: Denies abnormal skin rashes (+)  S/p removal of cutaneous leiomyoma on right wrist  Lymphatics: Denies new lymphadenopathy or easy bruising Neurological:Denies numbness, tingling or new weaknesses Behavioral/Psych: Mood is stable, no new changes  All other systems were reviewed with the patient and are negative.  PHYSICAL EXAMINATION: ECOG PERFORMANCE STATUS: 0 - Asymptomatic  Vitals:   10/08/18 1440  BP: 122/76  Pulse: 80  Resp: 18  Temp: 97.6 F (36.4 C)  SpO2: 100%   Filed Weights   10/08/18 1440  Weight: 171 lb 4.8 oz (77.7 kg)    GENERAL:alert, no distress and comfortable SKIN: skin color, texture, turgor are normal, no rashes or significant lesions EYES: normal, conjunctiva are pink and non-injected, sclera clear OROPHARYNX:no exudate, no erythema and lips, buccal mucosa, and tongue normal  NECK: supple, thyroid normal size, non-tender, without nodularity LYMPH:  no palpable lymphadenopathy in the cervical, axillary or inguinal LUNGS: clear to auscultation and percussion with normal breathing effort HEART: regular rate & rhythm and no murmurs and no lower extremity edema ABDOMEN:abdomen soft, non-tender  and normal bowel sounds Musculoskeletal:no cyanosis of digits and no clubbing  PSYCH: alert & oriented x 3 with fluent speech NEURO: no focal motor/sensory deficits Breast: No palpable masses, changes to skin or nipples.  LABORATORY DATA:  I have reviewed the data as listed CBC Latest Ref Rng & Units 09/27/2015 09/25/2015 09/24/2015  WBC 4.0 - 10.5 K/uL 5.3 7.0 10.1  Hemoglobin 12.0 - 15.0 g/dL 11.7(L) 10.0(L) 10.5(L)  Hematocrit 36.0 - 46.0 % 33.9(L) 30.4(L) 31.5(L)  Platelets 150 - 400 K/uL 220 166 184   CMP Latest Ref Rng & Units 09/27/2015 09/25/2015 09/24/2015  Glucose 65 - 99 mg/dL 127(H) 94 98  BUN 6 - 20 mg/dL <5(L) 7 5(L)  Creatinine 0.44 - 1.00 mg/dL 0.64 0.57 0.53  Sodium 135 - 145 mmol/L 139 139 137  Potassium 3.5 - 5.1 mmol/L 4.0 3.9 3.9  Chloride 101 - 111 mmol/L 107 105 105  CO2 22 - 32 mmol/L '27 28 27  ' Calcium 8.9 - 10.3 mg/dL 8.4(L) 8.5(L) 8.4(L)  Total Protein 6.5 - 8.1 g/dL - - -  Total Bilirubin 0.3 - 1.2 mg/dL - - -  Alkaline Phos 38 - 126 U/L - - -  AST 15 - 41 U/L - - -  ALT 14 - 54 U/L - - -     RADIOGRAPHIC STUDIES: I have personally reviewed the radiological images as listed and agreed with the findings in the report. No results found.  ASSESSMENT & PLAN:  TYREANNA BISESI is a 52 y.o. female with history of cutaneous leiomyoma and family history of breast cancer   1. 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 Dema Severin referred her to genetic counseling due to family history of cancer and personal history of cutaneous leiomyoma. -I reviewed her genetic testing results -We discussed that CHEK2 mutation is associated with 2-4 times high risk of breast cancer than general population, especially in people with strong family history of breast cancer, such as her case.  Her estimated lifetime risk of  breast cancer is probably around 30-48% -According to NCCN guidelines, annual screening breast MRI in addition to mammogram is recommended for early  breast cancer detection.  For woman, it is recommended to start mammogram and breast MRI at age of 76 or early based on family history.  She is 7, and has been compliant with annual mammogram. She is agreeable with annual breast MRI also, I recommend her to MRI at 6 months after mammogram. -I also recommend self breast exam, and physician breast exam twice a year. I educated her to monitor herself for warning symptoms including nipple discharge or inversion, skin changes, or palpable lumps.  -We also discussed the other risk for breast cancer, such as late pregnancy, or never pregnant, obesity, postmenopausal hormonal replacement, etc. I educated her about importance of maintaining a healthy diet, exercising, and losing weight. She had breast-fed her children.   -I discussed chemoprevention with Tamoxifen. I do not recommend starting Tamoxifen due to her genetic related high risk, which we do not have sufficient data.  -We also discussed the increased risk of colon cancer from CHEK2 mutation. -She will have a colonoscopy soon with her gastroenterologist.  -We also discussed slightly increased risk of thyroid cancer and prostate cancer in female CHEK2 carrier but data is insufficient -I encourage her children to be screened for CHEK2 mutation   -She will f/u with Dr. Dema Severin for breast exams and mammograms   2. 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 There were no deleterious mutations in: AIP, ALK, APC, ATM, AXIN2,BAP1,  BARD1, BLM, BMPR1A, BRCA1, BRCA2, BRIP1, CASR, CDC73, CDH1, CDK4, CDKN1B, CDKN1C, CDKN2A (p14ARF), CDKN2A (p16INK4a), CEBPA, CTNNA1, DICER1, DIS3L2, EGFR (c.2369C>T, p.Thr790Met variant only), EPCAM (Deletion/duplication testing only), FH, FLCN, GATA2, GPC3, GREM1 (Promoter region deletion/duplication testing only), HOXB13  (c.251G>A, p.Gly84Glu), HRAS, KIT, MAX, MEN1, MET, MITF (c.952G>A, p.Glu318Lys variant only), MLH1, MSH2, MSH3, MSH6, MUTYH, NBN, NF1, NF2, NTHL1, PALB2, PDGFRA, PHOX2B, PMS2, POLD1, POLE, POT1, PRKAR1A, PTCH1, PTEN, RAD50, RAD51C, RAD51D, RB1, RECQL4, RET, RUNX1, SDHAF2, SDHA (sequence changes only), SDHB, SDHC, SDHD, SMAD4, SMARCA4, SMARCB1, SMARCE1, STK11, SUFU, TERC, TERT, TMEM127, TP53, TSC1, TSC2, VHL, WRN and WT1.   3. Depression, mood swings -She is on Xanax daily -She follows up with a psychiatrist    Plan  -f/u with Dr. Dema Severin for breast cancer screening, including annual mammogram and breast MRI (6 months apart) -f/u open and as needed -Breast MRI in 06/2019 ordered -she will have screening colonoscopy soon.     No orders of the defined types were placed in this encounter.   All questions were answered. The patient knows to call the clinic with any problems, questions or concerns. I spent 35 minutes counseling the patient face to face. The total time spent in the appointment was 45 minutes and more than 50% was on counseling.  Dierdre Searles Dweik am acting as scribe for Dr. Truitt Merle.  I have reviewed the above documentation for accuracy and completeness, and I agree with the above.      Truitt Merle, MD 10/08/2018

## 2018-10-08 ENCOUNTER — Inpatient Hospital Stay: Payer: 59 | Attending: Genetic Counselor | Admitting: Hematology

## 2018-10-08 ENCOUNTER — Other Ambulatory Visit: Payer: Self-pay | Admitting: Family Medicine

## 2018-10-08 ENCOUNTER — Encounter: Payer: Self-pay | Admitting: Hematology

## 2018-10-08 VITALS — BP 122/76 | HR 80 | Temp 97.6°F | Resp 18 | Ht 65.0 in | Wt 171.3 lb

## 2018-10-08 DIAGNOSIS — Z79899 Other long term (current) drug therapy: Secondary | ICD-10-CM | POA: Diagnosis not present

## 2018-10-08 DIAGNOSIS — Z803 Family history of malignant neoplasm of breast: Secondary | ICD-10-CM | POA: Insufficient documentation

## 2018-10-08 DIAGNOSIS — Z87891 Personal history of nicotine dependence: Secondary | ICD-10-CM

## 2018-10-08 DIAGNOSIS — Z1589 Genetic susceptibility to other disease: Secondary | ICD-10-CM

## 2018-10-08 DIAGNOSIS — Z9884 Bariatric surgery status: Secondary | ICD-10-CM | POA: Diagnosis not present

## 2018-10-08 DIAGNOSIS — Z1501 Genetic susceptibility to malignant neoplasm of breast: Secondary | ICD-10-CM | POA: Diagnosis present

## 2018-10-08 DIAGNOSIS — Z800 Family history of malignant neoplasm of digestive organs: Secondary | ICD-10-CM | POA: Insufficient documentation

## 2018-10-08 DIAGNOSIS — Z1231 Encounter for screening mammogram for malignant neoplasm of breast: Secondary | ICD-10-CM

## 2018-10-08 DIAGNOSIS — F329 Major depressive disorder, single episode, unspecified: Secondary | ICD-10-CM | POA: Diagnosis not present

## 2018-10-08 DIAGNOSIS — Z1502 Genetic susceptibility to malignant neoplasm of ovary: Secondary | ICD-10-CM

## 2018-10-08 DIAGNOSIS — Z1509 Genetic susceptibility to other malignant neoplasm: Secondary | ICD-10-CM

## 2018-10-09 ENCOUNTER — Encounter: Payer: Self-pay | Admitting: Hematology

## 2018-10-18 DIAGNOSIS — Z0100 Encounter for examination of eyes and vision without abnormal findings: Secondary | ICD-10-CM | POA: Diagnosis not present

## 2018-10-23 DIAGNOSIS — Z1211 Encounter for screening for malignant neoplasm of colon: Secondary | ICD-10-CM | POA: Diagnosis not present

## 2019-01-02 ENCOUNTER — Ambulatory Visit
Admission: RE | Admit: 2019-01-02 | Discharge: 2019-01-02 | Disposition: A | Discharge status: Home / Self Care | Payer: 59 | Source: Ambulatory Visit | Attending: Family Medicine | Admitting: Family Medicine

## 2019-01-02 DIAGNOSIS — Z1231 Encounter for screening mammogram for malignant neoplasm of breast: Secondary | ICD-10-CM

## 2019-01-03 ENCOUNTER — Other Ambulatory Visit: Payer: Self-pay | Admitting: Family Medicine

## 2019-01-03 DIAGNOSIS — R928 Other abnormal and inconclusive findings on diagnostic imaging of breast: Secondary | ICD-10-CM

## 2019-01-08 DIAGNOSIS — F902 Attention-deficit hyperactivity disorder, combined type: Secondary | ICD-10-CM | POA: Diagnosis not present

## 2019-01-08 DIAGNOSIS — F331 Major depressive disorder, recurrent, moderate: Secondary | ICD-10-CM | POA: Diagnosis not present

## 2019-01-09 ENCOUNTER — Ambulatory Visit
Admission: RE | Admit: 2019-01-09 | Discharge: 2019-01-09 | Disposition: A | Discharge status: Home / Self Care | Payer: BLUE CROSS/BLUE SHIELD | Source: Ambulatory Visit | Attending: Family Medicine | Admitting: Family Medicine

## 2019-01-09 ENCOUNTER — Ambulatory Visit: Admission: RE | Admit: 2019-01-09 | Payer: 59 | Source: Ambulatory Visit

## 2019-01-09 ENCOUNTER — Other Ambulatory Visit: Payer: Self-pay | Admitting: Family Medicine

## 2019-01-09 DIAGNOSIS — R928 Other abnormal and inconclusive findings on diagnostic imaging of breast: Secondary | ICD-10-CM

## 2019-01-09 DIAGNOSIS — R921 Mammographic calcification found on diagnostic imaging of breast: Secondary | ICD-10-CM | POA: Diagnosis not present

## 2019-01-15 ENCOUNTER — Other Ambulatory Visit: Payer: Self-pay | Admitting: Family Medicine

## 2019-01-15 DIAGNOSIS — R921 Mammographic calcification found on diagnostic imaging of breast: Secondary | ICD-10-CM

## 2019-01-15 DIAGNOSIS — F331 Major depressive disorder, recurrent, moderate: Secondary | ICD-10-CM | POA: Diagnosis not present

## 2019-01-15 DIAGNOSIS — F902 Attention-deficit hyperactivity disorder, combined type: Secondary | ICD-10-CM | POA: Diagnosis not present

## 2019-01-29 DIAGNOSIS — F3181 Bipolar II disorder: Secondary | ICD-10-CM | POA: Diagnosis not present

## 2019-01-29 DIAGNOSIS — F900 Attention-deficit hyperactivity disorder, predominantly inattentive type: Secondary | ICD-10-CM | POA: Diagnosis not present

## 2019-02-28 DIAGNOSIS — F902 Attention-deficit hyperactivity disorder, combined type: Secondary | ICD-10-CM | POA: Diagnosis not present

## 2019-02-28 DIAGNOSIS — F331 Major depressive disorder, recurrent, moderate: Secondary | ICD-10-CM | POA: Diagnosis not present

## 2019-03-14 DIAGNOSIS — F331 Major depressive disorder, recurrent, moderate: Secondary | ICD-10-CM | POA: Diagnosis not present

## 2019-03-14 DIAGNOSIS — F902 Attention-deficit hyperactivity disorder, combined type: Secondary | ICD-10-CM | POA: Diagnosis not present

## 2019-03-29 DIAGNOSIS — F331 Major depressive disorder, recurrent, moderate: Secondary | ICD-10-CM | POA: Diagnosis not present

## 2019-03-29 DIAGNOSIS — F902 Attention-deficit hyperactivity disorder, combined type: Secondary | ICD-10-CM | POA: Diagnosis not present

## 2019-04-19 DIAGNOSIS — F902 Attention-deficit hyperactivity disorder, combined type: Secondary | ICD-10-CM | POA: Diagnosis not present

## 2019-04-19 DIAGNOSIS — F331 Major depressive disorder, recurrent, moderate: Secondary | ICD-10-CM | POA: Diagnosis not present

## 2019-05-09 DIAGNOSIS — F331 Major depressive disorder, recurrent, moderate: Secondary | ICD-10-CM | POA: Diagnosis not present

## 2019-05-09 DIAGNOSIS — F902 Attention-deficit hyperactivity disorder, combined type: Secondary | ICD-10-CM | POA: Diagnosis not present

## 2019-05-14 ENCOUNTER — Other Ambulatory Visit: Payer: Self-pay | Admitting: Family Medicine

## 2019-05-14 ENCOUNTER — Telehealth: Payer: Self-pay | Admitting: *Deleted

## 2019-05-14 ENCOUNTER — Other Ambulatory Visit (HOSPITAL_COMMUNITY)
Admission: RE | Admit: 2019-05-14 | Discharge: 2019-05-14 | Disposition: A | Discharge status: Home / Self Care | Payer: BC Managed Care – PPO | Source: Ambulatory Visit | Attending: Family Medicine | Admitting: Family Medicine

## 2019-05-14 DIAGNOSIS — E039 Hypothyroidism, unspecified: Secondary | ICD-10-CM | POA: Diagnosis not present

## 2019-05-14 DIAGNOSIS — Z124 Encounter for screening for malignant neoplasm of cervix: Secondary | ICD-10-CM | POA: Diagnosis not present

## 2019-05-14 DIAGNOSIS — E669 Obesity, unspecified: Secondary | ICD-10-CM | POA: Diagnosis not present

## 2019-05-14 DIAGNOSIS — E559 Vitamin D deficiency, unspecified: Secondary | ICD-10-CM | POA: Diagnosis not present

## 2019-05-14 DIAGNOSIS — M542 Cervicalgia: Secondary | ICD-10-CM | POA: Diagnosis not present

## 2019-05-14 DIAGNOSIS — Z9884 Bariatric surgery status: Secondary | ICD-10-CM | POA: Diagnosis not present

## 2019-05-14 DIAGNOSIS — Z0000 Encounter for general adult medical examination without abnormal findings: Secondary | ICD-10-CM | POA: Diagnosis not present

## 2019-05-14 NOTE — Telephone Encounter (Signed)
Received vm call from pt stating that she had a mammogram in March & has one scheduled in Sept & has an order for MRI in Aug.  She is asking if she should have both or on or the other.  She states she is confused.  Reviewed chart & pt reports mammogram is a repeat b/c something was seen on last one.  Informed that she probably does need both but to f/u with [facility] radiology for clarification.  Message routed to Dr Burr Medico for input.

## 2019-05-15 NOTE — Telephone Encounter (Signed)
I spoke with pt and recommend her do breast MRI this month and keep mammogram in Sep. She agrees.  Darlena, could you get her MRI precert? Malachy Mood, please let GI know when it's approved and go ahead to schedule it.  Thanks   Truitt Merle MD

## 2019-05-17 LAB — CYTOLOGY - PAP
Diagnosis: NEGATIVE
HPV: NOT DETECTED

## 2019-05-23 DIAGNOSIS — F902 Attention-deficit hyperactivity disorder, combined type: Secondary | ICD-10-CM | POA: Diagnosis not present

## 2019-05-23 DIAGNOSIS — F331 Major depressive disorder, recurrent, moderate: Secondary | ICD-10-CM | POA: Diagnosis not present

## 2019-06-06 DIAGNOSIS — F902 Attention-deficit hyperactivity disorder, combined type: Secondary | ICD-10-CM | POA: Diagnosis not present

## 2019-06-06 DIAGNOSIS — F331 Major depressive disorder, recurrent, moderate: Secondary | ICD-10-CM | POA: Diagnosis not present

## 2019-06-07 DIAGNOSIS — Z9884 Bariatric surgery status: Secondary | ICD-10-CM | POA: Diagnosis not present

## 2019-06-13 DIAGNOSIS — D509 Iron deficiency anemia, unspecified: Secondary | ICD-10-CM | POA: Diagnosis not present

## 2019-06-20 DIAGNOSIS — F902 Attention-deficit hyperactivity disorder, combined type: Secondary | ICD-10-CM | POA: Diagnosis not present

## 2019-06-20 DIAGNOSIS — F331 Major depressive disorder, recurrent, moderate: Secondary | ICD-10-CM | POA: Diagnosis not present

## 2019-07-03 ENCOUNTER — Other Ambulatory Visit: Payer: Self-pay

## 2019-07-03 ENCOUNTER — Ambulatory Visit
Admission: RE | Admit: 2019-07-03 | Discharge: 2019-07-03 | Disposition: A | Discharge status: Home / Self Care | Payer: BC Managed Care – PPO | Source: Ambulatory Visit | Attending: Hematology | Admitting: Hematology

## 2019-07-03 ENCOUNTER — Other Ambulatory Visit: Payer: Self-pay | Admitting: Hematology

## 2019-07-03 DIAGNOSIS — Z1501 Genetic susceptibility to malignant neoplasm of breast: Secondary | ICD-10-CM | POA: Diagnosis not present

## 2019-07-03 DIAGNOSIS — Z1509 Genetic susceptibility to other malignant neoplasm: Secondary | ICD-10-CM

## 2019-07-03 MED ORDER — GADOBUTROL 1 MMOL/ML IV SOLN
8.0000 mL | Freq: Once | INTRAVENOUS | Status: AC | PRN
  Administered 2019-07-03: 8 mL via INTRAVENOUS

## 2019-07-04 DIAGNOSIS — F331 Major depressive disorder, recurrent, moderate: Secondary | ICD-10-CM | POA: Diagnosis not present

## 2019-07-04 DIAGNOSIS — F902 Attention-deficit hyperactivity disorder, combined type: Secondary | ICD-10-CM | POA: Diagnosis not present

## 2019-07-16 ENCOUNTER — Other Ambulatory Visit: Payer: Self-pay | Admitting: Family Medicine

## 2019-07-16 ENCOUNTER — Other Ambulatory Visit: Payer: Self-pay

## 2019-07-16 ENCOUNTER — Ambulatory Visit
Admission: RE | Admit: 2019-07-16 | Discharge: 2019-07-16 | Disposition: A | Discharge status: Home / Self Care | Payer: BLUE CROSS/BLUE SHIELD | Source: Ambulatory Visit | Attending: Family Medicine | Admitting: Family Medicine

## 2019-07-16 DIAGNOSIS — R921 Mammographic calcification found on diagnostic imaging of breast: Secondary | ICD-10-CM

## 2019-07-16 DIAGNOSIS — R920 Mammographic microcalcification found on diagnostic imaging of breast: Secondary | ICD-10-CM | POA: Diagnosis not present

## 2019-07-16 DIAGNOSIS — R922 Inconclusive mammogram: Secondary | ICD-10-CM | POA: Diagnosis not present

## 2019-07-19 DIAGNOSIS — F331 Major depressive disorder, recurrent, moderate: Secondary | ICD-10-CM | POA: Diagnosis not present

## 2019-07-19 DIAGNOSIS — F902 Attention-deficit hyperactivity disorder, combined type: Secondary | ICD-10-CM | POA: Diagnosis not present

## 2019-07-23 DIAGNOSIS — F900 Attention-deficit hyperactivity disorder, predominantly inattentive type: Secondary | ICD-10-CM | POA: Diagnosis not present

## 2019-07-23 DIAGNOSIS — F3342 Major depressive disorder, recurrent, in full remission: Secondary | ICD-10-CM | POA: Diagnosis not present

## 2019-07-23 DIAGNOSIS — F410 Panic disorder [episodic paroxysmal anxiety] without agoraphobia: Secondary | ICD-10-CM | POA: Diagnosis not present

## 2019-07-25 ENCOUNTER — Encounter: Payer: Self-pay | Admitting: Hematology

## 2019-08-01 DIAGNOSIS — F331 Major depressive disorder, recurrent, moderate: Secondary | ICD-10-CM | POA: Diagnosis not present

## 2019-08-01 DIAGNOSIS — F902 Attention-deficit hyperactivity disorder, combined type: Secondary | ICD-10-CM | POA: Diagnosis not present

## 2019-08-22 DIAGNOSIS — F331 Major depressive disorder, recurrent, moderate: Secondary | ICD-10-CM | POA: Diagnosis not present

## 2019-08-22 DIAGNOSIS — F902 Attention-deficit hyperactivity disorder, combined type: Secondary | ICD-10-CM | POA: Diagnosis not present

## 2019-09-05 DIAGNOSIS — F902 Attention-deficit hyperactivity disorder, combined type: Secondary | ICD-10-CM | POA: Diagnosis not present

## 2019-09-05 DIAGNOSIS — F331 Major depressive disorder, recurrent, moderate: Secondary | ICD-10-CM | POA: Diagnosis not present

## 2019-09-19 DIAGNOSIS — F902 Attention-deficit hyperactivity disorder, combined type: Secondary | ICD-10-CM | POA: Diagnosis not present

## 2019-09-19 DIAGNOSIS — F331 Major depressive disorder, recurrent, moderate: Secondary | ICD-10-CM | POA: Diagnosis not present

## 2019-09-27 DIAGNOSIS — D509 Iron deficiency anemia, unspecified: Secondary | ICD-10-CM | POA: Diagnosis not present

## 2019-10-10 DIAGNOSIS — F902 Attention-deficit hyperactivity disorder, combined type: Secondary | ICD-10-CM | POA: Diagnosis not present

## 2019-10-10 DIAGNOSIS — F331 Major depressive disorder, recurrent, moderate: Secondary | ICD-10-CM | POA: Diagnosis not present

## 2019-10-24 DIAGNOSIS — H9193 Unspecified hearing loss, bilateral: Secondary | ICD-10-CM | POA: Diagnosis not present

## 2019-10-24 DIAGNOSIS — F902 Attention-deficit hyperactivity disorder, combined type: Secondary | ICD-10-CM | POA: Diagnosis not present

## 2019-10-24 DIAGNOSIS — F331 Major depressive disorder, recurrent, moderate: Secondary | ICD-10-CM | POA: Diagnosis not present

## 2019-11-14 DIAGNOSIS — F331 Major depressive disorder, recurrent, moderate: Secondary | ICD-10-CM | POA: Diagnosis not present

## 2019-11-14 DIAGNOSIS — F902 Attention-deficit hyperactivity disorder, combined type: Secondary | ICD-10-CM | POA: Diagnosis not present

## 2019-11-28 DIAGNOSIS — F902 Attention-deficit hyperactivity disorder, combined type: Secondary | ICD-10-CM | POA: Diagnosis not present

## 2019-11-28 DIAGNOSIS — F331 Major depressive disorder, recurrent, moderate: Secondary | ICD-10-CM | POA: Diagnosis not present

## 2019-12-12 DIAGNOSIS — F902 Attention-deficit hyperactivity disorder, combined type: Secondary | ICD-10-CM | POA: Diagnosis not present

## 2019-12-12 DIAGNOSIS — F331 Major depressive disorder, recurrent, moderate: Secondary | ICD-10-CM | POA: Diagnosis not present

## 2019-12-26 DIAGNOSIS — F331 Major depressive disorder, recurrent, moderate: Secondary | ICD-10-CM | POA: Diagnosis not present

## 2019-12-26 DIAGNOSIS — F902 Attention-deficit hyperactivity disorder, combined type: Secondary | ICD-10-CM | POA: Diagnosis not present

## 2020-01-10 DIAGNOSIS — F331 Major depressive disorder, recurrent, moderate: Secondary | ICD-10-CM | POA: Diagnosis not present

## 2020-01-10 DIAGNOSIS — F902 Attention-deficit hyperactivity disorder, combined type: Secondary | ICD-10-CM | POA: Diagnosis not present

## 2020-01-14 DIAGNOSIS — F410 Panic disorder [episodic paroxysmal anxiety] without agoraphobia: Secondary | ICD-10-CM | POA: Diagnosis not present

## 2020-01-14 DIAGNOSIS — F3342 Major depressive disorder, recurrent, in full remission: Secondary | ICD-10-CM | POA: Diagnosis not present

## 2020-01-14 DIAGNOSIS — F900 Attention-deficit hyperactivity disorder, predominantly inattentive type: Secondary | ICD-10-CM | POA: Diagnosis not present

## 2020-01-17 ENCOUNTER — Other Ambulatory Visit: Payer: Self-pay | Admitting: Family Medicine

## 2020-01-17 ENCOUNTER — Ambulatory Visit
Admission: RE | Admit: 2020-01-17 | Discharge: 2020-01-17 | Disposition: A | Discharge status: Home / Self Care | Payer: BC Managed Care – PPO | Source: Ambulatory Visit | Attending: Family Medicine | Admitting: Family Medicine

## 2020-01-17 ENCOUNTER — Other Ambulatory Visit: Payer: Self-pay

## 2020-01-17 DIAGNOSIS — R921 Mammographic calcification found on diagnostic imaging of breast: Secondary | ICD-10-CM | POA: Diagnosis not present

## 2020-01-21 ENCOUNTER — Other Ambulatory Visit: Payer: Self-pay

## 2020-01-21 ENCOUNTER — Ambulatory Visit
Admission: RE | Admit: 2020-01-21 | Discharge: 2020-01-21 | Disposition: A | Discharge status: Home / Self Care | Payer: BC Managed Care – PPO | Source: Ambulatory Visit | Attending: Family Medicine | Admitting: Family Medicine

## 2020-01-21 DIAGNOSIS — R921 Mammographic calcification found on diagnostic imaging of breast: Secondary | ICD-10-CM

## 2020-01-21 DIAGNOSIS — N6011 Diffuse cystic mastopathy of right breast: Secondary | ICD-10-CM | POA: Diagnosis not present

## 2020-01-23 DIAGNOSIS — F902 Attention-deficit hyperactivity disorder, combined type: Secondary | ICD-10-CM | POA: Diagnosis not present

## 2020-01-23 DIAGNOSIS — F331 Major depressive disorder, recurrent, moderate: Secondary | ICD-10-CM | POA: Diagnosis not present

## 2020-02-14 DIAGNOSIS — F902 Attention-deficit hyperactivity disorder, combined type: Secondary | ICD-10-CM | POA: Diagnosis not present

## 2020-02-14 DIAGNOSIS — F331 Major depressive disorder, recurrent, moderate: Secondary | ICD-10-CM | POA: Diagnosis not present

## 2020-02-27 DIAGNOSIS — F331 Major depressive disorder, recurrent, moderate: Secondary | ICD-10-CM | POA: Diagnosis not present

## 2020-02-27 DIAGNOSIS — F902 Attention-deficit hyperactivity disorder, combined type: Secondary | ICD-10-CM | POA: Diagnosis not present

## 2020-03-26 DIAGNOSIS — F902 Attention-deficit hyperactivity disorder, combined type: Secondary | ICD-10-CM | POA: Diagnosis not present

## 2020-03-26 DIAGNOSIS — F331 Major depressive disorder, recurrent, moderate: Secondary | ICD-10-CM | POA: Diagnosis not present

## 2020-04-09 DIAGNOSIS — F331 Major depressive disorder, recurrent, moderate: Secondary | ICD-10-CM | POA: Diagnosis not present

## 2020-04-09 DIAGNOSIS — F902 Attention-deficit hyperactivity disorder, combined type: Secondary | ICD-10-CM | POA: Diagnosis not present

## 2020-04-23 DIAGNOSIS — F902 Attention-deficit hyperactivity disorder, combined type: Secondary | ICD-10-CM | POA: Diagnosis not present

## 2020-04-23 DIAGNOSIS — F331 Major depressive disorder, recurrent, moderate: Secondary | ICD-10-CM | POA: Diagnosis not present

## 2020-05-06 DIAGNOSIS — F902 Attention-deficit hyperactivity disorder, combined type: Secondary | ICD-10-CM | POA: Diagnosis not present

## 2020-05-06 DIAGNOSIS — F331 Major depressive disorder, recurrent, moderate: Secondary | ICD-10-CM | POA: Diagnosis not present

## 2020-05-15 DIAGNOSIS — Z1501 Genetic susceptibility to malignant neoplasm of breast: Secondary | ICD-10-CM | POA: Diagnosis not present

## 2020-05-15 DIAGNOSIS — E039 Hypothyroidism, unspecified: Secondary | ICD-10-CM | POA: Diagnosis not present

## 2020-05-15 DIAGNOSIS — E559 Vitamin D deficiency, unspecified: Secondary | ICD-10-CM | POA: Diagnosis not present

## 2020-05-15 DIAGNOSIS — Z9884 Bariatric surgery status: Secondary | ICD-10-CM | POA: Diagnosis not present

## 2020-05-15 DIAGNOSIS — Z13220 Encounter for screening for lipoid disorders: Secondary | ICD-10-CM | POA: Diagnosis not present

## 2020-05-15 DIAGNOSIS — Z0000 Encounter for general adult medical examination without abnormal findings: Secondary | ICD-10-CM | POA: Diagnosis not present

## 2020-05-18 ENCOUNTER — Other Ambulatory Visit: Payer: Self-pay | Admitting: Family Medicine

## 2020-05-19 ENCOUNTER — Other Ambulatory Visit: Payer: Self-pay | Admitting: Family Medicine

## 2020-05-19 DIAGNOSIS — Z1501 Genetic susceptibility to malignant neoplasm of breast: Secondary | ICD-10-CM

## 2020-05-21 DIAGNOSIS — F902 Attention-deficit hyperactivity disorder, combined type: Secondary | ICD-10-CM | POA: Diagnosis not present

## 2020-05-21 DIAGNOSIS — F331 Major depressive disorder, recurrent, moderate: Secondary | ICD-10-CM | POA: Diagnosis not present

## 2020-05-29 ENCOUNTER — Other Ambulatory Visit: Payer: Self-pay

## 2020-05-29 ENCOUNTER — Ambulatory Visit
Admission: RE | Admit: 2020-05-29 | Discharge: 2020-05-29 | Disposition: A | Discharge status: Home / Self Care | Payer: BC Managed Care – PPO | Source: Ambulatory Visit | Attending: Family Medicine | Admitting: Family Medicine

## 2020-05-29 DIAGNOSIS — Z1501 Genetic susceptibility to malignant neoplasm of breast: Secondary | ICD-10-CM

## 2020-05-29 DIAGNOSIS — Z803 Family history of malignant neoplasm of breast: Secondary | ICD-10-CM | POA: Diagnosis not present

## 2020-05-29 DIAGNOSIS — C50911 Malignant neoplasm of unspecified site of right female breast: Secondary | ICD-10-CM | POA: Diagnosis not present

## 2020-05-29 DIAGNOSIS — C50912 Malignant neoplasm of unspecified site of left female breast: Secondary | ICD-10-CM | POA: Diagnosis not present

## 2020-05-29 MED ORDER — GADOBUTROL 1 MMOL/ML IV SOLN
9.0000 mL | Freq: Once | INTRAVENOUS | Status: AC | PRN
  Administered 2020-05-29: 9 mL via INTRAVENOUS

## 2020-06-04 DIAGNOSIS — F902 Attention-deficit hyperactivity disorder, combined type: Secondary | ICD-10-CM | POA: Diagnosis not present

## 2020-06-04 DIAGNOSIS — F331 Major depressive disorder, recurrent, moderate: Secondary | ICD-10-CM | POA: Diagnosis not present

## 2020-06-18 DIAGNOSIS — F902 Attention-deficit hyperactivity disorder, combined type: Secondary | ICD-10-CM | POA: Diagnosis not present

## 2020-06-18 DIAGNOSIS — F331 Major depressive disorder, recurrent, moderate: Secondary | ICD-10-CM | POA: Diagnosis not present

## 2020-07-02 DIAGNOSIS — F331 Major depressive disorder, recurrent, moderate: Secondary | ICD-10-CM | POA: Diagnosis not present

## 2020-07-02 DIAGNOSIS — F902 Attention-deficit hyperactivity disorder, combined type: Secondary | ICD-10-CM | POA: Diagnosis not present

## 2020-07-07 DIAGNOSIS — E039 Hypothyroidism, unspecified: Secondary | ICD-10-CM | POA: Diagnosis not present

## 2020-07-09 DIAGNOSIS — F410 Panic disorder [episodic paroxysmal anxiety] without agoraphobia: Secondary | ICD-10-CM | POA: Diagnosis not present

## 2020-07-09 DIAGNOSIS — F3342 Major depressive disorder, recurrent, in full remission: Secondary | ICD-10-CM | POA: Diagnosis not present

## 2020-07-16 DIAGNOSIS — F902 Attention-deficit hyperactivity disorder, combined type: Secondary | ICD-10-CM | POA: Diagnosis not present

## 2020-07-16 DIAGNOSIS — F331 Major depressive disorder, recurrent, moderate: Secondary | ICD-10-CM | POA: Diagnosis not present

## 2020-08-05 DIAGNOSIS — F902 Attention-deficit hyperactivity disorder, combined type: Secondary | ICD-10-CM | POA: Diagnosis not present

## 2020-08-05 DIAGNOSIS — F331 Major depressive disorder, recurrent, moderate: Secondary | ICD-10-CM | POA: Diagnosis not present

## 2020-08-20 DIAGNOSIS — F902 Attention-deficit hyperactivity disorder, combined type: Secondary | ICD-10-CM | POA: Diagnosis not present

## 2020-08-20 DIAGNOSIS — F331 Major depressive disorder, recurrent, moderate: Secondary | ICD-10-CM | POA: Diagnosis not present

## 2020-08-25 DIAGNOSIS — E039 Hypothyroidism, unspecified: Secondary | ICD-10-CM | POA: Diagnosis not present

## 2020-08-27 DIAGNOSIS — R635 Abnormal weight gain: Secondary | ICD-10-CM | POA: Diagnosis not present

## 2020-09-03 DIAGNOSIS — F331 Major depressive disorder, recurrent, moderate: Secondary | ICD-10-CM | POA: Diagnosis not present

## 2020-09-03 DIAGNOSIS — F902 Attention-deficit hyperactivity disorder, combined type: Secondary | ICD-10-CM | POA: Diagnosis not present

## 2020-09-10 DIAGNOSIS — F902 Attention-deficit hyperactivity disorder, combined type: Secondary | ICD-10-CM | POA: Diagnosis not present

## 2020-09-10 DIAGNOSIS — F331 Major depressive disorder, recurrent, moderate: Secondary | ICD-10-CM | POA: Diagnosis not present

## 2020-09-23 DIAGNOSIS — F902 Attention-deficit hyperactivity disorder, combined type: Secondary | ICD-10-CM | POA: Diagnosis not present

## 2020-09-23 DIAGNOSIS — F331 Major depressive disorder, recurrent, moderate: Secondary | ICD-10-CM | POA: Diagnosis not present

## 2020-10-08 DIAGNOSIS — F331 Major depressive disorder, recurrent, moderate: Secondary | ICD-10-CM | POA: Diagnosis not present

## 2020-10-08 DIAGNOSIS — F902 Attention-deficit hyperactivity disorder, combined type: Secondary | ICD-10-CM | POA: Diagnosis not present

## 2020-10-22 DIAGNOSIS — F331 Major depressive disorder, recurrent, moderate: Secondary | ICD-10-CM | POA: Diagnosis not present

## 2020-10-22 DIAGNOSIS — F902 Attention-deficit hyperactivity disorder, combined type: Secondary | ICD-10-CM | POA: Diagnosis not present

## 2020-10-23 DIAGNOSIS — M545 Low back pain, unspecified: Secondary | ICD-10-CM | POA: Diagnosis not present

## 2020-10-23 DIAGNOSIS — M5441 Lumbago with sciatica, right side: Secondary | ICD-10-CM | POA: Diagnosis not present

## 2020-11-11 ENCOUNTER — Emergency Department (HOSPITAL_COMMUNITY): Payer: BC Managed Care – PPO | Admitting: Anesthesiology

## 2020-11-11 ENCOUNTER — Emergency Department (HOSPITAL_COMMUNITY): Payer: BC Managed Care – PPO

## 2020-11-11 ENCOUNTER — Inpatient Hospital Stay (HOSPITAL_COMMUNITY): Payer: BC Managed Care – PPO

## 2020-11-11 ENCOUNTER — Encounter (HOSPITAL_COMMUNITY): Admission: EM | Disposition: A | Discharge status: Home Health | Payer: Self-pay | Source: Home / Self Care

## 2020-11-11 ENCOUNTER — Encounter (HOSPITAL_COMMUNITY): Payer: Self-pay

## 2020-11-11 ENCOUNTER — Inpatient Hospital Stay (HOSPITAL_COMMUNITY)
Admission: EM | Admit: 2020-11-11 | Discharge: 2020-11-19 | DRG: 326 | Disposition: A | Discharge status: Home Health | Payer: BC Managed Care – PPO | Attending: Surgery | Admitting: Surgery

## 2020-11-11 ENCOUNTER — Other Ambulatory Visit: Payer: Self-pay

## 2020-11-11 DIAGNOSIS — D62 Acute posthemorrhagic anemia: Secondary | ICD-10-CM | POA: Diagnosis not present

## 2020-11-11 DIAGNOSIS — Z8659 Personal history of other mental and behavioral disorders: Secondary | ICD-10-CM | POA: Diagnosis not present

## 2020-11-11 DIAGNOSIS — Z79890 Hormone replacement therapy (postmenopausal): Secondary | ICD-10-CM

## 2020-11-11 DIAGNOSIS — R188 Other ascites: Secondary | ICD-10-CM | POA: Diagnosis not present

## 2020-11-11 DIAGNOSIS — E039 Hypothyroidism, unspecified: Secondary | ICD-10-CM | POA: Diagnosis present

## 2020-11-11 DIAGNOSIS — D509 Iron deficiency anemia, unspecified: Secondary | ICD-10-CM | POA: Diagnosis present

## 2020-11-11 DIAGNOSIS — F3181 Bipolar II disorder: Secondary | ICD-10-CM | POA: Diagnosis not present

## 2020-11-11 DIAGNOSIS — Z79899 Other long term (current) drug therapy: Secondary | ICD-10-CM

## 2020-11-11 DIAGNOSIS — J9811 Atelectasis: Secondary | ICD-10-CM | POA: Diagnosis not present

## 2020-11-11 DIAGNOSIS — A6000 Herpesviral infection of urogenital system, unspecified: Secondary | ICD-10-CM | POA: Insufficient documentation

## 2020-11-11 DIAGNOSIS — Z1501 Genetic susceptibility to malignant neoplasm of breast: Secondary | ICD-10-CM

## 2020-11-11 DIAGNOSIS — E559 Vitamin D deficiency, unspecified: Secondary | ICD-10-CM | POA: Diagnosis not present

## 2020-11-11 DIAGNOSIS — I959 Hypotension, unspecified: Secondary | ICD-10-CM | POA: Diagnosis not present

## 2020-11-11 DIAGNOSIS — K251 Acute gastric ulcer with perforation: Secondary | ICD-10-CM | POA: Diagnosis not present

## 2020-11-11 DIAGNOSIS — Z4682 Encounter for fitting and adjustment of non-vascular catheter: Secondary | ICD-10-CM | POA: Diagnosis not present

## 2020-11-11 DIAGNOSIS — K255 Chronic or unspecified gastric ulcer with perforation: Secondary | ICD-10-CM

## 2020-11-11 DIAGNOSIS — Z9884 Bariatric surgery status: Secondary | ICD-10-CM | POA: Diagnosis not present

## 2020-11-11 DIAGNOSIS — K631 Perforation of intestine (nontraumatic): Secondary | ICD-10-CM | POA: Diagnosis not present

## 2020-11-11 DIAGNOSIS — I10 Essential (primary) hypertension: Secondary | ICD-10-CM | POA: Diagnosis present

## 2020-11-11 DIAGNOSIS — K668 Other specified disorders of peritoneum: Secondary | ICD-10-CM | POA: Diagnosis not present

## 2020-11-11 DIAGNOSIS — Z886 Allergy status to analgesic agent status: Secondary | ICD-10-CM

## 2020-11-11 DIAGNOSIS — F1010 Alcohol abuse, uncomplicated: Secondary | ICD-10-CM | POA: Insufficient documentation

## 2020-11-11 DIAGNOSIS — R1310 Dysphagia, unspecified: Secondary | ICD-10-CM | POA: Diagnosis present

## 2020-11-11 DIAGNOSIS — F410 Panic disorder [episodic paroxysmal anxiety] without agoraphobia: Secondary | ICD-10-CM | POA: Diagnosis not present

## 2020-11-11 DIAGNOSIS — J9601 Acute respiratory failure with hypoxia: Secondary | ICD-10-CM

## 2020-11-11 DIAGNOSIS — R109 Unspecified abdominal pain: Secondary | ICD-10-CM | POA: Diagnosis not present

## 2020-11-11 DIAGNOSIS — F32A Depression, unspecified: Secondary | ICD-10-CM | POA: Diagnosis present

## 2020-11-11 DIAGNOSIS — F418 Other specified anxiety disorders: Secondary | ICD-10-CM | POA: Diagnosis not present

## 2020-11-11 DIAGNOSIS — K219 Gastro-esophageal reflux disease without esophagitis: Secondary | ICD-10-CM | POA: Diagnosis present

## 2020-11-11 DIAGNOSIS — Z87891 Personal history of nicotine dependence: Secondary | ICD-10-CM | POA: Diagnosis not present

## 2020-11-11 DIAGNOSIS — E876 Hypokalemia: Secondary | ICD-10-CM

## 2020-11-11 DIAGNOSIS — K659 Peritonitis, unspecified: Secondary | ICD-10-CM | POA: Diagnosis not present

## 2020-11-11 DIAGNOSIS — K285 Chronic or unspecified gastrojejunal ulcer with perforation: Principal | ICD-10-CM | POA: Diagnosis present

## 2020-11-11 DIAGNOSIS — Z87898 Personal history of other specified conditions: Secondary | ICD-10-CM | POA: Insufficient documentation

## 2020-11-11 DIAGNOSIS — R52 Pain, unspecified: Secondary | ICD-10-CM | POA: Diagnosis not present

## 2020-11-11 DIAGNOSIS — U071 COVID-19: Secondary | ICD-10-CM

## 2020-11-11 DIAGNOSIS — F509 Eating disorder, unspecified: Secondary | ICD-10-CM | POA: Insufficient documentation

## 2020-11-11 DIAGNOSIS — R1084 Generalized abdominal pain: Secondary | ICD-10-CM | POA: Diagnosis not present

## 2020-11-11 DIAGNOSIS — Z4659 Encounter for fitting and adjustment of other gastrointestinal appliance and device: Secondary | ICD-10-CM

## 2020-11-11 DIAGNOSIS — R609 Edema, unspecified: Secondary | ICD-10-CM | POA: Insufficient documentation

## 2020-11-11 DIAGNOSIS — K279 Peptic ulcer, site unspecified, unspecified as acute or chronic, without hemorrhage or perforation: Secondary | ICD-10-CM | POA: Insufficient documentation

## 2020-11-11 DIAGNOSIS — R1319 Other dysphagia: Secondary | ICD-10-CM | POA: Insufficient documentation

## 2020-11-11 DIAGNOSIS — Z791 Long term (current) use of non-steroidal anti-inflammatories (NSAID): Secondary | ICD-10-CM

## 2020-11-11 DIAGNOSIS — R0902 Hypoxemia: Secondary | ICD-10-CM | POA: Diagnosis not present

## 2020-11-11 DIAGNOSIS — F419 Anxiety disorder, unspecified: Secondary | ICD-10-CM | POA: Diagnosis present

## 2020-11-11 DIAGNOSIS — J1282 Pneumonia due to coronavirus disease 2019: Secondary | ICD-10-CM | POA: Diagnosis present

## 2020-11-11 HISTORY — PX: LAPAROSCOPY: SHX197

## 2020-11-11 HISTORY — PX: LAPAROSCOPIC LYSIS OF ADHESIONS: SHX5905

## 2020-11-11 LAB — CBC WITH DIFFERENTIAL/PLATELET
Abs Immature Granulocytes: 0.01 10*3/uL (ref 0.00–0.07)
Basophils Absolute: 0 10*3/uL (ref 0.0–0.1)
Basophils Relative: 0 %
Eosinophils Absolute: 0.1 10*3/uL (ref 0.0–0.5)
Eosinophils Relative: 1 %
HCT: 42.1 % (ref 36.0–46.0)
Hemoglobin: 14.5 g/dL (ref 12.0–15.0)
Immature Granulocytes: 0 %
Lymphocytes Relative: 19 %
Lymphs Abs: 1.1 10*3/uL (ref 0.7–4.0)
MCH: 32.6 pg (ref 26.0–34.0)
MCHC: 34.4 g/dL (ref 30.0–36.0)
MCV: 94.6 fL (ref 80.0–100.0)
Monocytes Absolute: 0.3 10*3/uL (ref 0.1–1.0)
Monocytes Relative: 5 %
Neutro Abs: 4.6 10*3/uL (ref 1.7–7.7)
Neutrophils Relative %: 75 %
Platelets: 218 10*3/uL (ref 150–400)
RBC: 4.45 MIL/uL (ref 3.87–5.11)
RDW: 11.9 % (ref 11.5–15.5)
WBC: 6.1 10*3/uL (ref 4.0–10.5)
nRBC: 0 % (ref 0.0–0.2)

## 2020-11-11 LAB — RESP PANEL BY RT-PCR (FLU A&B, COVID) ARPGX2
Influenza A by PCR: NEGATIVE
Influenza B by PCR: NEGATIVE
SARS Coronavirus 2 by RT PCR: POSITIVE — AB

## 2020-11-11 LAB — LIPASE, BLOOD: Lipase: 24 U/L (ref 11–51)

## 2020-11-11 LAB — COMPREHENSIVE METABOLIC PANEL
ALT: 28 U/L (ref 0–44)
AST: 26 U/L (ref 15–41)
Albumin: 3.8 g/dL (ref 3.5–5.0)
Alkaline Phosphatase: 65 U/L (ref 38–126)
Anion gap: 9 (ref 5–15)
BUN: 11 mg/dL (ref 6–20)
CO2: 24 mmol/L (ref 22–32)
Calcium: 8.6 mg/dL — ABNORMAL LOW (ref 8.9–10.3)
Chloride: 104 mmol/L (ref 98–111)
Creatinine, Ser: 0.73 mg/dL (ref 0.44–1.00)
GFR, Estimated: >60 mL/min (ref >60)
Glucose, Bld: 102 mg/dL — ABNORMAL HIGH (ref 70–99)
Potassium: 3.4 mmol/L — ABNORMAL LOW (ref 3.5–5.1)
Sodium: 137 mmol/L (ref 135–145)
Total Bilirubin: 0.5 mg/dL (ref 0.3–1.2)
Total Protein: 6.5 g/dL (ref 6.5–8.1)

## 2020-11-11 LAB — MAGNESIUM: Magnesium: 1.9 mg/dL (ref 1.7–2.4)

## 2020-11-11 LAB — I-STAT BETA HCG BLOOD, ED (MC, WL, AP ONLY): I-stat hCG, quantitative: <5 m[IU]/mL (ref <5)

## 2020-11-11 LAB — LACTIC ACID, PLASMA: Lactic Acid, Venous: 1.1 mmol/L (ref 0.5–1.9)

## 2020-11-11 SURGERY — LAPAROSCOPY, DIAGNOSTIC
Anesthesia: General | Site: Abdomen

## 2020-11-11 MED ORDER — FENTANYL CITRATE (PF) 100 MCG/2ML IJ SOLN
50.0000 ug | INTRAMUSCULAR | Status: DC | PRN
  Administered 2020-11-11: 50 ug via INTRAVENOUS
  Filled 2020-11-11: qty 2

## 2020-11-11 MED ORDER — FENTANYL CITRATE (PF) 250 MCG/5ML IJ SOLN
INTRAMUSCULAR | Status: DC | PRN
  Administered 2020-11-11 (×3): 50 ug via INTRAVENOUS
  Administered 2020-11-11: 100 ug via INTRAVENOUS

## 2020-11-11 MED ORDER — DEXAMETHASONE SODIUM PHOSPHATE 10 MG/ML IJ SOLN
INTRAMUSCULAR | Status: DC | PRN
  Administered 2020-11-11: 8 mg via INTRAVENOUS

## 2020-11-11 MED ORDER — LACTATED RINGERS IV SOLN: INTRAVENOUS | Status: DC

## 2020-11-11 MED ORDER — HYDROMORPHONE HCL 1 MG/ML IJ SOLN
0.5000 mg | INTRAMUSCULAR | Status: DC | PRN
  Administered 2020-11-11 – 2020-11-12 (×6): 1 mg via INTRAVENOUS
  Filled 2020-11-11: qty 1
  Filled 2020-11-11: qty 2
  Filled 2020-11-11 (×2): qty 1

## 2020-11-11 MED ORDER — 0.9 % SODIUM CHLORIDE (POUR BTL) OPTIME
TOPICAL | Status: DC | PRN
  Administered 2020-11-11: 1000 mL

## 2020-11-11 MED ORDER — MIDAZOLAM HCL 5 MG/5ML IJ SOLN
INTRAMUSCULAR | Status: DC | PRN
  Administered 2020-11-11: 2 mg via INTRAVENOUS

## 2020-11-11 MED ORDER — DIPHENHYDRAMINE HCL 50 MG/ML IJ SOLN
12.5000 mg | Freq: Four times a day (QID) | INTRAMUSCULAR | Status: DC | PRN
  Administered 2020-11-12 – 2020-11-17 (×2): 25 mg via INTRAVENOUS
  Administered 2020-11-17: 12.5 mg via INTRAVENOUS
  Filled 2020-11-11 (×3): qty 1

## 2020-11-11 MED ORDER — IOHEXOL 300 MG/ML  SOLN
100.0000 mL | Freq: Once | INTRAMUSCULAR | Status: AC | PRN
  Administered 2020-11-11: 100 mL via INTRAVENOUS

## 2020-11-11 MED ORDER — SODIUM CHLORIDE 0.9 % IV SOLN
1000.0000 mL | Freq: Three times a day (TID) | INTRAVENOUS | Status: DC | PRN
  Administered 2020-11-12: 1000 mL via INTRAVENOUS

## 2020-11-11 MED ORDER — ZINC SULFATE 220 (50 ZN) MG PO CAPS: 220.0000 mg | ORAL_CAPSULE | Freq: Every day | ORAL | Status: DC

## 2020-11-11 MED ORDER — BISACODYL 10 MG RE SUPP: 10.0000 mg | Freq: Two times a day (BID) | RECTAL | Status: DC | PRN

## 2020-11-11 MED ORDER — DEXAMETHASONE SODIUM PHOSPHATE 10 MG/ML IJ SOLN
INTRAMUSCULAR | Status: AC
  Filled 2020-11-11: qty 1

## 2020-11-11 MED ORDER — SODIUM CHLORIDE 0.9 % IV SOLN
100.0000 mg | Freq: Every day | INTRAVENOUS | Status: AC
  Administered 2020-11-12 – 2020-11-15 (×4): 100 mg via INTRAVENOUS
  Filled 2020-11-11 (×5): qty 20

## 2020-11-11 MED ORDER — HYDROCOD POLST-CPM POLST ER 10-8 MG/5ML PO SUER: 5.0000 mL | Freq: Two times a day (BID) | ORAL | Status: DC | PRN

## 2020-11-11 MED ORDER — SODIUM CHLORIDE 0.9 % IV SOLN
8.0000 mg/h | INTRAVENOUS | Status: AC
  Administered 2020-11-11 – 2020-11-14 (×6): 8 mg/h via INTRAVENOUS
  Filled 2020-11-11 (×9): qty 80

## 2020-11-11 MED ORDER — PHENYLEPHRINE HCL-NACL 10-0.9 MG/250ML-% IV SOLN
INTRAVENOUS | Status: DC | PRN
  Administered 2020-11-11: 40 ug/min via INTRAVENOUS

## 2020-11-11 MED ORDER — BUPIVACAINE LIPOSOME 1.3 % IJ SUSP
INTRAMUSCULAR | Status: DC | PRN
  Administered 2020-11-11: 20 mL

## 2020-11-11 MED ORDER — PIPERACILLIN-TAZOBACTAM 3.375 G IVPB
3.3750 g | Freq: Three times a day (TID) | INTRAVENOUS | Status: AC
  Administered 2020-11-11 – 2020-11-16 (×14): 3.375 g via INTRAVENOUS
  Filled 2020-11-11 (×15): qty 50

## 2020-11-11 MED ORDER — CHLORHEXIDINE GLUCONATE CLOTH 2 % EX PADS
6.0000 | MEDICATED_PAD | Freq: Every day | CUTANEOUS | Status: DC
  Administered 2020-11-12 – 2020-11-19 (×8): 6 via TOPICAL

## 2020-11-11 MED ORDER — MORPHINE SULFATE (PF) 4 MG/ML IV SOLN
4.0000 mg | Freq: Once | INTRAVENOUS | Status: AC
  Administered 2020-11-11: 4 mg via INTRAVENOUS
  Filled 2020-11-11: qty 1

## 2020-11-11 MED ORDER — SUGAMMADEX SODIUM 200 MG/2ML IV SOLN
INTRAVENOUS | Status: DC | PRN
  Administered 2020-11-11: 200 mg via INTRAVENOUS

## 2020-11-11 MED ORDER — ONDANSETRON HCL 4 MG/2ML IJ SOLN
INTRAMUSCULAR | Status: AC
  Filled 2020-11-11: qty 2

## 2020-11-11 MED ORDER — ONDANSETRON HCL 4 MG/2ML IJ SOLN
4.0000 mg | Freq: Once | INTRAMUSCULAR | Status: AC
  Administered 2020-11-11: 4 mg via INTRAVENOUS
  Filled 2020-11-11: qty 2

## 2020-11-11 MED ORDER — SUCCINYLCHOLINE CHLORIDE 200 MG/10ML IV SOSY
PREFILLED_SYRINGE | INTRAVENOUS | Status: AC
  Filled 2020-11-11: qty 10

## 2020-11-11 MED ORDER — SODIUM CHLORIDE 0.9% FLUSH
3.0000 mL | Freq: Two times a day (BID) | INTRAVENOUS | Status: DC
  Administered 2020-11-12 – 2020-11-19 (×15): 3 mL via INTRAVENOUS

## 2020-11-11 MED ORDER — SUCCINYLCHOLINE CHLORIDE 200 MG/10ML IV SOSY
PREFILLED_SYRINGE | INTRAVENOUS | Status: DC | PRN
  Administered 2020-11-11: 120 mg via INTRAVENOUS

## 2020-11-11 MED ORDER — BUPIVACAINE LIPOSOME 1.3 % IJ SUSP
20.0000 mL | INTRAMUSCULAR | Status: AC
  Filled 2020-11-11: qty 20

## 2020-11-11 MED ORDER — ENOXAPARIN SODIUM 40 MG/0.4ML ~~LOC~~ SOLN
40.0000 mg | SUBCUTANEOUS | Status: DC
  Administered 2020-11-11: 40 mg via SUBCUTANEOUS
  Filled 2020-11-11: qty 0.4

## 2020-11-11 MED ORDER — SODIUM CHLORIDE 0.9 % IV SOLN
80.0000 mg | Freq: Once | INTRAVENOUS | Status: AC
  Administered 2020-11-11: 15:00:00 80 mg via INTRAVENOUS
  Filled 2020-11-11: qty 80

## 2020-11-11 MED ORDER — METOPROLOL TARTRATE 5 MG/5ML IV SOLN: 5.0000 mg | Freq: Four times a day (QID) | INTRAVENOUS | Status: DC | PRN

## 2020-11-11 MED ORDER — ASCORBIC ACID 500 MG PO TABS: 500.0000 mg | ORAL_TABLET | Freq: Every day | ORAL | Status: DC

## 2020-11-11 MED ORDER — LIP MEDEX EX OINT
1.0000 "application " | TOPICAL_OINTMENT | Freq: Two times a day (BID) | CUTANEOUS | Status: DC
  Administered 2020-11-11 – 2020-11-18 (×14): 1 via TOPICAL
  Filled 2020-11-11 (×3): qty 7

## 2020-11-11 MED ORDER — LACTATED RINGERS IR SOLN
Status: DC | PRN
  Administered 2020-11-11: 3000 mL

## 2020-11-11 MED ORDER — PANTOPRAZOLE SODIUM 40 MG IV SOLR
40.0000 mg | Freq: Two times a day (BID) | INTRAVENOUS | Status: DC
  Administered 2020-11-15 – 2020-11-19 (×9): 40 mg via INTRAVENOUS
  Filled 2020-11-11 (×9): qty 40

## 2020-11-11 MED ORDER — GUAIFENESIN-DM 100-10 MG/5ML PO SYRP: 10.0000 mL | ORAL_SOLUTION | ORAL | Status: DC | PRN

## 2020-11-11 MED ORDER — HYDROMORPHONE HCL 1 MG/ML IJ SOLN
0.5000 mg | Freq: Once | INTRAMUSCULAR | Status: AC
  Administered 2020-11-11: 0.5 mg via INTRAVENOUS
  Filled 2020-11-11: qty 1

## 2020-11-11 MED ORDER — PROPOFOL 10 MG/ML IV BOLUS
INTRAVENOUS | Status: AC
  Filled 2020-11-11: qty 20

## 2020-11-11 MED ORDER — FENTANYL CITRATE (PF) 250 MCG/5ML IJ SOLN
INTRAMUSCULAR | Status: AC
  Filled 2020-11-11: qty 5

## 2020-11-11 MED ORDER — ONDANSETRON HCL 4 MG/2ML IJ SOLN
INTRAMUSCULAR | Status: DC | PRN
  Administered 2020-11-11: 4 mg via INTRAVENOUS

## 2020-11-11 MED ORDER — LACTATED RINGERS IV SOLN: INTRAVENOUS | Status: DC | PRN

## 2020-11-11 MED ORDER — PIPERACILLIN-TAZOBACTAM 3.375 G IVPB
3.3750 g | Freq: Once | INTRAVENOUS | Status: AC
  Administered 2020-11-11: 3.375 g via INTRAVENOUS
  Filled 2020-11-11: qty 50

## 2020-11-11 MED ORDER — SODIUM CHLORIDE 0.9 % IV SOLN
200.0000 mg | Freq: Once | INTRAVENOUS | Status: AC
  Administered 2020-11-11: 200 mg via INTRAVENOUS
  Filled 2020-11-11: qty 200

## 2020-11-11 MED ORDER — MAGIC MOUTHWASH
15.0000 mL | Freq: Four times a day (QID) | ORAL | Status: DC | PRN
  Filled 2020-11-11: qty 15

## 2020-11-11 MED ORDER — LIDOCAINE HCL (PF) 2 % IJ SOLN
INTRAMUSCULAR | Status: AC
  Filled 2020-11-11: qty 5

## 2020-11-11 MED ORDER — CHLORHEXIDINE GLUCONATE 0.12 % MT SOLN
15.0000 mL | Freq: Two times a day (BID) | OROMUCOSAL | Status: DC
  Administered 2020-11-11 – 2020-11-19 (×13): 15 mL via OROMUCOSAL
  Filled 2020-11-11 (×13): qty 15

## 2020-11-11 MED ORDER — POTASSIUM CHLORIDE 10 MEQ/100ML IV SOLN
10.0000 meq | INTRAVENOUS | Status: AC
  Administered 2020-11-11 (×2): 10 meq via INTRAVENOUS
  Filled 2020-11-11 (×2): qty 100

## 2020-11-11 MED ORDER — MIDAZOLAM HCL 2 MG/2ML IJ SOLN
INTRAMUSCULAR | Status: AC
  Filled 2020-11-11: qty 2

## 2020-11-11 MED ORDER — HYDROMORPHONE HCL 1 MG/ML IJ SOLN
0.5000 mg | INTRAMUSCULAR | Status: DC | PRN
  Administered 2020-11-11 (×2): 0.5 mg via INTRAVENOUS
  Filled 2020-11-11 (×3): qty 1

## 2020-11-11 MED ORDER — BUPIVACAINE-EPINEPHRINE (PF) 0.25% -1:200000 IJ SOLN
INTRAMUSCULAR | Status: AC
  Filled 2020-11-11: qty 30

## 2020-11-11 MED ORDER — PROPOFOL 10 MG/ML IV BOLUS
INTRAVENOUS | Status: DC | PRN
  Administered 2020-11-11: 140 mg via INTRAVENOUS

## 2020-11-11 MED ORDER — BUPIVACAINE-EPINEPHRINE 0.25% -1:200000 IJ SOLN
INTRAMUSCULAR | Status: DC | PRN
  Administered 2020-11-11: 30 mL

## 2020-11-11 MED ORDER — SODIUM CHLORIDE 0.9 % IV SOLN
INTRAVENOUS | Status: DC | PRN
  Administered 2020-11-11 – 2020-11-12 (×3): 250 mL via INTRAVENOUS

## 2020-11-11 MED ORDER — ROCURONIUM BROMIDE 10 MG/ML (PF) SYRINGE
PREFILLED_SYRINGE | INTRAVENOUS | Status: DC | PRN
  Administered 2020-11-11: 10 mg via INTRAVENOUS
  Administered 2020-11-11: 50 mg via INTRAVENOUS
  Administered 2020-11-11: 10 mg via INTRAVENOUS

## 2020-11-11 MED ORDER — ORAL CARE MOUTH RINSE
15.0000 mL | Freq: Two times a day (BID) | OROMUCOSAL | Status: DC
  Administered 2020-11-12 – 2020-11-18 (×10): 15 mL via OROMUCOSAL

## 2020-11-11 MED ORDER — LIDOCAINE 2% (20 MG/ML) 5 ML SYRINGE
INTRAMUSCULAR | Status: DC | PRN
  Administered 2020-11-11: 60 mg via INTRAVENOUS

## 2020-11-11 MED ORDER — SIMETHICONE 80 MG PO CHEW: 40.0000 mg | CHEWABLE_TABLET | Freq: Four times a day (QID) | ORAL | Status: DC | PRN

## 2020-11-11 MED ORDER — ROCURONIUM BROMIDE 10 MG/ML (PF) SYRINGE
PREFILLED_SYRINGE | INTRAVENOUS | Status: AC
  Filled 2020-11-11: qty 10

## 2020-11-11 MED ORDER — METHOCARBAMOL 1000 MG/10ML IJ SOLN
1000.0000 mg | Freq: Four times a day (QID) | INTRAVENOUS | Status: DC | PRN
  Administered 2020-11-12: 1000 mg via INTRAVENOUS
  Filled 2020-11-11: qty 10
  Filled 2020-11-11: qty 1000

## 2020-11-11 SURGICAL SUPPLY — 50 items
CABLE HIGH FREQUENCY MONO STRZ (ELECTRODE) ×3 IMPLANT
COVER SURGICAL LIGHT HANDLE (MISCELLANEOUS) ×3 IMPLANT
DECANTER SPIKE VIAL GLASS SM (MISCELLANEOUS) ×3 IMPLANT
DRAIN CHANNEL 19F RND (DRAIN) ×1 IMPLANT
DRAPE UTILITY XL STRL (DRAPES) ×2 IMPLANT
DRAPE WARM FLUID 44X44 (DRAPES) ×3 IMPLANT
DRSG TEGADERM 2-3/8X2-3/4 SM (GAUZE/BANDAGES/DRESSINGS) ×4 IMPLANT
DRSG TEGADERM 4X4.75 (GAUZE/BANDAGES/DRESSINGS) ×4 IMPLANT
ELECT REM PT RETURN 15FT ADLT (MISCELLANEOUS) ×3 IMPLANT
EVACUATOR SILICONE 100CC (DRAIN) ×1 IMPLANT
GAUZE SPONGE 2X2 8PLY STRL LF (GAUZE/BANDAGES/DRESSINGS) ×2 IMPLANT
GLOVE BIO SURGEON STRL SZ7.5 (GLOVE) ×2 IMPLANT
GLOVE BIO SURGEON STRL SZ8 (GLOVE) ×1 IMPLANT
GLOVE BIOGEL PI IND STRL 7.5 (GLOVE) ×4 IMPLANT
GLOVE BIOGEL PI IND STRL 8 (GLOVE) IMPLANT
GLOVE BIOGEL PI INDICATOR 7.5 (GLOVE) ×4
GLOVE BIOGEL PI INDICATOR 8 (GLOVE) ×1
GLOVE ECLIPSE 8.0 STRL XLNG CF (GLOVE) ×3 IMPLANT
GLOVE INDICATOR 8.0 STRL GRN (GLOVE) ×5 IMPLANT
GLOVE SURG ENC MOIS LTX SZ8 (GLOVE) ×1 IMPLANT
GOWN STRL REUS W/ TWL XL LVL3 (GOWN DISPOSABLE) ×4 IMPLANT
GOWN STRL REUS W/TWL XL LVL3 (GOWN DISPOSABLE) ×21 IMPLANT
IRRIG SUCT STRYKERFLOW 2 WTIP (MISCELLANEOUS) ×3
IRRIGATION SUCT STRKRFLW 2 WTP (MISCELLANEOUS) ×2 IMPLANT
KIT BASIN OR (CUSTOM PROCEDURE TRAY) ×3 IMPLANT
KIT TURNOVER KIT A (KITS) ×1 IMPLANT
PAD POSITIONING PINK XL (MISCELLANEOUS) ×3 IMPLANT
PROTECTOR NERVE ULNAR (MISCELLANEOUS) ×2 IMPLANT
SCISSORS LAP 5X35 DISP (ENDOMECHANICALS) ×3 IMPLANT
SEALER TISSUE G2 STRG ARTC 35C (ENDOMECHANICALS) IMPLANT
SET TUBE SMOKE EVAC HIGH FLOW (TUBING) ×3 IMPLANT
SHEARS HARMONIC ACE PLUS 36CM (ENDOMECHANICALS) ×1 IMPLANT
SLEEVE XCEL OPT CAN 5 100 (ENDOMECHANICALS) ×8 IMPLANT
SPONGE GAUZE 2X2 8PLY STRL LF (GAUZE/BANDAGES/DRESSINGS) ×2 IMPLANT
SPONGE GAUZE 2X2 STER 10/PKG (GAUZE/BANDAGES/DRESSINGS)
SUT MNCRL AB 4-0 PS2 18 (SUTURE) ×4 IMPLANT
SUT PROLENE 2 0 SH DA (SUTURE) ×2 IMPLANT
SUT SILK 2 0 (SUTURE) ×3
SUT SILK 2 0 SH CR/8 (SUTURE) ×1 IMPLANT
SUT SILK 2-0 18XBRD TIE 12 (SUTURE) ×2 IMPLANT
SUT SILK 3 0 (SUTURE) ×3
SUT SILK 3 0 SH CR/8 (SUTURE) ×1 IMPLANT
SUT SILK 3-0 18XBRD TIE 12 (SUTURE) ×2 IMPLANT
SUT VLOC 180 2-0 6IN GS21 (SUTURE) ×4 IMPLANT
TOWEL OR 17X26 10 PK STRL BLUE (TOWEL DISPOSABLE) ×3 IMPLANT
TOWEL OR NON WOVEN STRL DISP B (DISPOSABLE) ×3 IMPLANT
TRAY FOLEY MTR SLVR 16FR STAT (SET/KITS/TRAYS/PACK) ×1 IMPLANT
TRAY LAPAROSCOPIC (CUSTOM PROCEDURE TRAY) ×3 IMPLANT
TROCAR BLADELESS OPT 5 100 (ENDOMECHANICALS) ×3 IMPLANT
TROCAR XCEL NON-BLD 11X100MML (ENDOMECHANICALS) ×1 IMPLANT

## 2020-11-11 NOTE — ED Triage Notes (Signed)
Pt BIB EMS. Pt complaining of abd pain that started at 0900. Pt has hx of perforated bowel; pt stated it feels like the same pain. EMS gave 100 fent.    BP-110/72

## 2020-11-11 NOTE — ED Provider Notes (Signed)
[facility] DEPT Provider Note   CSN: [id number] Arrival date & time: 11/11/20  1053     History Chief Complaint  Patient presents with  . Abdominal Pain    Kimberly Stanley is a 55 y.o. female.  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  Patient states that the pain radiates into her shoulders.  She had a small bowel movement this morning that was nonbloody.  No urinary symptoms reported.  No chest pain or shortness of breath.  Patient was given 100 mcg of fentanyl in route.  States that symptoms feel similar to previous bowel perforation.  Patient also notes several days of cough, sore throat body aches.  She states that she was planning to get a Covid test prior to symptoms beginning today.        Past Medical History:  Diagnosis Date  . Cutaneous leiomyoma   . Depression   . Eating disorder    loose alot of weight since bypass  . Family history of breast cancer   . Family history of pancreatic cancer   . Hypertension   . Hypothyroidism   . Thyroid disease   . Wears contact lenses     Patient Active Problem List   Diagnosis Date Noted  . Monoallelic mutation of CHEK2 gene in female patient 09/19/2018  . Genetic testing 08/23/2018  . Family history of breast cancer   . Family history of pancreatic cancer   . Cutaneous leiomyoma   . Intra-abdominal free air of unknown etiology 09/23/2015  . 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 Preseptal cellulitis 12/20/2011  . Leukocytosis 12/20/2011  . HTN (hypertension) 12/20/2011  . Anxiety and depression 12/20/2011  . Hypothyroidism 12/20/2011  .  Abscess of forehead 12/20/2011    Past Surgical History:  Procedure Laterality Date  . CHOLECYSTECTOMY    . DILATION AND CURETTAGE OF UTERUS    . GASTRIC ROUX-EN-Y N/A 10/01/2013   Procedure: LAPAROSCOPIC ROUX-EN-Y GASTRIC BYPASS with hiatal hernia repair, WITH UPPER ENDOSCOPY;  Surgeon: Madilyn Hook, DO;  Location: [facility];  Service: General;  Laterality: N/A;  . LAPAROSCOPY N/A 09/23/2015   Procedure: LAPAROSCOPY DIAGNOSTIC placement of drain;  Surgeon: Johnathan Hausen, MD;  Location: [facility];  Service: General;  Laterality: N/A;  . SUPERFICIAL PERONEAL NERVE RELEASE Left 10/24/2014   Procedure: SUPERFICIAL PERONEAL NERVE RELEASE LEFT;  Surgeon: Alta Corning, MD;  Location: [facility];  Service: Orthopedics;  Laterality: Left;  . TUBAL LIGATION    . UPPER GI ENDOSCOPY N/A 09/23/2015   Procedure: UPPER GI ENDOSCOPY;  Surgeon: Johnathan Hausen, MD;  Location: [facility];  Service: General;  Laterality: N/A;     OB History   No obstetric history on file.     Family History  Problem Relation Age of Onset  . Breast cancer Mother 44       recurrence, metastatic, d 51  . Pancreatic cancer Paternal Uncle 9    Social History   Tobacco Use  . Smoking status: Former Smoker    Packs/day: 0.50    Years: 15.00    Pack years: 7.50    Types: Cigarettes    Quit date: 07/05/2013  Years since quitting: 7.3  . Smokeless tobacco: Never Used  Substance Use Topics  . Alcohol use: No    Alcohol/week: 20.0 standard drinks    Types: 10 Shots of liquor, 10 Standard drinks or equivalent per week    Comment: she used to drink alcohol moderate/heavy, quit 2014   . Drug use: Yes    Types: "Crack" cocaine    Comment: MAY 2013 LAST DRUG USE    Home Medications Prior to Admission medications   Medication Sig Start Date End Date Taking? Authorizing Provider  acetaminophen (TYLENOL) 500 MG tablet Take 1,000 mg by mouth every 6 (six) hours as needed for mild pain.    [provider]  ALPRAZolam Duanne Moron) 1 MG tablet Take 1 mg by mouth daily as needed for anxiety. 0-4 X day, as needed.    [provider]  amphetamine-dextroamphetamine (ADDERALL) 20 MG tablet Take 20 mg by mouth daily. Takes 1-4 X day, as needed.    [provider]  BIOTIN PO Take by mouth.    [provider]  busPIRone (BUSPAR) 15 MG tablet Take 30 mg by mouth 2 (two) times daily.  06/16/13   [provider]  Cholecalciferol (VITAMIN D) 2000 units CAPS Take 4,000 Units by mouth.    [provider]  FLUoxetine (PROZAC) 20 MG tablet Take 60 mg by mouth daily.  07/29/14   [provider]  furosemide (LASIX) 40 MG tablet Take 20 mg by mouth. Taking 40 mg in the AM; 20 mg at bedtime.    [provider]  hydrOXYzine (ATARAX/VISTARIL) 50 MG tablet Take 50 mg by mouth 3 (three) times daily as needed.    [provider]  lamoTRIgine (LAMICTAL) 100 MG tablet Take 150 mg by mouth 2 (two) times daily.     [provider]  Levothyroxine Sodium (TIROSINT) 112 MCG CAPS Take 1 tablet daily in am Patient taking differently: Take 1 capsule by mouth every morning. Take 1 tablet daily in am 12/23/14   Philemon Kingdom, MD  MELOXICAM PO Take by mouth.    [provider]  pantoprazole (PROTONIX) 40 MG tablet Take 1 tablet (40 mg total) by mouth 2 (two) times daily. Patient taking differently: Take 40 mg by mouth once.  09/28/15   Michael Boston, MD  sucralfate (CARAFATE) 1 GM/10ML suspension Take 1 g by mouth as needed.     [provider]  traZODone (DESYREL) 50 MG tablet Take 200 mg by mouth at bedtime.  06/16/13   [provider]    Allergies    Nsaids  Review of Systems   Review of Systems  Constitutional: Negative for fever.  HENT: Negative for rhinorrhea and sore throat.   Eyes: Negative for redness.  Respiratory: Negative for cough.   Cardiovascular: Negative for chest pain.  Gastrointestinal: Positive for  abdominal pain. Negative for diarrhea, nausea and vomiting.  Genitourinary: Negative for dysuria, frequency, hematuria and urgency.  Musculoskeletal: Negative for myalgias.  Skin: Negative for rash.  Neurological: Negative for headaches.    Physical Exam Updated Vital Signs BP 117/81 (BP Location: Left Arm)   Pulse 63   Temp 97.7 F (36.5 C) (Axillary)   Resp 17   SpO2 93%   Physical Exam Vitals and nursing note reviewed.  Constitutional:      General: She is in acute distress.     Appearance: She is well-developed.     Comments: Patient is moaning in pain  HENT:  Head: Normocephalic and atraumatic.     Right Ear: External ear normal.     Left Ear: External ear normal.     Nose: Nose normal.  Eyes:     Conjunctiva/sclera: Conjunctivae normal.  Cardiovascular:     Rate and Rhythm: Normal rate and regular rhythm.     Heart sounds: No murmur heard.   Pulmonary:     Effort: No respiratory distress.     Breath sounds: No wheezing, rhonchi or rales.  Abdominal:     Palpations: Abdomen is soft.     Tenderness: There is generalized abdominal tenderness. There is guarding and rebound.     Comments: Moderate to severe generalized abdominal tenderness to palpation, patient screams with palpation  Musculoskeletal:     Cervical back: Normal range of motion and neck supple.     Right lower leg: No edema.     Left lower leg: No edema.  Skin:    General: Skin is warm and dry.     Findings: No rash.  Neurological:     General: No focal deficit present.     Mental Status: She is alert. Mental status is at baseline.     Motor: No weakness.  Psychiatric:        Mood and Affect: Mood normal.     ED Results / Procedures / Treatments   Labs (all labs ordered are listed, but only abnormal results are displayed) Labs Reviewed  RESP PANEL BY RT-PCR (FLU A&B, COVID) ARPGX2 - Abnormal; Notable for the following components:      Result Value   SARS Coronavirus 2 by RT PCR  POSITIVE (*)    All other components within normal limits  COMPREHENSIVE METABOLIC PANEL - Abnormal; Notable for the following components:   Potassium 3.4 (*)    Glucose, Bld 102 (*)    Calcium 8.6 (*)    All other components within normal limits  LIPASE, BLOOD  CBC WITH DIFFERENTIAL/PLATELET  LACTIC ACID, PLASMA  URINALYSIS, ROUTINE W REFLEX MICROSCOPIC  I-STAT BETA HCG BLOOD, ED ([facility], [facility], [facility] ONLY)    EKG None  Radiology DG Abd 1 View  Result Date: 11/11/2020 CLINICAL DATA:  Abdominal pain. EXAM: ABDOMEN - 1 VIEW COMPARISON:  None. FINDINGS: This examination is limited as the patient was unable to tolerate additional imaging secondary to pain. Only the upper abdomen is imaged. There is gaseous distention of the colon. Mild bibasilar subsegmental atelectasis in the chest noted. IMPRESSION: Markedly limited study as only the upper abdomen is visualized. Gaseous distention of the colon is seen. Electronically Signed   By: Inge Rise M.D.   On: 11/11/2020 12:11   CT ABDOMEN PELVIS W CONTRAST  Result Date: 11/11/2020 CLINICAL DATA:  Acute generalized abdominal pain. EXAM: CT ABDOMEN AND PELVIS WITH CONTRAST TECHNIQUE: Multidetector CT imaging of the abdomen and pelvis was performed using the standard protocol following bolus administration of intravenous contrast. CONTRAST:  129m OMNIPAQUE IOHEXOL 300 MG/ML  SOLN COMPARISON:  September 23, 2015. FINDINGS: Lower chest: Mild bilateral posterior basilar subsegmental atelectasis or infiltrates are noted. Hepatobiliary: No focal liver abnormality is seen. Status post cholecystectomy. No biliary dilatation. Pancreas: Unremarkable. No pancreatic ductal dilatation or surrounding inflammatory changes. Spleen: Normal in size without focal abnormality. Adrenals/Urinary Tract: Adrenal glands are unremarkable. Kidneys are normal, without renal calculi, focal lesion, or hydronephrosis. Bladder is unremarkable. Stomach/Bowel: Status post gastric bypass with  probable gastrojejunostomy. However, there is pneumoperitoneum seen in the epigastric region as well as free air in  the porta hepatis region and near the gastrojejunostomy site concerning for bowel perforation. There is no evidence of bowel obstruction or inflammation. The appendix is unremarkable. Vascular/Lymphatic: No significant vascular findings are present. No enlarged abdominal or pelvic lymph nodes. Reproductive: Uterus and bilateral adnexa are unremarkable. Other: Small fat containing bilateral inguinal hernias are noted. No ascites is noted. Musculoskeletal: No acute or significant osseous findings. IMPRESSION: 1. 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 However, there is pneumoperitoneum seen in the epigastric region as well as free air in the porta hepatis region and near the gastrojejunostomy site concerning for bowel perforation. Critical Value/emergent results were called by telephone at the time of interpretation on 11/11/2020 at 1:42 pm to provider [facility] , who verbally acknowledged these results. 2. Small fat containing bilateral inguinal hernias are noted. 3. Mild bilateral posterior basilar subsegmental atelectasis or infiltrates are noted. Electronically Signed   By: Marijo Conception M.D.   On: 11/11/2020 13:42    Procedures Procedures (including critical care time)  Medications Ordered in ED Medications  piperacillin-tazobactam (ZOSYN) IVPB 3.375 g (3.375 g Intravenous New Bag/Given 11/11/20 1352)  HYDROmorphone (DILAUDID) injection 0.5 mg (has no administration in time range)  pantoprazole (PROTONIX) 80 mg in sodium chloride 0.9 % 100 mL IVPB (has no administration in time range)  pantoprazole (PROTONIX) 80 mg in sodium chloride 0.9 % 100 mL (0.8 mg/mL) infusion (has no administration in time range)  pantoprazole (PROTONIX) injection 40 mg (has no administration in time range)  HYDROmorphone (DILAUDID) injection 0.5 mg (0.5 mg Intravenous Given 11/11/20 1145)   ondansetron (ZOFRAN) injection 4 mg (4 mg Intravenous Given 11/11/20 1146)  morphine 4 MG/ML injection 4 mg (4 mg Intravenous Given 11/11/20 1309)  iohexol (OMNIPAQUE) 300 MG/ML solution 100 mL (100 mLs Intravenous Contrast Given 11/11/20 1317)    ED Course  I have reviewed the triage vital signs and the nursing notes.  Pertinent labs & imaging results that were available during my care of the patient were reviewed by me and considered in my medical decision making (see chart for details).  Patient seen and examined. Work-up initiated. Medications ordered. Patient unable to give a detailed history as she is moaning with each breath. Will need pain control, labs, and imaging.   Vital signs reviewed and are as follows: BP 117/81 (BP Location: Left Arm)   Pulse 63   Temp 97.7 F (36.5 C) (Axillary)   Resp 17   SpO2 93%   12:16 PM X-ray reviewed. Pending CT.   1:28 PM Additional pain medication ordered in interim. CT reviewed with Dr. Alvino Chapel. Concern for free air. Zosyn ordered. Awaiting radiology report.   1:53 PM Antibiotic started.  Patient continues to be in severe pain.  Additional pain medication ordered.  I have requested consult from general surgery.  Of note, patient is Covid positive.  She states that she has had cough, sore throat, body aches over the past several days.  She has not been vaccinated and is adamant that she does not want a vaccine.  2:24 PM General surgery to see. Request hospitalist admit 2/2 symptomatic covid infection.   2:50 PM Spoke with Dr. Marylyn Ishihara -- hospitalist consult requested. Discussed again with general surgery Wynetta Emery PA-C) who will be primary admitting.   CRITICAL CARE Performed by: Carlisle Cater PA-C Total critical care time: 40 minutes Critical care time was exclusive of separately billable procedures and treating other patients. Critical care was necessary to treat or prevent imminent or life-threatening deterioration.  Critical care was time  spent personally by me on the following activities: development of treatment plan with patient and/or surrogate as well as nursing, discussions with consultants, evaluation of patient's response to treatment, examination of patient, obtaining history from patient or surrogate, ordering and performing treatments and interventions, ordering and review of laboratory studies, ordering and review of radiographic studies, pulse oximetry and re-evaluation of patient's condition.       MDM Rules/Calculators/A&P                          Admit.     Final Clinical Impression(s) / ED Diagnoses Final diagnoses:  Bowel perforation ([place])  Pneumonia due to COVID-19 virus    Rx / DC Orders ED Discharge Orders    None       Carlisle Cater, PA-C 11/11/20 1451    Davonna Belling, MD 11/11/20 1553

## 2020-11-11 NOTE — Consult Note (Signed)
Medical Consultation  Kimberly Stanley [id number] DOB: [birth year] DOA: 11/11/2020 PCP: Harlan Stains, MD   Requesting physician: CCS Date of consultation: 11/11/2020 Reason for consultation: COVID mgmt  Impression/Recommendations Bowel perforation     - per primary team  COVID 19 infection     - only symptom is cough and she didn't find it significant enough to attempt treatment at home     - she has atelectasis on the lung fields we can see from current imaging; will ordered dedicated CXR     - she's on 2L [place], but this seems more d/t shallow breathing from abdominal pain as she will not take deep breathes on examination d/t pain     - if she weren't here for a bowel perf, would recommend outpatient remdes infusion; so we will order remdes here, IS, and anti-tussives, no need for steroids right now     - follow inflammatory markers  Hypokalemia     - check Mg2+; replace K+  TRH will follow-up again tomorrow. Please contact me if I can be of assistance in the meanwhile. Thank you for this consultation.  Chief Complaint: Severe abdominal pain.  HPI:  Kimberly Stanley is a 55 y.o. female with medical history significant of anxiety, gastric bypass and hypothyroidism. Presenting with complaints of RLQ ab pain. She reports that she started feeling "queezy" yesterday before going to bed. She did not take any medicine to help. When she woke up this morning around 0730 hrs, her nausea progressed to vomiting and sharp, constant RLQ abdominal pain. It worsened through the morning. She did not try any medicines to help. She decided she needed to come to the ED.   In the ED, she was found to have a perforated bowel. She was also found to be COVID positive. When questioned about respiratory symptoms, she states she had a cough over the weekend, but didn't feel like she needed to take any treatments for it. She otherwise felt fine from a respiratory standpoint. She reports that she is  unvaccinated.  Reports cough for a few day. Did not try any medications. Denies fevers or other symptoms. Unvaccinated.   Review of Systems:  Denies CP, palpitations, dyspnea, fevers, diarrhea. Remainder of ROS is negative for all not mentioned in HPI.   Past Medical History:  Diagnosis Date  . Cutaneous leiomyoma   . Depression   . Eating disorder    loose alot of weight since bypass  . Family history of breast cancer   . Family history of pancreatic cancer   . Hypertension   . Hypothyroidism   . Thyroid disease   . Wears contact lenses    Past Surgical History:  Procedure Laterality Date  . CHOLECYSTECTOMY    . DILATION AND CURETTAGE OF UTERUS    . GASTRIC ROUX-EN-Y N/A 10/01/2013   Procedure: LAPAROSCOPIC ROUX-EN-Y GASTRIC BYPASS with hiatal hernia repair, WITH UPPER ENDOSCOPY;  Surgeon: Madilyn Hook, DO;  Location: [facility];  Service: General;  Laterality: N/A;  . LAPAROSCOPY N/A 09/23/2015   Procedure: LAPAROSCOPY DIAGNOSTIC placement of drain;  Surgeon: Johnathan Hausen, MD;  Location: [facility];  Service: General;  Laterality: N/A;  . SUPERFICIAL PERONEAL NERVE RELEASE Left 10/24/2014   Procedure: SUPERFICIAL PERONEAL NERVE RELEASE LEFT;  Surgeon: Alta Corning, MD;  Location: [place];  Service: Orthopedics;  Laterality: Left;  . TUBAL LIGATION    . UPPER GI ENDOSCOPY N/A 09/23/2015   Procedure: UPPER GI ENDOSCOPY;  Surgeon: Johnathan Hausen,  MD;  Location: [facility];  Service: General;  Laterality: N/A;   Social History:  reports that she quit smoking about 7 years ago. Her smoking use included cigarettes. She has a 7.50 pack-year smoking history. She has never used smokeless tobacco. She reports current drug use. Drug: "Crack" cocaine. She reports that she does not drink alcohol.  Allergies  Allergen Reactions  . Nsaids Other (See Comments)    Perforated ulcer.  S/p gastric bypass   Family History  Problem Relation Age of Onset  . Breast cancer Mother 59        recurrence, metastatic, d 57  . Pancreatic cancer Paternal Uncle 79    Prior to Admission medications   Medication Sig Start Date End Date Taking? Authorizing Provider  acetaminophen (TYLENOL) 500 MG tablet Take 1,000 mg by mouth every 6 (six) hours as needed for mild pain.   Yes [provider]  ALPRAZolam Duanne Moron) 1 MG tablet Take 1 mg by mouth 4 (four) times daily as needed for anxiety.   Yes [provider]  amphetamine-dextroamphetamine (ADDERALL) 20 MG tablet Take 20 mg by mouth 4 (four) times daily as needed (focus).   Yes [provider]  busPIRone (BUSPAR) 30 MG tablet Take 30 mg by mouth 2 (two) times daily as needed (anxiety). 06/16/13  Yes [provider]  FLUoxetine HCl 60 MG TABS Take 60 mg by mouth daily.  07/29/14  Yes [provider]  furosemide (LASIX) 40 MG tablet Take 40 mg by mouth 2 (two) times daily.   Yes [provider]  gabapentin (NEURONTIN) 600 MG tablet Take 300-600 mg by mouth 3 (three) times daily.   Yes [provider]  lamoTRIgine (LAMICTAL) 150 MG tablet Take 300 mg by mouth daily.   Yes [provider]  Levothyroxine Sodium (TIROSINT) 112 MCG CAPS Take 1 tablet daily in am Patient taking differently: Take 112 mcg by mouth in the morning. 12/23/14  Yes Philemon Kingdom, MD  pantoprazole (PROTONIX) 40 MG tablet Take 1 tablet (40 mg total) by mouth 2 (two) times daily. Patient taking differently: Take 40 mg by mouth once. 09/28/15  Liston Alba, MD  Pediatric Multivitamins-Iron [facility] COMPLETE PO) Take 1 tablet by mouth daily.   Yes [provider]  traZODone (DESYREL) 50 MG tablet Take 150-300 mg by mouth at bedtime. 06/16/13  Yes [provider]   Physical Exam: Blood pressure 111/74, pulse 81, temperature 97.7 F (36.5 C), temperature source Axillary, resp. rate (!) 21, SpO2 91 %. Vitals:   11/11/20 1445 11/11/20 1515  BP: 91/74 111/74  Pulse: 85 81  Resp:  (!) 23 (!) 21  Temp:    SpO2: 93% 91%    General: 55 y.o. female resting in bed in NAD Eyes: PERRL, normal sclera ENMT: Nares patent w/o discharge, orophaynx clear, dentition normal, ears w/o discharge/lesions/ulcers Neck: Supple, trachea midline Cardiovascular: RRR, +S1, S2, no m/g/r, equal pulses throughout Respiratory: CTABL but shallow breathing d/t pain, no w/r/r, normal WOB on 2L [place] GI: BS hypoactive, ND, global tenderness, no masses noted, no organomegaly noted MSK: No e/c/c Skin: No rashes, bruises, ulcerations noted Neuro: A&O x 3, no focal deficits Psyc: Appropriate interaction and affect, calm/cooperative  Labs on Admission:  Basic Metabolic Panel: Recent Labs  Lab 11/11/20 1119  NA 137  K 3.4*  CL 104  CO2 24  GLUCOSE 102*  BUN 11  CREATININE 0.73  CALCIUM 8.6*   Liver Function Tests: Recent Labs  Lab 11/11/20  1119  AST 26  ALT 28  ALKPHOS 65  BILITOT 0.5  PROT 6.5  ALBUMIN 3.8   Recent Labs  Lab 11/11/20 1119  LIPASE 24   No results for input(s): AMMONIA in the last 168 hours. CBC: Recent Labs  Lab 11/11/20 1119  WBC 6.1  NEUTROABS 4.6  HGB 14.5  HCT 42.1  MCV 94.6  PLT 218   Cardiac Enzymes: No results for input(s): CKTOTAL, CKMB, CKMBINDEX, TROPONINI in the last 168 hours. BNP: Invalid input(s): POCBNP CBG: No results for input(s): GLUCAP in the last 168 hours.  Radiological Exams on Admission: DG Abd 1 View  Result Date: 11/11/2020 CLINICAL DATA:  Abdominal pain. EXAM: ABDOMEN - 1 VIEW COMPARISON:  None. FINDINGS: This examination is limited as the patient was unable to tolerate additional imaging secondary to pain. Only the upper abdomen is imaged. There is gaseous distention of the colon. Mild bibasilar subsegmental atelectasis in the chest noted. IMPRESSION: Markedly limited study as only the upper abdomen is visualized. Gaseous distention of the colon is seen. Electronically Signed   By: Drusilla Kanner M.D.   On: 11/11/2020  12:11   CT ABDOMEN PELVIS W CONTRAST  Result Date: 11/11/2020 CLINICAL DATA:  Acute generalized abdominal pain. EXAM: CT ABDOMEN AND PELVIS WITH CONTRAST TECHNIQUE: Multidetector CT imaging of the abdomen and pelvis was performed using the standard protocol following bolus administration of intravenous contrast. CONTRAST:  OMNIPAQUE IOHEXOL 300 MG/ML  SOLN COMPARISON:  September 23, 2015. FINDINGS: Lower chest: Mild bilateral posterior basilar subsegmental atelectasis or infiltrates are noted. Hepatobiliary: No focal liver abnormality is seen. Status post cholecystectomy. No biliary dilatation. Pancreas: Unremarkable. No pancreatic ductal dilatation or surrounding inflammatory changes. Spleen: Normal in size without focal abnormality. Adrenals/Urinary Tract: Adrenal glands are unremarkable. Kidneys are normal, without renal calculi, focal lesion, or hydronephrosis. Bladder is unremarkable. 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 However, there is pneumoperitoneum seen in the epigastric region as well as free air in the porta hepatis region and near the gastrojejunostomy site concerning for bowel perforation. There is no evidence of bowel obstruction or inflammation. The appendix is unremarkable. Vascular/Lymphatic: No significant vascular findings are present. No enlarged abdominal or pelvic lymph nodes. Reproductive: Uterus and bilateral adnexa are unremarkable. Other: Small fat containing bilateral inguinal hernias are noted. No ascites is noted. Musculoskeletal: No acute or significant osseous findings. IMPRESSION: 1. 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 However, there is pneumoperitoneum seen in the epigastric region as well as free air in the porta hepatis region and near the gastrojejunostomy site concerning for bowel perforation. Critical Value/emergent results were called by telephone at the time of interpretation on 11/11/2020 at 1:42 pm to  provider [facility] , who verbally acknowledged these results. 2. Small fat containing bilateral inguinal hernias are noted. 3. Mild bilateral posterior basilar subsegmental atelectasis or infiltrates are noted. Electronically Signed   By: Lupita Raider M.D.   On: 11/11/2020 13:42   Time spent: 45 minutes spent in the coordination of this consult.   Teddy Spike DO [facility]  If 7PM-7AM, please contact night-coverage [email or web address] 11/11/2020, 3:57 PM

## 2020-11-11 NOTE — Progress Notes (Addendum)
I have re-reviewed the the patient's records, history, medications, and allergies.  I have re-examined the patient.  COVID + with free air.  [place] screaming in pain - partially consolable - abd pain more diffuse now - worst in epigastric region.  I again discussed intraoperative plans and goals of post-operative recovery.  Dx lap with patch of re-perforated GJ ulcer. R/o other etiologies.  The patient agrees to proceed.  Patient in severe pain - d/w ED RN to give more pain meds.  D/w OR Staff - OR room now available & they going to get patient w COVID precautions in place  Kimberly Stanley  [birth year] [id number]  Patient Care Team: Harlan Stains, MD as PCP - General (Family Medicine) Kennith Center, RD as Dietitian (Family Medicine)  Patient Active Problem List   Diagnosis Date Noted  . Alcohol abuse, uncomplicated 78/29/5621  . Bipolar 2 disorder ([place]) 11/11/2020  . Eating disorder 11/11/2020  . Edema 11/11/2020  . Esophageal dysphagia 11/11/2020  . Gastroesophageal reflux disease 11/11/2020  . Genital herpes simplex 11/11/2020  . Iron deficiency anemia 11/11/2020  . Vitamin D deficiency 11/11/2020  . Personal history of other specified conditions 11/11/2020  . Peptic ulcer disease 11/11/2020  . 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 Monoallelic mutation of CHEK2 gene in female patient 09/19/2018  . Genetic testing 08/23/2018  . Family history of breast cancer   . Family history of pancreatic cancer   . Cutaneous leiomyoma   . Intra-abdominal free air of unknown etiology 09/23/2015  . 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 Preseptal cellulitis 12/20/2011  . Leukocytosis 12/20/2011  . HTN (hypertension) 12/20/2011  . Anxiety and depression 12/20/2011  . Hypothyroidism 12/20/2011  . Abscess of forehead 12/20/2011    Past Medical  History:  Diagnosis Date  . Cutaneous leiomyoma   . Depression   . Eating disorder    loose alot of weight since bypass  . Family history of breast cancer   . Family history of pancreatic cancer   . Hypertension   . Hypothyroidism   . Thyroid disease   . Wears contact lenses     Past Surgical History:  Procedure Laterality Date  . CHOLECYSTECTOMY    . DILATION AND CURETTAGE OF UTERUS    . GASTRIC ROUX-EN-Y N/A 10/01/2013   Procedure: LAPAROSCOPIC ROUX-EN-Y GASTRIC BYPASS with hiatal hernia repair, WITH UPPER ENDOSCOPY;  Surgeon: Madilyn Hook, DO;  Location: [facility];  Service: General;  Laterality: N/A;  . LAPAROSCOPY N/A 09/23/2015   Procedure: LAPAROSCOPY DIAGNOSTIC placement of drain;  Surgeon: Johnathan Hausen, MD;  Location: [facility];  Service: General;  Laterality: N/A;  . SUPERFICIAL PERONEAL NERVE RELEASE Left 10/24/2014   Procedure: SUPERFICIAL PERONEAL NERVE RELEASE LEFT;  Surgeon: Alta Corning, MD;  Location: [facility];  Service: Orthopedics;  Laterality: Left;  . TUBAL LIGATION    . UPPER GI ENDOSCOPY N/A 09/23/2015   Procedure: UPPER GI ENDOSCOPY;  Surgeon: Johnathan Hausen, MD;  Location: [facility];  Service: General;  Laterality: N/A;    Social History   Socioeconomic History  . Marital status: Married    Spouse name: Not on file  . Number of children: Not on file  . Years of education: Not on file  . Highest education level: Not on file  Occupational History  . Not on file  Tobacco Use  . Smoking status: Former Smoker    Packs/day: 0.50    Years: 15.00    Pack years: 7.50    Types: Cigarettes    Quit date: 07/05/2013    Years since quitting: 7.3  . Smokeless tobacco: Never Used  Substance and Sexual Activity  . Alcohol use: No    Alcohol/week: 20.0 standard drinks    Types: 10 Shots of liquor, 10 Standard drinks or equivalent per week    Comment: she used to drink alcohol moderate/heavy, quit 2014   . Drug use: Yes    Types: "Crack" cocaine     Comment: MAY 2013 LAST DRUG USE  . Sexual activity: Not on file    Comment: Novasure  Other Topics Concern  . Not on file  Social History Narrative  . Not on file   Social Determinants of Health   Financial Resource Strain: Not on file  Food Insecurity: Not on file  Transportation Needs: Not on file  Physical Activity: Not on file  Stress: Not on file  Social Connections: Not on file  Intimate Partner Violence: Not on file    Family History  Problem Relation Age of Onset  . Breast cancer Mother 19       recurrence, metastatic, d 65  . Pancreatic cancer Paternal Uncle 29    (Not in a hospital admission)   Current Facility-Administered Medications  Medication Dose Route Frequency Provider Last Rate Last Admin  . fentaNYL (SUBLIMAZE) injection 50 mcg  50 mcg Intravenous Q2H PRN Norm Parcel, PA-C   50 mcg at 11/11/20 1639  . HYDROmorphone (DILAUDID) injection 0.5 mg  0.5 mg Intravenous Q30 min PRN Carlisle Cater, PA-C   0.5 mg at 11/11/20 1509  . pantoprazole (PROTONIX) 80 mg in sodium chloride 0.9 % 100 mL (0.8 mg/mL) infusion  8 mg/hr Intravenous Continuous Norm Parcel, PA-C 10 mL/hr at 11/11/20 1643 8 mg/hr at 11/11/20 1643  . [START ON 11/15/2020] pantoprazole (PROTONIX) injection 40 mg  40 mg Intravenous Q12H Barkley Boards R, PA-C      . sodium chloride flush (NS) 0.9 % injection 3 mL  3 mL Intravenous Gorden Harms, MD       Current Outpatient Medications  Medication Sig Dispense Refill  . acetaminophen (TYLENOL) 500 MG tablet Take 1,000 mg by mouth every 6 (six) hours as needed for mild pain.    Marland Kitchen ALPRAZolam (XANAX) 1 MG tablet Take 1 mg by mouth 4 (four) times daily as needed for anxiety.    Marland Kitchen amphetamine-dextroamphetamine (ADDERALL) 20 MG tablet Take 20 mg by mouth 4 (four) times daily as needed (focus).    . busPIRone (BUSPAR) 30 MG tablet Take 30 mg by mouth 2 (two) times daily as needed (anxiety).    Marland Kitchen FLUoxetine HCl 60 MG TABS Take 60 mg by mouth  daily.   1  . furosemide (LASIX) 40 MG tablet Take 40 mg by mouth 2 (two) times daily.    Marland Kitchen gabapentin (NEURONTIN) 600 MG tablet Take 300-600 mg by mouth 3 (three) times daily.    Marland Kitchen lamoTRIgine (LAMICTAL) 150 MG tablet Take 300 mg by mouth daily.    . Levothyroxine Sodium (TIROSINT) 112 MCG CAPS Take 1 tablet daily in am (Patient taking differently: Take 112 mcg by mouth in the morning.) 60 capsule 1  . pantoprazole (PROTONIX) 40 MG tablet Take 1 tablet (40 mg total) by mouth 2 (two) times daily. (Patient taking differently: Take 40 mg by mouth  once.) 90 tablet 0  . Pediatric Multivitamins-Iron (FLINTSTONES COMPLETE PO) Take 1 tablet by mouth daily.    . traZODone (DESYREL) 50 MG tablet Take 150-300 mg by mouth at bedtime.       Allergies  Allergen Reactions  . Nsaids Other (See Comments)    Perforated ulcer.  S/p gastric bypass    BP 99/69   Pulse 76   Temp 97.7 F (36.5 C) (Axillary)   Resp 20   SpO2 94%   Labs: Results for orders placed or performed during the hospital encounter of 11/11/20 (from the past 48 hour(s))  Comprehensive metabolic panel     Status: Abnormal   Collection Time: 11/11/20 11:19 AM  Result Value Ref Range   Sodium 137 135 - 145 mmol/L   Potassium 3.4 (L) 3.5 - 5.1 mmol/L   Chloride 104 98 - 111 mmol/L   CO2 24 22 - 32 mmol/L   Glucose, Bld 102 (H) 70 - 99 mg/dL    Comment: Glucose reference range applies only to samples taken after fasting for at least 8 hours.   BUN 11 6 - 20 mg/dL   Creatinine, Ser 0.73 0.44 - 1.00 mg/dL   Calcium 8.6 (L) 8.9 - 10.3 mg/dL   Total Protein 6.5 6.5 - 8.1 g/dL   Albumin 3.8 3.5 - 5.0 g/dL   AST 26 15 - 41 U/L   ALT 28 0 - 44 U/L   Alkaline Phosphatase 65 38 - 126 U/L   Total Bilirubin 0.5 0.3 - 1.2 mg/dL   GFR, Estimated >60 >60 mL/min    Comment: (NOTE) Calculated using the CKD-EPI Creatinine Equation (2021)    Anion gap 9 5 - 15    Comment: Performed at [facility], [place] [address],  [place], [place] [address]  Lipase, blood     Status: None   Collection Time: 11/11/20 11:19 AM  Result Value Ref Range   Lipase 24 11 - 51 U/L    Comment: Performed at [facility], [place] [address], [place], [place] [address]  CBC with Diff     Status: None   Collection Time: 11/11/20 11:19 AM  Result Value Ref Range   WBC 6.1 4.0 - 10.5 K/uL   RBC 4.45 3.87 - 5.11 MIL/uL   Hemoglobin 14.5 12.0 - 15.0 g/dL   HCT 42.1 36.0 - 46.0 %   MCV 94.6 80.0 - 100.0 fL   MCH 32.6 26.0 - 34.0 pg   MCHC 34.4 30.0 - 36.0 g/dL   RDW 11.9 11.5 - 15.5 %   Platelets 218 150 - 400 K/uL   nRBC 0.0 0.0 - 0.2 %   Neutrophils Relative % 75 %   Neutro Abs 4.6 1.7 - 7.7 K/uL   Lymphocytes Relative 19 %   Lymphs Abs 1.1 0.7 - 4.0 K/uL   Monocytes Relative 5 %   Monocytes Absolute 0.3 0.1 - 1.0 K/uL   Eosinophils Relative 1 %   Eosinophils Absolute 0.1 0.0 - 0.5 K/uL   Basophils Relative 0 %   Basophils Absolute 0.0 0.0 - 0.1 K/uL   Immature Granulocytes 0 %   Abs Immature Granulocytes 0.01 0.00 - 0.07 K/uL    Comment: Performed at [facility], [place] [address], [place], [place] [address]  Lactic acid, plasma     Status: None   Collection Time: 11/11/20 11:20 AM  Result Value Ref Range   Lactic Acid, Venous 1.1 0.5 - 1.9 mmol/L  Comment: Performed at [facility], [place] [address] [place], [place] [address]  Resp Panel by RT-PCR (Flu A&B, Covid) Nasopharyngeal Swab     Status: Abnormal   Collection Time: 11/11/20 11:24 AM   Specimen: Nasopharyngeal Swab; Nasopharyngeal(NP) swabs in vial transport medium  Result Value Ref Range   SARS Coronavirus 2 by RT PCR POSITIVE (A) NEGATIVE    Comment: RESULT CALLED TO, READ BACK BY AND VERIFIED WITH: Elayne Guerin 11/11/20 '@1327'  BY SEEL,MOLLY (NOTE) SARS-CoV-2 target nucleic acids are DETECTED.  The SARS-CoV-2 RNA is generally detectable in upper respiratory specimens during the acute phase of infection.  Positive results are indicative of the presence of the identified virus, but do not rule out bacterial infection or co-infection with other pathogens not detected by the test. Clinical correlation with patient history and other diagnostic information is necessary to determine patient infection status. The expected result is Negative.  Fact Sheet for Patients: EntrepreneurPulse.com.au  Fact Sheet for Healthcare Providers: IncredibleEmployment.be  This test is not yet approved or cleared by the [place] FDA and  has been authorized for detection and/or diagnosis of SARS-CoV-2 by FDA under an Emergency Use Authorization (EUA).  This EUA will remain in effect (meaning this te st can be used) for the duration of  the COVID-19 declaration under Section 564(b)(1) of the Act, 21 U.S.C. section 360bbb-3(b)(1), unless the authorization is terminated or revoked sooner.     Influenza A by PCR NEGATIVE NEGATIVE   Influenza B by PCR NEGATIVE NEGATIVE    Comment: (NOTE) The Xpert Xpress SARS-CoV-2/FLU/RSV plus assay is intended as an aid in the diagnosis of influenza from Nasopharyngeal swab specimens and should not be used as a sole basis for treatment. Nasal washings and aspirates are unacceptable for Xpert Xpress SARS-CoV-2/FLU/RSV testing.  Fact Sheet for Patients: EntrepreneurPulse.com.au  Fact Sheet for Healthcare Providers: IncredibleEmployment.be  This test is not yet approved or cleared by the [place] FDA and has been authorized for detection and/or diagnosis of SARS-CoV-2 by FDA under an Emergency Use Authorization (EUA). This EUA will remain in effect (meaning this test can be used) for the duration of the COVID-19 declaration under Section 564(b)(1) of the Act, 21 U.S.C. section 360bbb-3(b)(1), unless the authorization is terminated or revoked.  Performed at [facility], [place] [address], [place], [place] [address]   I-Stat beta hCG blood, ED     Status: None   Collection Time: 11/11/20 12:16 PM  Result Value Ref Range   I-stat hCG, quantitative <5.0 <5 mIU/mL   Comment 3            Comment:   GEST. AGE      CONC.  (mIU/mL)   <=1 WEEK        5 - 50     2 WEEKS       50 - 500     3 WEEKS       100 - 10,000     4 WEEKS     1,000 - 30,000        FEMALE AND NON-PREGNANT FEMALE:     LESS THAN 5 mIU/mL     Imaging / Studies: DG Abd 1 View  Result Date: 11/11/2020 CLINICAL DATA:  Abdominal pain. EXAM: ABDOMEN - 1 VIEW COMPARISON:  None. FINDINGS: This examination is limited as the patient was unable to tolerate additional imaging secondary to pain. Only the upper abdomen is imaged. There is gaseous distention of the colon. Mild  bibasilar subsegmental atelectasis in the chest noted. IMPRESSION: Markedly limited study as only the upper abdomen is visualized. Gaseous distention of the colon is seen. Electronically Signed   By: Inge Rise M.D.   On: 11/11/2020 12:11   CT ABDOMEN PELVIS W CONTRAST  Result Date: 11/11/2020 CLINICAL DATA:  Acute generalized abdominal pain. EXAM: CT ABDOMEN AND PELVIS WITH CONTRAST TECHNIQUE: Multidetector CT imaging of the abdomen and pelvis was performed using the standard protocol following bolus administration of intravenous contrast. CONTRAST:  160m OMNIPAQUE IOHEXOL 300 MG/ML  SOLN COMPARISON:  September 23, 2015. FINDINGS: Lower chest: Mild bilateral posterior basilar subsegmental atelectasis or infiltrates are noted. Hepatobiliary: No focal liver abnormality is seen. Status post cholecystectomy. No biliary dilatation. Pancreas: Unremarkable. No pancreatic ductal dilatation or surrounding inflammatory changes. Spleen: Normal in size without focal abnormality. Adrenals/Urinary Tract: Adrenal glands are unremarkable. Kidneys are normal, without renal calculi, focal lesion, or hydronephrosis. Bladder is unremarkable. Stomach/Bowel: Status  post gastric bypass with probable gastrojejunostomy. However, there is pneumoperitoneum seen in the epigastric region as well as free air in the porta hepatis region and near the gastrojejunostomy site concerning for bowel perforation. There is no evidence of bowel obstruction or inflammation. The appendix is unremarkable. Vascular/Lymphatic: No significant vascular findings are present. No enlarged abdominal or pelvic lymph nodes. Reproductive: Uterus and bilateral adnexa are unremarkable. Other: Small fat containing bilateral inguinal hernias are noted. No ascites is noted. Musculoskeletal: No acute or significant osseous findings. IMPRESSION: 1. 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 However, there is pneumoperitoneum seen in the epigastric region as well as free air in the porta hepatis region and near the gastrojejunostomy site concerning for bowel perforation. Critical Value/emergent results were called by telephone at the time of interpretation on 11/11/2020 at 1:42 pm to provider [facility], who verbally acknowledged these results. 2. Small fat containing bilateral inguinal hernias are noted. 3. Mild bilateral posterior basilar subsegmental atelectasis or infiltrates are noted. Electronically Signed   By: JMarijo ConceptionM.D.   On: 11/11/2020 13:42     .SAdin Hector M.D., F.A.C.S. Gastrointestinal [facility]ally Invasive [facility] [address] [place] [phone] / Paging  11/11/2020 5:52 PM

## 2020-11-11 NOTE — H&P (Signed)
Admission Note  Kimberly Stanley [facility] [birth year]  NM:[id number].    Requesting MD: Carlisle Cater PA-C Chief Complaint/Reason for Consult: Abdominal pain  HPI:  Patient presented to [facility] with severe abdominal pain that started around 0830 this AM, acute onset. Pain is generalized but radiates to shoulders and has progressively worsened. Nothing is relieving pain. She had a BM this AM per EDP. She reported several days of cough, sore throat and body aches as well. She has tested positive for COVID in the ED. Patient has a hx of roux-en Y gastric bypass in 2014 and had a marginal ulcer in 2016, reports symptoms as similar to this but worse. At that time she underwent laparoscopy and EGD, perforation was found to be contained and drain placed. Patient improved and had not had any further symptoms. She is not currently on a PPI and does not usually take carafate. She denies any blood thinning medications. No other abdominal surgeries. PMH otherwise significant for HTN, hypothyroidism and depression. NKDA. She denies alcohol, tobacco or illicit drug use.   ROS: Review of Systems  Unable to perform ROS: Severity of pain  Gastrointestinal: Positive for abdominal pain and nausea. Negative for constipation, diarrhea and vomiting.  Genitourinary: Negative for dysuria, frequency and urgency.    Family History  Problem Relation Age of Onset  . Breast cancer Mother 66       recurrence, metastatic, d 61  . Pancreatic cancer Paternal Uncle 70    Past Medical History:  Diagnosis Date  . Cutaneous leiomyoma   . Depression   . Eating disorder    loose alot of weight since bypass  . Family history of breast cancer   . Family history of pancreatic cancer   . Hypertension   . Hypothyroidism   . Thyroid disease   . Wears contact lenses     Past Surgical History:  Procedure Laterality Date  . CHOLECYSTECTOMY    . DILATION AND CURETTAGE OF UTERUS    . GASTRIC ROUX-EN-Y N/A 10/01/2013    Procedure: LAPAROSCOPIC ROUX-EN-Y GASTRIC BYPASS with hiatal hernia repair, WITH UPPER ENDOSCOPY;  Surgeon: Madilyn Hook, DO;  Location: [facility];  Service: General;  Laterality: N/A;  . LAPAROSCOPY N/A 09/23/2015   Procedure: LAPAROSCOPY DIAGNOSTIC placement of drain;  Surgeon: Johnathan Hausen, MD;  Location: [facility];  Service: General;  Laterality: N/A;  . SUPERFICIAL PERONEAL NERVE RELEASE Left 10/24/2014   Procedure: SUPERFICIAL PERONEAL NERVE RELEASE LEFT;  Surgeon: Alta Corning, MD;  Location: [facility];  Service: Orthopedics;  Laterality: Left;  . TUBAL LIGATION    . UPPER GI ENDOSCOPY N/A 09/23/2015   Procedure: UPPER GI ENDOSCOPY;  Surgeon: Johnathan Hausen, MD;  Location: [facility];  Service: General;  Laterality: N/A;    Social History:  reports that she quit smoking about 7 years ago. Her smoking use included cigarettes. She has a 7.50 pack-year smoking history. She has never used smokeless tobacco. She reports current drug use. Drug: "Crack" cocaine. She reports that she does not drink alcohol.  Allergies:  Allergies  Allergen Reactions  . Nsaids Other (See Comments)    Perforated ulcer.  S/p gastric bypass    (Not in a hospital admission)   Blood pressure 116/75, pulse 82, temperature 97.7 F (36.5 C), temperature source Axillary, resp. rate (!) 28, SpO2 95 %. Physical Exam:  General: WD, overweight female who is writhing in pain and crying  HEENT: head is normocephalic, atraumatic.  Sclera are noninjected.  PERRL.  Ears and nose without any masses or lesions.  Mouth is pink and moist Heart: regular, rate, and rhythm.  Normal s1,s2. No obvious murmurs, gallops, or rubs noted.  Palpable radial and pedal pulses bilaterally Lungs: CTAB, no wheezes, rhonchi, or rales noted.  Respiratory effort nonlabored Abd: soft, generalized ttp with rebound tenderness, ND MS: all 4 extremities are symmetrical with no cyanosis, clubbing, or edema. Skin: warm and dry with no masses,  lesions, or rashes Neuro: Cranial nerves 2-12 grossly intact, sensation is normal throughout Psych: A&Ox3 with an anxious affect.   Results for orders placed or performed during the hospital encounter of 11/11/20 (from the past 48 hour(s))  Comprehensive metabolic panel     Status: Abnormal   Collection Time: 11/11/20 11:19 AM  Result Value Ref Range   Sodium 137 135 - 145 mmol/L   Potassium 3.4 (L) 3.5 - 5.1 mmol/L   Chloride 104 98 - 111 mmol/L   CO2 24 22 - 32 mmol/L   Glucose, Bld 102 (H) 70 - 99 mg/dL    Comment: Glucose reference range applies only to samples taken after fasting for at least 8 hours.   BUN 11 6 - 20 mg/dL   Creatinine, Ser 0.73 0.44 - 1.00 mg/dL   Calcium 8.6 (L) 8.9 - 10.3 mg/dL   Total Protein 6.5 6.5 - 8.1 g/dL   Albumin 3.8 3.5 - 5.0 g/dL   AST 26 15 - 41 U/L   ALT 28 0 - 44 U/L   Alkaline Phosphatase 65 38 - 126 U/L   Total Bilirubin 0.5 0.3 - 1.2 mg/dL   GFR, Estimated >60 >60 mL/min    Comment: (NOTE) Calculated using the CKD-EPI Creatinine Equation (2021)    Anion gap 9 5 - 15    Comment: Performed at [facility], [place] [address], [place], [place] [address]  Lipase, blood     Status: None   Collection Time: 11/11/20 11:19 AM  Result Value Ref Range   Lipase 24 11 - 51 U/L    Comment: Performed at [facility], [place] [address], [place], [place] [address]  CBC with Diff     Status: None   Collection Time: 11/11/20 11:19 AM  Result Value Ref Range   WBC 6.1 4.0 - 10.5 K/uL   RBC 4.45 3.87 - 5.11 MIL/uL   Hemoglobin 14.5 12.0 - 15.0 g/dL   HCT 42.1 36.0 - 46.0 %   MCV 94.6 80.0 - 100.0 fL   MCH 32.6 26.0 - 34.0 pg   MCHC 34.4 30.0 - 36.0 g/dL   RDW 11.9 11.5 - 15.5 %   Platelets 218 150 - 400 K/uL   nRBC 0.0 0.0 - 0.2 %   Neutrophils Relative % 75 %   Neutro Abs 4.6 1.7 - 7.7 K/uL   Lymphocytes Relative 19 %   Lymphs Abs 1.1 0.7 - 4.0 K/uL   Monocytes Relative 5 %   Monocytes Absolute 0.3 0.1 - 1.0  K/uL   Eosinophils Relative 1 %   Eosinophils Absolute 0.1 0.0 - 0.5 K/uL   Basophils Relative 0 %   Basophils Absolute 0.0 0.0 - 0.1 K/uL   Immature Granulocytes 0 %   Abs Immature Granulocytes 0.01 0.00 - 0.07 K/uL    Comment: Performed at [facility], [place] [address], [place], [place] [address]  Lactic acid, plasma     Status: None   Collection Time: 11/11/20 11:20 AM  Result Value  Ref Range   Lactic Acid, Venous 1.1 0.5 - 1.9 mmol/L    Comment: Performed at [facility], [place] [address] [place], [place] [address]  Resp Panel by RT-PCR (Flu A&B, Covid) Nasopharyngeal Swab     Status: Abnormal   Collection Time: 11/11/20 11:24 AM   Specimen: Nasopharyngeal Swab; Nasopharyngeal(NP) swabs in vial transport medium  Result Value Ref Range   SARS Coronavirus 2 by RT PCR POSITIVE (A) NEGATIVE    Comment: RESULT CALLED TO, READ BACK BY AND VERIFIED WITH: Elayne Guerin 11/11/20 @1327  BY SEEL,MOLLY (NOTE) SARS-CoV-2 target nucleic acids are DETECTED.  The SARS-CoV-2 RNA is generally detectable in upper respiratory specimens during the acute phase of infection. Positive results are indicative of the presence of the identified virus, but do not rule out bacterial infection or co-infection with other pathogens not detected by the test. Clinical correlation with patient history and other diagnostic information is necessary to determine patient infection status. The expected result is Negative.  Fact Sheet for Patients: EntrepreneurPulse.com.au  Fact Sheet for Healthcare Providers: IncredibleEmployment.be  This test is not yet approved or cleared by the [place] FDA and  has been authorized for detection and/or diagnosis of SARS-CoV-2 by FDA under an Emergency Use Authorization (EUA).  This EUA will remain in effect (meaning this te st can be used) for the duration of  the COVID-19 declaration under Section  564(b)(1) of the Act, 21 U.S.C. section 360bbb-3(b)(1), unless the authorization is terminated or revoked sooner.     Influenza A by PCR NEGATIVE NEGATIVE   Influenza B by PCR NEGATIVE NEGATIVE    Comment: (NOTE) The Xpert Xpress SARS-CoV-2/FLU/RSV plus assay is intended as an aid in the diagnosis of influenza from Nasopharyngeal swab specimens and should not be used as a sole basis for treatment. Nasal washings and aspirates are unacceptable for Xpert Xpress SARS-CoV-2/FLU/RSV testing.  Fact Sheet for Patients: EntrepreneurPulse.com.au  Fact Sheet for Healthcare Providers: IncredibleEmployment.be  This test is not yet approved or cleared by the [place] FDA and has been authorized for detection and/or diagnosis of SARS-CoV-2 by FDA under an Emergency Use Authorization (EUA). This EUA will remain in effect (meaning this test can be used) for the duration of the COVID-19 declaration under Section 564(b)(1) of the Act, 21 U.S.C. section 360bbb-3(b)(1), unless the authorization is terminated or revoked.  Performed at [facility], [place] [address] [place], [place] [address]   I-Stat beta hCG blood, ED     Status: None   Collection Time: 11/11/20 12:16 PM  Result Value Ref Range   I-stat hCG, quantitative <5.0 <5 mIU/mL   Comment 3            Comment:   GEST. AGE      CONC.  (mIU/mL)   <=1 WEEK        5 - 50     2 WEEKS       50 - 500     3 WEEKS       100 - 10,000     4 WEEKS     1,000 - 30,000        FEMALE AND NON-PREGNANT FEMALE:     LESS THAN 5 mIU/mL    DG Abd 1 View  Result Date: 11/11/2020 CLINICAL DATA:  Abdominal pain. EXAM: ABDOMEN - 1 VIEW COMPARISON:  None. FINDINGS: This examination is limited as the patient was unable to tolerate additional imaging secondary to pain. Only the upper abdomen  is imaged. There is gaseous distention of the colon. Mild bibasilar subsegmental atelectasis in the chest noted.  IMPRESSION: Markedly limited study as only the upper abdomen is visualized. Gaseous distention of the colon is seen. Electronically Signed   By: Drusilla Kanner M.D.   On: 11/11/2020 12:11   CT ABDOMEN PELVIS W CONTRAST  Result Date: 11/11/2020 CLINICAL DATA:  Acute generalized abdominal pain. EXAM: CT ABDOMEN AND PELVIS WITH CONTRAST TECHNIQUE: Multidetector CT imaging of the abdomen and pelvis was performed using the standard protocol following bolus administration of intravenous contrast. CONTRAST:  OMNIPAQUE IOHEXOL 300 MG/ML  SOLN COMPARISON:  September 23, 2015. FINDINGS: Lower chest: Mild bilateral posterior basilar subsegmental atelectasis or infiltrates are noted. Hepatobiliary: No focal liver abnormality is seen. Status post cholecystectomy. No biliary dilatation. Pancreas: Unremarkable. No pancreatic ductal dilatation or surrounding inflammatory changes. Spleen: Normal in size without focal abnormality. Adrenals/Urinary Tract: Adrenal glands are unremarkable. Kidneys are normal, without renal calculi, focal lesion, or hydronephrosis. Bladder is unremarkable. 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 However, there is pneumoperitoneum seen in the epigastric region as well as free air in the porta hepatis region and near the gastrojejunostomy site concerning for bowel perforation. There is no evidence of bowel obstruction or inflammation. The appendix is unremarkable. Vascular/Lymphatic: No significant vascular findings are present. No enlarged abdominal or pelvic lymph nodes. Reproductive: Uterus and bilateral adnexa are unremarkable. Other: Small fat containing bilateral inguinal hernias are noted. No ascites is noted. Musculoskeletal: No acute or significant osseous findings. IMPRESSION: 1. 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 However, there is pneumoperitoneum seen in the epigastric region as well as free air in the porta hepatis region  and near the gastrojejunostomy site concerning for bowel perforation. Critical Value/emergent results were called by telephone at the time of interpretation on 11/11/2020 at 1:42 pm to provider [facility] , who verbally acknowledged these results. 2. Small fat containing bilateral inguinal hernias are noted. 3. Mild bilateral posterior basilar subsegmental atelectasis or infiltrates are noted. Electronically Signed   By: Lupita Raider M.D.   On: 11/11/2020 13:42      Assessment/Plan COVID - 19 infection - TRH to consult  HTN Hypothyroidism  Depression   Hx of Roux-en Y gastric bypass 2014 Hx of perforated marginal ulcer at GJ with diagnostic laparoscopy and drainage of collection 2016 Dr. Daphine Deutscher Perforated viscus - no leukocytosis, afebrile and HD stable - patient with peritonitis  - Zosyn started, PPI gtt ordered  - to OR tonight   Juliet Rude, [facility] 11/11/2020, 2:57 PM Please see Amion for pager number during day hours 7:00am-4:30pm

## 2020-11-11 NOTE — Transfer of Care (Signed)
Immediate Anesthesia Transfer of Care Note  Patient: Kimberly Stanley  Procedure(s) Performed: LAPAROSCOPY AND DRAINAGE OF BOWEL PERFORATION WITH OMENTAL GRAHAM PATCH FOR ULCER (N/A Abdomen) LAPAROSCOPIC LYSIS OF ADHESIONS (Abdomen)  Patient Location: ICU  Anesthesia Type:General  Level of Consciousness: awake, alert  and responds to stimulation  Airway & Oxygen Therapy: Patient Spontanous Breathing and non-rebreather face mask  Post-op Assessment: Report given to RN and Post -op Vital signs reviewed and stable  Post vital signs: Reviewed and stable  Last Vitals:  Vitals Value Taken Time  BP 119/93 11/11/20 2039  Temp    Pulse 90 11/11/20 2045  Resp 27 11/11/20 2045  SpO2 94 % 11/11/20 2045  Vitals shown include unvalidated device data.  Last Pain:  Vitals:   11/11/20 2035  TempSrc:   PainSc: 9          Complications: No complications documented.

## 2020-11-11 NOTE — ED Notes (Signed)
Patient placed on a Pure Wick. 

## 2020-11-11 NOTE — Anesthesia Postprocedure Evaluation (Signed)
Anesthesia Post Note  Patient: Ashby Leflore  Procedure(s) Performed: LAPAROSCOPY AND DRAINAGE OF BOWEL PERFORATION WITH OMENTAL GRAHAM PATCH FOR ULCER (N/A Abdomen) LAPAROSCOPIC LYSIS OF ADHESIONS (Abdomen)     Patient location during evaluation: PACU Anesthesia Type: General Level of consciousness: awake and alert Pain management: pain level controlled Vital Signs Assessment: post-procedure vital signs reviewed and stable Respiratory status: spontaneous breathing, nonlabored ventilation, respiratory function stable and non-rebreather facemask Cardiovascular status: blood pressure returned to baseline and stable Postop Assessment: no apparent nausea or vomiting Anesthetic complications: no   No complications documented.  Last Vitals:  Vitals:   11/11/20 2039 11/11/20 2100  BP: (!) 119/93 113/72  Pulse: 88 82  Resp: (!) 27 (!) 23  Temp: 36.7 C   SpO2: 97% 99%    Last Pain:  Vitals:   11/11/20 2039  TempSrc: Axillary  PainSc:                  Myron Stankovich,W. EDMOND

## 2020-11-11 NOTE — Op Note (Signed)
11/11/2020  7:53 PM  PATIENT:  Nunzio Cobbs  55 y.o. female  Patient Care Team: Harlan Stains, MD as PCP - General (Family Medicine) Kennith Center, RD as Dietitian (Family Medicine)  PRE-OPERATIVE DIAGNOSIS:  Pneumoperitoneum  POST-OPERATIVE DIAGNOSIS:   PERFORATED GASTROJEJUNAL ANASTOMOTIC ULCER HISTORY OF ROUX-N-Y GASTRIC BYPASS 2014  PROCEDURE:   LAPAROSCOPIC OMENTAL GRAHAM PATCH OF ULCER LAPAROSCOPIC LYSIS OF ADHESIONS  SURGEON:  Adin Hector, MD  ASSISTANT: OR Staff   ANESTHESIA:   local and general  EBL:  No intake/output data recorded.  Delay start of Pharmacological VTE agent (>24hrs) due to surgical blood loss or risk of bleeding:  no  DRAINS:  19 Fr Blake drain goes from left upper quadrant port site to LUQ & around omental patch repair  SPECIMEN:  No Specimen  DISPOSITION OF SPECIMEN:  N/A  COUNTS:  YES  PLAN OF CARE: Admit to inpatient   PATIENT DISPOSITION:  PACU - guarded condition.  INDICATION:   Patient with evidence of free air and perforation.  History of laparoscopic antecolic Roux-en-Y gastric bypass in 2014.  Had episodes of pain and probable perforation in 2017.  Found to have sealed off ulcer then.  Placed on antacid medication.  Somewhat lost to follow-up.  Had severe pain.  Recurrent free air in upper abdomen suspicious for recurrent perforated ulcer at gastrojejunal anastomosis.  Seen by Dr. Marlou Starks.  Surgery recommended.  I saw the patient & also recommended surgery:  The anatomy & physiology of the digestive tract was discussed.  The pathophysiology of perforation was discussed.  Differential diagnosis such as perforated ulcer or colon, etc was discussed.   Natural history risks without surgery such as death was discussed.  I recommended abdominal exploration to diagnose & treat the source of the problem.  Laparoscopic & open techniques were discussed.   Risks such as bleeding, infection, abscess, leak, reoperation, bowel resection,  possible ostomy, hernia, heart attack, death, and other risks were discussed.   The risks of no intervention will lead to serious problems including death.   I expressed a good likelihood that surgery will address the problem.    Goals of post-operative recovery were discussed as well.  We will work to minimize complications although risks in an emergent setting are high.   Questions were answered.  The patient expressed understanding & wishes to proceed with surgery.      OR FINDINGS: Moderate peritonitis in the upper abdomen with frothy exudate consistent with proximal perforation.  Moderate phlegmon.  Perforation on medial anterior gastrojejunal anastomosis partially sealed by left lateral sector of liver but still with moderate contamination.  Phlegmon spreading to small bowel and lower abdomen.  Omental patch done.  Antecolic gastrojejunal anastomosis.  Intact jejunojejunostomy without perforation.  No internal hernia.  No small bowel obstruction.  Enlarged colon diffusely with stretched out redundant sigmoid colon without volvulus.  CASE DATA:  Type of patient?: LDOW CASE (Surgical Hospitalist [facility] Inpatient)  Status of Case? EMERGENT Add On  Infection Present At Time Of Surgery (PATOS)?  PHLEGMON  DESCRIPTION:   Informed consent was confirmed.  The patient underwent general anaesthesia without difficulty.  The patient was positioned appropriately.  VTE prevention in place.  The patient's abdomen was clipped, prepped, & draped in a sterile fashion.  Surgical timeout confirmed our plan.  The patient was positioned in reverse Trendelenburg.  Abdominal entry with a 59mm port was gained using optical entry technique in the left upper abdomen.  Entry was clean.  I induced carbon dioxide insufflation.  Camera inspection revealed no injury.  Moderate peritonitis with phlegmon and frothy exudate and ascites.  Were seen to be in the upper abdomen suspicious for perforation at gastrojejunal anastomosis.   Extra ports were carefully placed under direct laparoscopic visualization.  Proceeded to aspirate the purulence and ascites.  Washed out with several liters to clean the area up.  Look down the abdomen.  While there is some phlegmon on the mid small bowel the pelvis was clear.  Redundant sigmoid colon showed no perforation arguing against any diverticulitis.  No obvious appendicitis.  No internal or strangulated bowel.  Return focused into the upper abdomen since this seemed to be the most obvious area.  Could find the antecolic gastrojejunal limb heading up to the left upper quadrant.  Dense phlegmon adherent to the anterior abdominal wall and left lateral sector of the liver seem to be the most focused area.  Carefully freed off the left lateral sector of the liver off the gastrojejunal anastomosis.  There was some omentum adherent to the lateral part of the gastrojejunal anastomosis.  No major inflammation there.  No peritonitis posteriorly.  Came around to the anterior midline part of the gastrojejunal anastomosis and found out a punched-out hole suspicious for the perforation.  I had anesthesia insufflate the nasogastric tube with oxygen and irrigated the upper abdomen.  There was bubbling coming from this hole confirming the area of perforation.  I found the ileocecal region and some ran the small bowel proximally until I found the jejunojejunostomy anastomosis.  Some phlegmon on it but no perforation.  Washed off.  I ran one limb back up to the gastrojejunostomy.  I again went back to the jejunojejunostomy and ran back proximally into the ligament of Treitz.  There was no internal hernia or surprise.  There is no frank evidence of appendicitis or diverticulitis.  No evidence of a perforated Meckel's diverticulum.  No evidence of any other pathology in the abdomen.  Return to the perforated ulcer.  I freed some greater omentum off of its attachments to the left upper quadrant and brought up a nice  healthy noninflamed tongue of omentum that easily could lay over the region.  I freed off the left lateral sector of the liver off the anterior stomach a little bit more.  I laid a large tongue of omentum over the perforation.  I then used 2-0 V lock absorbable serrated suture in a running horizontal mattress suturing fashion to create an omental patch starting superiorly.  Took healthy bite of jejunum, omentum and stomach superior to the perforation and then ran back in a horizontal mattress fashion part way.  I started another stitch inferiorly and ran up to meet in the middle.  Freed the stitches off.  This had a lifelong healthy tongue of omentum plugging up the perforation in the classic [facility] patch fashion.  We did copious irrigation and reinspection of the abdominal cavity. There were no more loculations.  Did a total of 7 L of irrigation.  Because this was a recurrent area with moderate phlegmon and some purulence, I left a drain to lay around just posterior and anterior to the omental patch with access corner going down in the retrosternal space.   We then evacuated carbon dioxide to remove the ports.  I closed the ports using Monocryl stitch a sterile dressings.   She required some low-dose pressors but did not have any major hemodynamic stability.  Despite being diagnosed Covid  positive she did not have any major pulmonary compromise.  Plan is to extubate & watch in the stepdown unit given her Covid positivity and peritonitis requiring emergency surgery.  I discussed operative findings, updated the patient's status, discussed probable steps to recovery, and gave postoperative recommendations to the patient's spouse, Sirinity Outland.   Recommendations were made.  Questions were answered.  He expressed understanding & appreciation.   Ardeth Sportsman, M.D., F.A.C.S. Gastrointestinal and Minimally Invasive Surgery [facility] [address], [address] [place], [place]  [id number] [phone] Main / Paging

## 2020-11-11 NOTE — ED Notes (Signed)
Patient is moaning in pain and was unable to keep her mouth closed for an oral temperature. I got a reading of 97.4 due to that. Temp was then done under her arm.

## 2020-11-11 NOTE — Interval H&P Note (Deleted)
History and Physical Interval Note:  11/11/2020 5:20 PM  Kimberly Stanley  has presented today for surgery, with the diagnosis of bowel perforation.  The various methods of treatment have been discussed with the patient and family. After consideration of risks, benefits and other options for treatment, the patient has consented to  Procedure(s): LAPAROSCOPY AND DRAINAGE OF BOWEL PERFORATION (N/A) as a surgical intervention.  The patient's history has been reviewed, patient examined, no change in status, stable for surgery.  I have reviewed the patient's chart and labs.  Questions were answered to the patient's satisfaction.     Ardeth Sportsman

## 2020-11-11 NOTE — Anesthesia Preprocedure Evaluation (Addendum)
Anesthesia Evaluation  Patient identified by MRN, date of birth, ID band Patient awake    Reviewed: Allergy & Precautions, H&P , NPO status , Patient's Chart, lab work & pertinent test results  Airway Mallampati: II  TM Distance: >3 FB Neck ROM: Full    Dental no notable dental hx. (+) Teeth Intact, Dental Advisory Given   Pulmonary neg pulmonary ROS, former smoker,    Pulmonary exam normal breath sounds clear to auscultation       Cardiovascular hypertension,  Rhythm:Regular Rate:Normal     Neuro/Psych Anxiety Depression negative neurological ROS     GI/Hepatic Neg liver ROS, PUD,   Endo/Other  Hypothyroidism   Renal/GU negative Renal ROS  negative genitourinary   Musculoskeletal   Abdominal   Peds  Hematology negative hematology ROS (+)   Anesthesia Other Findings   Reproductive/Obstetrics negative OB ROS                            Anesthesia Physical Anesthesia Plan  ASA: II  Anesthesia Plan: General   Post-op Pain Management:    Induction: Intravenous, Rapid sequence and Cricoid pressure planned  PONV Risk Score and Plan: 4 or greater and Ondansetron, Dexamethasone and Midazolam  Airway Management Planned: Oral ETT  Additional Equipment:   Intra-op Plan:   Post-operative Plan: Extubation in OR  Informed Consent: I have reviewed the patients History and Physical, chart, labs and discussed the procedure including the risks, benefits and alternatives for the proposed anesthesia with the patient or authorized representative who has indicated his/her understanding and acceptance.     Dental advisory given  Plan Discussed with: CRNA  Anesthesia Plan Comments:         Anesthesia Quick Evaluation

## 2020-11-11 NOTE — Anesthesia Procedure Notes (Signed)
Procedure Name: Intubation Date/Time: 11/11/2020 6:08 PM Performed by: Epimenio Sarin, CRNA Pre-anesthesia Checklist: Patient identified, Emergency Drugs available, Suction available, Patient being monitored and Timeout performed Patient Re-evaluated:Patient Re-evaluated prior to induction Oxygen Delivery Method: Circle system utilized Preoxygenation: Pre-oxygenation with 100% oxygen Induction Type: IV induction, Rapid sequence and Cricoid Pressure applied Laryngoscope Size: Glidescope and 4 Grade View: Grade I Tube type: Oral Tube size: 7.0 mm Number of attempts: 1 Airway Equipment and Method: Video-laryngoscopy and Rigid stylet Placement Confirmation: ETT inserted through vocal cords under direct vision,  positive ETCO2 and breath sounds checked- equal and bilateral Secured at: 21 cm Tube secured with: Tape Dental Injury: Teeth and Oropharynx as per pre-operative assessment  Comments: Elective glidescope r/t covid

## 2020-11-12 ENCOUNTER — Encounter (HOSPITAL_COMMUNITY): Payer: Self-pay | Admitting: Surgery

## 2020-11-12 ENCOUNTER — Inpatient Hospital Stay (HOSPITAL_COMMUNITY): Payer: BC Managed Care – PPO

## 2020-11-12 DIAGNOSIS — K285 Chronic or unspecified gastrojejunal ulcer with perforation: Principal | ICD-10-CM

## 2020-11-12 LAB — CBC WITH DIFFERENTIAL/PLATELET
Abs Immature Granulocytes: 0.01 10*3/uL (ref 0.00–0.07)
Basophils Absolute: 0 10*3/uL (ref 0.0–0.1)
Basophils Relative: 0 %
Eosinophils Absolute: 0 10*3/uL (ref 0.0–0.5)
Eosinophils Relative: 0 %
HCT: 23.1 % — ABNORMAL LOW (ref 36.0–46.0)
Hemoglobin: 7.7 g/dL — ABNORMAL LOW (ref 12.0–15.0)
Immature Granulocytes: 0 %
Lymphocytes Relative: 6 %
Lymphs Abs: 0.4 10*3/uL — ABNORMAL LOW (ref 0.7–4.0)
MCH: 32.9 pg (ref 26.0–34.0)
MCHC: 33.3 g/dL (ref 30.0–36.0)
MCV: 98.7 fL (ref 80.0–100.0)
Monocytes Absolute: 0.2 10*3/uL (ref 0.1–1.0)
Monocytes Relative: 3 %
Neutro Abs: 5.6 10*3/uL (ref 1.7–7.7)
Neutrophils Relative %: 91 %
Platelets: 114 10*3/uL — ABNORMAL LOW (ref 150–400)
RBC: 2.34 MIL/uL — ABNORMAL LOW (ref 3.87–5.11)
RDW: 12.4 % (ref 11.5–15.5)
WBC: 6.2 10*3/uL (ref 4.0–10.5)
nRBC: 0 % (ref 0.0–0.2)

## 2020-11-12 LAB — D-DIMER, QUANTITATIVE: D-Dimer, Quant: 0.66 ug/mL-FEU — ABNORMAL HIGH (ref 0.00–0.50)

## 2020-11-12 LAB — C-REACTIVE PROTEIN: CRP: 3.4 mg/dL — ABNORMAL HIGH (ref <1.0)

## 2020-11-12 LAB — HIV ANTIBODY (ROUTINE TESTING W REFLEX): HIV Screen 4th Generation wRfx: NONREACTIVE

## 2020-11-12 LAB — FERRITIN: Ferritin: 78 ng/mL (ref 11–307)

## 2020-11-12 LAB — MRSA PCR SCREENING: MRSA by PCR: NEGATIVE

## 2020-11-12 MED ORDER — DIPHENHYDRAMINE HCL 50 MG/ML IJ SOLN: 12.5000 mg | Freq: Four times a day (QID) | INTRAMUSCULAR | Status: DC | PRN

## 2020-11-12 MED ORDER — LORAZEPAM 2 MG/ML IJ SOLN
0.5000 mg | INTRAMUSCULAR | Status: DC | PRN
  Administered 2020-11-12 – 2020-11-14 (×6): 0.5 mg via INTRAVENOUS
  Filled 2020-11-12 (×6): qty 1

## 2020-11-12 MED ORDER — HYDROMORPHONE 1 MG/ML IV SOLN
INTRAVENOUS | Status: DC
  Administered 2020-11-12: 30 mg via INTRAVENOUS
  Administered 2020-11-13: 2.1 mg via INTRAVENOUS
  Administered 2020-11-13: 1.5 mg via INTRAVENOUS
  Administered 2020-11-13: 1.8 mg via INTRAVENOUS
  Administered 2020-11-14: 1.5 mg via INTRAVENOUS
  Administered 2020-11-14: 30 mg via INTRAVENOUS
  Administered 2020-11-14: 1.5 mg via INTRAVENOUS
  Administered 2020-11-14: 2.1 mg via INTRAVENOUS
  Administered 2020-11-15: 0 mg via INTRAVENOUS
  Administered 2020-11-15: 1.2 mg via INTRAVENOUS
  Administered 2020-11-15: 0.3 mg via INTRAVENOUS
  Filled 2020-11-12 (×2): qty 30

## 2020-11-12 MED ORDER — DIPHENHYDRAMINE HCL 12.5 MG/5ML PO ELIX: 12.5000 mg | ORAL_SOLUTION | Freq: Four times a day (QID) | ORAL | Status: DC | PRN

## 2020-11-12 MED ORDER — LORAZEPAM 2 MG/ML IJ SOLN
0.5000 mg | Freq: Four times a day (QID) | INTRAMUSCULAR | Status: DC | PRN
  Administered 2020-11-12: 0.5 mg via INTRAVENOUS
  Filled 2020-11-12: qty 1

## 2020-11-12 MED ORDER — ONDANSETRON HCL 4 MG/2ML IJ SOLN
4.0000 mg | Freq: Four times a day (QID) | INTRAMUSCULAR | Status: DC | PRN
  Administered 2020-11-14: 4 mg via INTRAVENOUS
  Filled 2020-11-12: qty 2

## 2020-11-12 MED ORDER — NALOXONE HCL 0.4 MG/ML IJ SOLN: 0.4000 mg | INTRAMUSCULAR | Status: DC | PRN

## 2020-11-12 MED ORDER — SODIUM CHLORIDE 0.9% FLUSH: 9.0000 mL | INTRAVENOUS | Status: DC | PRN

## 2020-11-12 NOTE — Progress Notes (Signed)
1 Day Post-Op   Subjective/Chief Complaint: Complains of ng irritating her throat. Less belly pain than yesterday   Objective: Vital signs in last 24 hours: Temp:  [97.7 F (36.5 C)-98.1 F (36.7 C)] 97.7 F (36.5 C) (01/06 0400) Pulse Rate:  [63-90] 79 (01/06 0600) Resp:  [13-31] 14 (01/06 0600) BP: (91-123)/(44-93) 117/65 (01/06 0600) SpO2:  [91 %-100 %] 95 % (01/06 0600) Weight:  [92.2 kg] 92.2 kg (01/05 2100) Last BM Date:  (PTA)  Intake/Output from previous day: 01/05 0701 - 01/06 0700 In: 3506.4 [I.V.:3029.7; IV Piggyback:476.7] Out: 865 [Urine:475; Drains:290; Blood:100] Intake/Output this shift: No intake/output data recorded.  General appearance: alert and cooperative Resp: clear to auscultation bilaterally Cardio: regular rate and rhythm GI: soft, significantly less tender than yesterday. drain output serosanguinous  Lab Results:  Recent Labs    11/11/20 1119 11/12/20 0333  WBC 6.1 6.2  HGB 14.5 7.7*  HCT 42.1 23.1*  PLT 218 114*   BMET Recent Labs    11/11/20 1119  NA 137  K 3.4*  CL 104  CO2 24  GLUCOSE 102*  BUN 11  CREATININE 0.73  CALCIUM 8.6*   PT/INR No results for input(s): LABPROT, INR in the last 72 hours. ABG No results for input(s): PHART, HCO3 in the last 72 hours.  Invalid input(s): PCO2, PO2  Studies/Results: DG Abd 1 View  Result Date: 11/12/2020 CLINICAL DATA:  Nasogastric tube placement EXAM: ABDOMEN - 1 VIEW COMPARISON:  None. FINDINGS: Nasogastric tube tip is seen just beyond the gastroesophageal junction with its proximal side hole positioned at the gastroesophageal junction itself. Multiple gas-filled loops of large and small bowel are seen within the visualized abdomen suggesting an underlying ileus. IMPRESSION: Nasogastric tube tip just beyond the gastroesophageal junction. Advancement by 5-10 cm may better position the catheter within the gastric lumen. Electronically Signed   By: Fidela Salisbury MD   On: 11/12/2020  04:57   DG Abd 1 View  Result Date: 11/11/2020 CLINICAL DATA:  Abdominal pain. EXAM: ABDOMEN - 1 VIEW COMPARISON:  None. FINDINGS: This examination is limited as the patient was unable to tolerate additional imaging secondary to pain. Only the upper abdomen is imaged. There is gaseous distention of the colon. Mild bibasilar subsegmental atelectasis in the chest noted. IMPRESSION: Markedly limited study as only the upper abdomen is visualized. Gaseous distention of the colon is seen. Electronically Signed   By: Inge Rise M.D.   On: 11/11/2020 12:11   CT ABDOMEN PELVIS W CONTRAST  Result Date: 11/11/2020 CLINICAL DATA:  Acute generalized abdominal pain. EXAM: CT ABDOMEN AND PELVIS WITH CONTRAST TECHNIQUE: Multidetector CT imaging of the abdomen and pelvis was performed using the standard protocol following bolus administration of intravenous contrast. CONTRAST:  151mL OMNIPAQUE IOHEXOL 300 MG/ML  SOLN COMPARISON:  September 23, 2015. FINDINGS: Lower chest: Mild bilateral posterior basilar subsegmental atelectasis or infiltrates are noted. Hepatobiliary: No focal liver abnormality is seen. Status post cholecystectomy. No biliary dilatation. Pancreas: Unremarkable. No pancreatic ductal dilatation or surrounding inflammatory changes. Spleen: Normal in size without focal abnormality. Adrenals/Urinary Tract: Adrenal glands are unremarkable. Kidneys are normal, without renal calculi, focal lesion, or hydronephrosis. Bladder is unremarkable. 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 However, there is pneumoperitoneum seen in the epigastric region as well as free air in the porta hepatis region and near the gastrojejunostomy site concerning for bowel perforation. There is no evidence of bowel obstruction or inflammation. The appendix is unremarkable. Vascular/Lymphatic: No significant vascular findings are  present. No enlarged abdominal or pelvic lymph nodes. Reproductive: Uterus  and bilateral adnexa are unremarkable. Other: Small fat containing bilateral inguinal hernias are noted. No ascites is noted. Musculoskeletal: No acute or significant osseous findings. IMPRESSION: 1. 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 However, there is pneumoperitoneum seen in the epigastric region as well as free air in the porta hepatis region and near the gastrojejunostomy site concerning for bowel perforation. Critical Value/emergent results were called by telephone at the time of interpretation on 11/11/2020 at 1:42 pm to provider [facility] , who verbally acknowledged these results. 2. Small fat containing bilateral inguinal hernias are noted. 3. Mild bilateral posterior basilar subsegmental atelectasis or infiltrates are noted. Electronically Signed   By: Lupita Raider M.D.   On: 11/11/2020 13:42   DG CHEST PORT 1 VIEW  Result Date: 11/11/2020 CLINICAL DATA:  Postop.  Abdominal pain. EXAM: PORTABLE CHEST 1 VIEW COMPARISON:  September 23, 2015 FINDINGS: The tip of the enteric tube terminates near the GE junction. There is a drain projecting over the left upper quadrant. The lung volumes are low. There are hazy bilateral airspace opacities. There is no pneumothorax. No large pleural effusion. The heart size is unremarkable. The previously demonstrated pneumoperitoneum is not well visualized on this study. IMPRESSION: 1. Enteric tube terminates near the GE junction. Repositioning should be considered, however the patient is status post prior gastric bypass and further advancement of the tube may be challenging. 2. Bibasilar airspace opacities are noted which may represent atelectasis or developing infiltrates. 3. There is a drain in the left upper quadrant. Electronically Signed   By: Katherine Mantle M.D.   On: 11/11/2020 21:08    Anti-infectives: Anti-infectives (From admission, onward)   Start     Dose/Rate Route Frequency Ordered Stop   11/12/20 1000  remdesivir 100 mg  in sodium chloride 0.9 % 100 mL IVPB       "Followed by" Linked Group Details   100 mg 200 mL/hr over 30 Minutes Intravenous Daily 11/11/20 1837 11/16/20 0959   11/11/20 2200  piperacillin-tazobactam (ZOSYN) IVPB 3.375 g        3.375 g 12.5 mL/hr over 240 Minutes Intravenous Every 8 hours 11/11/20 2044 11/16/20 2159   11/11/20 2000  remdesivir 200 mg in sodium chloride 0.9% 250 mL IVPB       "Followed by" Linked Group Details   200 mg 580 mL/hr over 30 Minutes Intravenous Once 11/11/20 1837 11/11/20 2137   11/11/20 1330  piperacillin-tazobactam (ZOSYN) IVPB 3.375 g        3.375 g 12.5 mL/hr over 240 Minutes Intravenous  Once 11/11/20 1327 11/11/20 1733      Assessment/Plan: s/p Procedure(s): LAPAROSCOPY AND DRAINAGE OF BOWEL PERFORATION WITH OMENTAL GRAHAM PATCH FOR ULCER (N/A) LAPAROSCOPIC LYSIS OF ADHESIONS Continue ng and bowel rest  Continue zosyn for bowel perf Continue remdesivir for Covid + pneumonia. Per medical team Continue drains Add pca for pain control VTE: scd's and lovenox  LOS: 1 day    Chevis Pretty III 11/12/2020

## 2020-11-12 NOTE — Plan of Care (Signed)
Ongoing attempts to motivate patient to participate with care plan and encourage movement to help progress post-operatively. Pt wearing 15L HFNC without obvious respiratory distress.  Problem: Education: Goal: Understanding of discharge needs will improve Outcome: Progressing Goal: Verbalization of understanding of the causes of altered bowel function will improve Outcome: Progressing   Problem: Activity: Goal: Ability to tolerate increased activity will improve Outcome: Progressing   Problem: Bowel/Gastric: Goal: Gastrointestinal status for postoperative course will improve Outcome: Progressing   Problem: Health Behavior/Discharge Planning: Goal: Identification of community resources to assist with postoperative recovery needs will improve Outcome: Progressing   Problem: Nutritional: Goal: Will attain and maintain optimal nutritional status will improve Outcome: Progressing   Problem: Clinical Measurements: Goal: Postoperative complications will be avoided or minimized Outcome: Progressing   Problem: Respiratory: Goal: Respiratory status will improve Outcome: Progressing   Problem: Skin Integrity: Goal: Will show signs of wound healing Outcome: Progressing   Problem: Education: Goal: Knowledge of risk factors and measures for prevention of condition will improve Outcome: Progressing   Problem: Coping: Goal: Psychosocial and spiritual needs will be supported Outcome: Progressing   Problem: Respiratory: Goal: Will maintain a patent airway Outcome: Progressing Goal: Complications related to the disease process, condition or treatment will be avoided or minimized Outcome: Progressing

## 2020-11-12 NOTE — Progress Notes (Signed)
PROGRESS NOTE Consult   Kimberly Stanley  [id number] DOB: Feb 17, 1966 DOA: 11/11/2020 PCP: Harlan Stains, MD  Brief Narrative:  Patient presented with peritonitis, found to be COVID positive. Admitted to the Surgical team. Underwent Surgical intervention for perforated bowel on 11/11/20.  Had dyspnea on presentation that has worsened post-operatively but patient reports not being able to take a deep breath due to abdominal pain post-operatively.  Patient on PCA pump for pain management.  Patient with long-standing history of anxiety and on xanax 1mg  po x4 daily at home. Started on Ativan 0.5mg  IV q4h prn for anxiety on 11/12/20. Remains NPO with NG tube in place.   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 NG tube in place. Continue pain management per primary team.   COVID 19 infection     - only symptom on presentation was cough and she didn't find it significant enough to attempt treatment at home     - she has atelectasis on the lung fields we can see from current imaging; will ordered dedicated CXR     - she was on 2L [place] on presentation with shallow breathing from abdominal pain as she will not take deep breathes on examination due to the pain.  11/12/20 Continue remdesivir, IS, and anti-tussives, follow inflammatory markers.  Mobilize as able and as recommended per primary team.    Post-op normocytic anemia Hgb of 14.5 on presentation but likely due to hemoconcentration with unknown baseline Hgb.  Hgb today of 7.7.  Will check anemia panel, would transfuse if Hgb less than 7.     Hypokalemia Repeat labs in am.    Anxiety  Started on Ativan 0.5mg  IV q4hr prn anxiety. She is on xanax 1mg  po x4/day at home.  May increase Ativan as needed but would be cautions due to current PCA pump use and possible worsening of he respiratory status.      DVT prophylaxis: Lovenox Code Status: Full Family Communication: None Disposition Plan: Pending post-op improvement. High risk of worsening respiratory status due to abdominal pain and shallow breathing with COVID+ status.     Procedures:  Laparoscopy and drainage of bowel perforation with omental patch and lysis of adhesions on 11/11/20 by Dr. Michael Boston.   Antimicrobials:  Zosyn  Subjective: Patient tearful, reported that she still have abdominal discomfort and anxiety but had some improvement with the medications.   Objective: Vitals:   11/12/20 1700 11/12/20 1800 11/12/20 1900 11/12/20 2000  BP: 107/69 106/71 107/71   Pulse: 78 81 83   Resp: 13 13 13  (!) 24  Temp:      TempSrc:      SpO2: 96% 96% 97% 97%  Weight:      Height:        Intake/Output Summary (Last 24 hours) at 11/12/2020 2021 Last data filed at 11/12/2020 1900 Gross per 24 hour  Intake 3839.78 ml  Output 540 ml  Net 3299.78 ml   Filed Weights   11/11/20 2100  Weight: 92.2 kg    Examination:  General exam: mild distress due to abdominal pain, resting in bed.  Respiratory  system: No cough, normal effort.  Cardiovascular system: S1 & S2 present. RRR.  Gastrointestinal system: Abdomen tender throughout, mildly distended.  Central nervous system: Alert and oriented. No focal neurological deficits. Extremities: Symmetric 5 x 5 power. Skin: No rashes, lesions or ulcers per limited exam.  Psychiatry: Labile mood, anxious.    Data Reviewed: I have personally reviewed following labs and imaging studies  CBC: Recent Labs  Lab 11/11/20 1119 11/12/20 0333  WBC 6.1 6.2  NEUTROABS 4.6 5.6  HGB 14.5 7.7*  HCT 42.1 23.1*  MCV 94.6 98.7  PLT 218 [id number]*   Basic Metabolic  Panel: Recent Labs  Lab 11/11/20 1119 11/11/20 2110  NA 137  --   K 3.4*  --   CL 104  --   CO2 24  --   GLUCOSE 102*  --   BUN 11  --   CREATININE 0.73  --   CALCIUM 8.6*  --   MG  --  1.9   GFR: Estimated Creatinine Clearance: 92.3 mL/min (by C-G formula based on SCr of 0.73 mg/dL). Liver Function Tests: Recent Labs  Lab 11/11/20 1119  AST 26  ALT 28  ALKPHOS 65  BILITOT 0.5  PROT 6.5  ALBUMIN 3.8   Recent Labs  Lab 11/11/20 1119  LIPASE 24   No results for input(s): AMMONIA in the last 168 hours. Coagulation Profile: No results for input(s): INR, PROTIME in the last 168 hours. Cardiac Enzymes: No results for input(s): CKTOTAL, CKMB, CKMBINDEX, TROPONINI in the last 168 hours. BNP (last 3 results) No results for input(s): PROBNP in the last 8760 hours. HbA1C: No results for input(s): HGBA1C in the last 72 hours. CBG: No results for input(s): GLUCAP in the last 168 hours. Lipid Profile: No results for input(s): CHOL, HDL, LDLCALC, TRIG, CHOLHDL, LDLDIRECT in the last 72 hours. Thyroid Function Tests: No results for input(s): TSH, T4TOTAL, FREET4, T3FREE, THYROIDAB in the last 72 hours. Anemia Panel: Recent Labs    11/12/20 0333  FERRITIN 78   Sepsis Labs: Recent Labs  Lab 11/11/20 1120  LATICACIDVEN 1.1    Recent Results (from the past 240 hour(s))  Resp Panel by RT-PCR (Flu A&B, Covid) Nasopharyngeal Swab     Status: Abnormal   Collection Time: 11/11/20 11:24 AM   Specimen: Nasopharyngeal Swab; Nasopharyngeal(NP) swabs in vial transport medium  Result Value Ref Range Status   SARS Coronavirus 2 by RT PCR POSITIVE (A) NEGATIVE Final    Comment: RESULT CALLED TO, READ BACK BY AND VERIFIED WITH: Elayne Guerin 11/11/20 @1327  BY SEEL,MOLLY (NOTE) SARS-CoV-2 target nucleic acids are DETECTED.  The SARS-CoV-2 RNA is generally detectable in upper respiratory specimens during the acute phase of infection. Positive results are indicative of the  presence of the identified virus, but do not rule out bacterial infection or co-infection with other pathogens not detected by the test. Clinical correlation with patient history and other diagnostic information is necessary to determine patient infection status. The expected result is Negative.  Fact Sheet for Patients: EntrepreneurPulse.com.au  Fact Sheet for Healthcare Providers: IncredibleEmployment.be  This test is not yet approved or cleared by the [place] FDA and  has been authorized for detection and/or diagnosis of SARS-CoV-2 by FDA under an Emergency Use Authorization (EUA).  This EUA will remain in effect (meaning this te st can be used) for the duration of  the COVID-19 declaration under Section 564(b)(1) of the Act, 21 U.S.C. section 360bbb-3(b)(1), unless the authorization is terminated or revoked sooner.  Influenza A by PCR NEGATIVE NEGATIVE Final   Influenza B by PCR NEGATIVE NEGATIVE Final    Comment: (NOTE) The Xpert Xpress SARS-CoV-2/FLU/RSV plus assay is intended as an aid in the diagnosis of influenza from Nasopharyngeal swab specimens and should not be used as a sole basis for treatment. Nasal washings and aspirates are unacceptable for Xpert Xpress SARS-CoV-2/FLU/RSV testing.  Fact Sheet for Patients: EntrepreneurPulse.com.au  Fact Sheet for Healthcare Providers: IncredibleEmployment.be  This test is not yet approved or cleared by the [place] FDA and has been authorized for detection and/or diagnosis of SARS-CoV-2 by FDA under an Emergency Use Authorization (EUA). This EUA will remain in effect (meaning this test can be used) for the duration of the COVID-19 declaration under Section 564(b)(1) of the Act, 21 U.S.C. section 360bbb-3(b)(1), unless the authorization is terminated or revoked.  Performed at [facility], [place] [address], [place], [place] [address]   MRSA PCR Screening     Status: None   Collection Time: 11/11/20 10:00 PM   Specimen: Nasal Mucosa; Nasopharyngeal  Result Value Ref Range Status   MRSA by PCR NEGATIVE NEGATIVE Final    Comment:        The GeneXpert MRSA Assay (FDA approved for NASAL specimens only), is one component of a comprehensive MRSA colonization surveillance program. It is not intended to diagnose MRSA infection nor to guide or monitor treatment for MRSA infections. Performed at [facility], [place] [address], [place], [place] [address]          Radiology Studies: DG Abd 1 View  Result Date: 11/12/2020 CLINICAL DATA:  Nasogastric tube placement EXAM: ABDOMEN - 1 VIEW COMPARISON:  None. FINDINGS: Nasogastric tube tip is seen just beyond the gastroesophageal junction with its proximal side hole positioned at the gastroesophageal junction itself. Multiple gas-filled loops of large and small bowel are seen within the visualized abdomen suggesting an underlying ileus. IMPRESSION: Nasogastric tube tip just beyond the gastroesophageal junction. Advancement by 5-10 cm may better position the catheter within the gastric lumen. Electronically Signed   By: Fidela Salisbury MD   On: 11/12/2020 04:57   DG Abd 1 View  Result Date: 11/11/2020 CLINICAL DATA:  Abdominal pain. EXAM: ABDOMEN - 1 VIEW COMPARISON:  None. FINDINGS: This examination is limited as the patient was unable to tolerate additional imaging secondary to pain. Only the upper abdomen is imaged. There is gaseous distention of the colon. Mild bibasilar subsegmental atelectasis in the chest noted. IMPRESSION: Markedly limited study as only the upper abdomen is visualized. Gaseous distention of the colon is seen. Electronically Signed   By: Inge Rise M.D.   On: 11/11/2020 12:11   CT ABDOMEN PELVIS W CONTRAST  Result Date: 11/11/2020 CLINICAL DATA:  Acute generalized abdominal pain. EXAM: CT ABDOMEN AND PELVIS  WITH CONTRAST TECHNIQUE: Multidetector CT imaging of the abdomen and pelvis was performed using the standard protocol following bolus administration of intravenous contrast. CONTRAST:  153mL OMNIPAQUE IOHEXOL 300 MG/ML  SOLN COMPARISON:  September 23, 2015. FINDINGS: Lower chest: Mild bilateral posterior basilar subsegmental atelectasis or infiltrates are noted. Hepatobiliary: No focal liver abnormality is seen. Status post cholecystectomy. No biliary dilatation. Pancreas: Unremarkable. No pancreatic ductal dilatation or surrounding inflammatory changes. Spleen: Normal in size without focal abnormality. Adrenals/Urinary Tract: Adrenal glands are unremarkable. Kidneys are normal, without renal calculi, focal lesion, or hydronephrosis. Bladder is unremarkable. 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 However, there is pneumoperitoneum seen in the epigastric region  as well as free air in the porta hepatis region and near the gastrojejunostomy site concerning for bowel perforation. There is no evidence of bowel obstruction or inflammation. The appendix is unremarkable. Vascular/Lymphatic: No significant vascular findings are present. No enlarged abdominal or pelvic lymph nodes. Reproductive: Uterus and bilateral adnexa are unremarkable. Other: Small fat containing bilateral inguinal hernias are noted. No ascites is noted. Musculoskeletal: No acute or significant osseous findings. IMPRESSION: 1. 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 However, there is pneumoperitoneum seen in the epigastric region as well as free air in the porta hepatis region and near the gastrojejunostomy site concerning for bowel perforation. Critical Value/emergent results were called by telephone at the time of interpretation on 11/11/2020 at 1:42 pm to provider [facility] , who verbally acknowledged these results. 2. Small fat containing bilateral inguinal hernias are noted. 3. Mild bilateral  posterior basilar subsegmental atelectasis or infiltrates are noted. Electronically Signed   By: Lupita Raider M.D.   On: 11/11/2020 13:42   DG CHEST PORT 1 VIEW  Result Date: 11/11/2020 CLINICAL DATA:  Postop.  Abdominal pain. EXAM: PORTABLE CHEST 1 VIEW COMPARISON:  September 23, 2015 FINDINGS: The tip of the enteric tube terminates near the GE junction. There is a drain projecting over the left upper quadrant. The lung volumes are low. There are hazy bilateral airspace opacities. There is no pneumothorax. No large pleural effusion. The heart size is unremarkable. The previously demonstrated pneumoperitoneum is not well visualized on this study. IMPRESSION: 1. Enteric tube terminates near the GE junction. Repositioning should be considered, however the patient is status post prior gastric bypass and further advancement of the tube may be challenging. 2. Bibasilar airspace opacities are noted which may represent atelectasis or developing infiltrates. 3. There is a drain in the left upper quadrant. Electronically Signed   By: Katherine Mantle M.D.   On: 11/11/2020 21:08        Scheduled Meds: . vitamin C  500 mg Oral Daily  . chlorhexidine  15 mL Mouth Rinse BID  . Chlorhexidine Gluconate Cloth  6 each Topical Daily  . enoxaparin (LOVENOX) injection  40 mg Subcutaneous Q24H  . HYDROmorphone   Intravenous Q4H  . lip balm  1 application Topical BID  . mouth rinse  15 mL Mouth Rinse q12n4p  . [START ON 11/15/2020] pantoprazole  40 mg Intravenous Q12H  . sodium chloride flush  3 mL Intravenous Q12H  . zinc sulfate  220 mg Oral Daily   Continuous Infusions: . sodium chloride Stopped (11/12/20 0415)  . sodium chloride 250 mL (11/12/20 1956)  . lactated ringers Stopped (11/12/20 1640)  . methocarbamol (ROBAXIN) IV    . pantoprozole (PROTONIX) infusion 8 mg/hr (11/12/20 1900)  . piperacillin-tazobactam (ZOSYN)  IV Stopped (11/12/20 1854)  . remdesivir 100 mg in NS 100 mL Stopped (11/12/20 1136)      LOS: 1 day    Time spent:  35 minutes     Ky Barban, MD [facility]   If 7PM-7AM, please contact night-coverage [email or web address] Password [email or web address] 11/12/2020, 8:21 PM

## 2020-11-12 NOTE — Progress Notes (Signed)
Communicated with general surgeon on call Dr. Dwain Sarna to inquire if NG tube can be pulled since she has not had output in 24 hours since surgery. Dr. Dwain Sarna replied that she must keep NG in place.

## 2020-11-13 LAB — H PYLORI, IGM, IGG, IGA AB
H Pylori IgG: 0.14 Index Value (ref 0.00–0.79)
H. Pylogi, Iga Abs: <9 units (ref 0.0–8.9)
H. Pylogi, Igm Abs: <9 units (ref 0.0–8.9)

## 2020-11-13 LAB — CBC WITH DIFFERENTIAL/PLATELET
Abs Immature Granulocytes: 0.05 10*3/uL (ref 0.00–0.07)
Basophils Absolute: 0 10*3/uL (ref 0.0–0.1)
Basophils Relative: 0 %
Eosinophils Absolute: 0 10*3/uL (ref 0.0–0.5)
Eosinophils Relative: 0 %
HCT: 36.8 % (ref 36.0–46.0)
Hemoglobin: 11.8 g/dL — ABNORMAL LOW (ref 12.0–15.0)
Immature Granulocytes: 1 %
Lymphocytes Relative: 11 %
Lymphs Abs: 1 10*3/uL (ref 0.7–4.0)
MCH: 31.9 pg (ref 26.0–34.0)
MCHC: 32.1 g/dL (ref 30.0–36.0)
MCV: 99.5 fL (ref 80.0–100.0)
Monocytes Absolute: 0.4 10*3/uL (ref 0.1–1.0)
Monocytes Relative: 4 %
Neutro Abs: 7.9 10*3/uL — ABNORMAL HIGH (ref 1.7–7.7)
Neutrophils Relative %: 84 %
Platelets: 175 10*3/uL (ref 150–400)
RBC: 3.7 MIL/uL — ABNORMAL LOW (ref 3.87–5.11)
RDW: 12.6 % (ref 11.5–15.5)
WBC: 9.3 10*3/uL (ref 4.0–10.5)
nRBC: 0 % (ref 0.0–0.2)

## 2020-11-13 LAB — COMPREHENSIVE METABOLIC PANEL
ALT: 47 U/L — ABNORMAL HIGH (ref 0–44)
AST: 27 U/L (ref 15–41)
Albumin: 2.7 g/dL — ABNORMAL LOW (ref 3.5–5.0)
Alkaline Phosphatase: 50 U/L (ref 38–126)
Anion gap: 9 (ref 5–15)
BUN: 10 mg/dL (ref 6–20)
CO2: 23 mmol/L (ref 22–32)
Calcium: 8 mg/dL — ABNORMAL LOW (ref 8.9–10.3)
Chloride: 109 mmol/L (ref 98–111)
Creatinine, Ser: 0.48 mg/dL (ref 0.44–1.00)
GFR, Estimated: >60 mL/min (ref >60)
Glucose, Bld: 108 mg/dL — ABNORMAL HIGH (ref 70–99)
Potassium: 3.2 mmol/L — ABNORMAL LOW (ref 3.5–5.1)
Sodium: 141 mmol/L (ref 135–145)
Total Bilirubin: 0.3 mg/dL (ref 0.3–1.2)
Total Protein: 5.1 g/dL — ABNORMAL LOW (ref 6.5–8.1)

## 2020-11-13 LAB — RETICULOCYTES
Immature Retic Fract: 8.3 % (ref 2.3–15.9)
RBC.: 3.73 MIL/uL — ABNORMAL LOW (ref 3.87–5.11)
Retic Count, Absolute: 29.5 10*3/uL (ref 19.0–186.0)
Retic Ct Pct: 0.8 % (ref 0.4–3.1)

## 2020-11-13 LAB — AMYLASE, PLEURAL OR PERITONEAL FLUID: Amylase, Fluid: 214 U/L

## 2020-11-13 LAB — IRON AND TIBC
Iron: 24 ug/dL — ABNORMAL LOW (ref 28–170)
Saturation Ratios: 12 % (ref 10.4–31.8)
TIBC: 208 ug/dL — ABNORMAL LOW (ref 250–450)
UIBC: 184 ug/dL

## 2020-11-13 LAB — PHOSPHORUS: Phosphorus: 1.9 mg/dL — ABNORMAL LOW (ref 2.5–4.6)

## 2020-11-13 LAB — FERRITIN: Ferritin: 173 ng/mL (ref 11–307)

## 2020-11-13 LAB — AMYLASE: Amylase: 35 U/L (ref 28–100)

## 2020-11-13 LAB — FOLATE: Folate: 19.8 ng/mL (ref >5.9)

## 2020-11-13 LAB — VITAMIN B12: Vitamin B-12: 659 pg/mL (ref 180–914)

## 2020-11-13 LAB — C-REACTIVE PROTEIN: CRP: 16.8 mg/dL — ABNORMAL HIGH (ref <1.0)

## 2020-11-13 LAB — MAGNESIUM: Magnesium: 1.9 mg/dL (ref 1.7–2.4)

## 2020-11-13 MED ORDER — POTASSIUM CHLORIDE 10 MEQ/100ML IV SOLN
10.0000 meq | INTRAVENOUS | Status: AC
  Administered 2020-11-13 (×3): 10 meq via INTRAVENOUS
  Filled 2020-11-13 (×3): qty 100

## 2020-11-13 MED ORDER — METHOCARBAMOL 1000 MG/10ML IJ SOLN
1000.0000 mg | Freq: Three times a day (TID) | INTRAVENOUS | Status: DC
  Administered 2020-11-13 – 2020-11-18 (×17): 1000 mg via INTRAVENOUS
  Filled 2020-11-13: qty 10
  Filled 2020-11-13 (×5): qty 1000
  Filled 2020-11-13: qty 10
  Filled 2020-11-13 (×6): qty 1000
  Filled 2020-11-13: qty 10
  Filled 2020-11-13 (×2): qty 1000
  Filled 2020-11-13 (×3): qty 10
  Filled 2020-11-13: qty 1000
  Filled 2020-11-13: qty 10

## 2020-11-13 MED ORDER — HEPARIN SODIUM (PORCINE) 5000 UNIT/ML IJ SOLN
5000.0000 [IU] | Freq: Three times a day (TID) | INTRAMUSCULAR | Status: DC
  Administered 2020-11-13 – 2020-11-19 (×18): 5000 [IU] via SUBCUTANEOUS
  Filled 2020-11-13 (×16): qty 1

## 2020-11-13 MED ORDER — ACETAMINOPHEN 10 MG/ML IV SOLN
1000.0000 mg | Freq: Four times a day (QID) | INTRAVENOUS | Status: AC
  Administered 2020-11-13 – 2020-11-14 (×4): 1000 mg via INTRAVENOUS
  Filled 2020-11-13 (×4): qty 100

## 2020-11-13 MED ORDER — POTASSIUM PHOSPHATES 15 MMOLE/5ML IV SOLN
30.0000 mmol | Freq: Once | INTRAVENOUS | Status: AC
  Administered 2020-11-13: 30 mmol via INTRAVENOUS
  Filled 2020-11-13: qty 10

## 2020-11-13 MED ORDER — POTASSIUM CHLORIDE 10 MEQ/100ML IV SOLN: 10.0000 meq | INTRAVENOUS | Status: DC

## 2020-11-13 MED ORDER — POTASSIUM PHOSPHATES 15 MMOLE/5ML IV SOLN: 20.0000 mmol | Freq: Once | INTRAVENOUS | Status: DC

## 2020-11-13 MED ORDER — PHENOL 1.4 % MT LIQD: 1.0000 | OROMUCOSAL | Status: DC | PRN

## 2020-11-13 MED ORDER — LACTATED RINGERS IV BOLUS: 1000.0000 mL | Freq: Three times a day (TID) | INTRAVENOUS | Status: AC | PRN

## 2020-11-13 NOTE — Progress Notes (Signed)
Progress Note  2 Days Post-Op  Subjective: Patient very anxious and crying. Discomfort from NGT. Reports abdominal pain and pain in throat. Has not been out of bed.   Objective: Vital signs in last 24 hours: Temp:  [97.5 F (36.4 C)-98.1 F (36.7 C)] 97.5 F (36.4 C) (01/07 0820) Pulse Rate:  [74-93] 86 (01/07 0700) Resp:  [11-24] 21 (01/07 0813) BP: (97-121)/(52-89) 102/52 (01/07 0700) SpO2:  [89 %-99 %] 89 % (01/07 0813) FiO2 (%):  [0 %] 0 % (01/07 0813) Last BM Date:  (PTA)  Intake/Output from previous day: 01/06 0701 - 01/07 0700 In: 2424.7 [I.V.:2101.3; IV Piggyback:323.4] Out: 120 [Urine:75; Drains:45] Intake/Output this shift: No intake/output data recorded.  PE: General: WD, overweight female who is laying in bed and tearful Heart: sinus tachycardia  Lungs: tahcypnea Abd: soft, appropriately ttp, ND, incisions c/d/i, drain SS Psych: Anxious.    Lab Results:  Recent Labs    11/12/20 0333 11/13/20 0316  WBC 6.2 9.3  HGB 7.7* 11.8*  HCT 23.1* 36.8  PLT 114* 175   BMET Recent Labs    11/11/20 1119 11/13/20 0316  NA 137 141  K 3.4* 3.2*  CL 104 109  CO2 24 23  GLUCOSE 102* 108*  BUN 11 10  CREATININE 0.73 0.48  CALCIUM 8.6* 8.0*   PT/INR No results for input(s): LABPROT, INR in the last 72 hours. CMP     Component Value Date/Time   NA 141 11/13/2020 0316   K 3.2 (L) 11/13/2020 0316   CL 109 11/13/2020 0316   CO2 23 11/13/2020 0316   GLUCOSE 108 (H) 11/13/2020 0316   BUN 10 11/13/2020 0316   CREATININE 0.48 11/13/2020 0316   CREATININE 0.73 05/16/2014 0943   CALCIUM 8.0 (L) 11/13/2020 0316   PROT 5.1 (L) 11/13/2020 0316   ALBUMIN 2.7 (L) 11/13/2020 0316   AST 27 11/13/2020 0316   ALT 47 (H) 11/13/2020 0316   ALKPHOS 50 11/13/2020 0316   BILITOT 0.3 11/13/2020 0316   GFRNONAA >60 11/13/2020 0316   GFRAA >60 09/27/2015 0608   Lipase     Component Value Date/Time   LIPASE 24 11/11/2020 1119       Studies/Results: DG Abd 1  View  Result Date: 11/12/2020 CLINICAL DATA:  Nasogastric tube placement EXAM: ABDOMEN - 1 VIEW COMPARISON:  None. FINDINGS: Nasogastric tube tip is seen just beyond the gastroesophageal junction with its proximal side hole positioned at the gastroesophageal junction itself. Multiple gas-filled loops of large and small bowel are seen within the visualized abdomen suggesting an underlying ileus. IMPRESSION: Nasogastric tube tip just beyond the gastroesophageal junction. Advancement by 5-10 cm may better position the catheter within the gastric lumen. Electronically Signed   By: Fidela Salisbury MD   On: 11/12/2020 04:57   DG Abd 1 View  Result Date: 11/11/2020 CLINICAL DATA:  Abdominal pain. EXAM: ABDOMEN - 1 VIEW COMPARISON:  None. FINDINGS: This examination is limited as the patient was unable to tolerate additional imaging secondary to pain. Only the upper abdomen is imaged. There is gaseous distention of the colon. Mild bibasilar subsegmental atelectasis in the chest noted. IMPRESSION: Markedly limited study as only the upper abdomen is visualized. Gaseous distention of the colon is seen. Electronically Signed   By: Inge Rise M.D.   On: 11/11/2020 12:11   CT ABDOMEN PELVIS W CONTRAST  Result Date: 11/11/2020 CLINICAL DATA:  Acute generalized abdominal pain. EXAM: CT ABDOMEN AND PELVIS WITH CONTRAST TECHNIQUE: Multidetector CT imaging  of the abdomen and pelvis was performed using the standard protocol following bolus administration of intravenous contrast. CONTRAST:  161mL OMNIPAQUE IOHEXOL 300 MG/ML  SOLN COMPARISON:  September 23, 2015. FINDINGS: Lower chest: Mild bilateral posterior basilar subsegmental atelectasis or infiltrates are noted. Hepatobiliary: No focal liver abnormality is seen. Status post cholecystectomy. No biliary dilatation. Pancreas: Unremarkable. No pancreatic ductal dilatation or surrounding inflammatory changes. Spleen: Normal in size without focal abnormality. Adrenals/Urinary  Tract: Adrenal glands are unremarkable. Kidneys are normal, without renal calculi, focal lesion, or hydronephrosis. Bladder is unremarkable. 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 However, there is pneumoperitoneum seen in the epigastric region as well as free air in the porta hepatis region and near the gastrojejunostomy site concerning for bowel perforation. There is no evidence of bowel obstruction or inflammation. The appendix is unremarkable. Vascular/Lymphatic: No significant vascular findings are present. No enlarged abdominal or pelvic lymph nodes. Reproductive: Uterus and bilateral adnexa are unremarkable. Other: Small fat containing bilateral inguinal hernias are noted. No ascites is noted. Musculoskeletal: No acute or significant osseous findings. IMPRESSION: 1. 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 However, there is pneumoperitoneum seen in the epigastric region as well as free air in the porta hepatis region and near the gastrojejunostomy site concerning for bowel perforation. Critical Value/emergent results were called by telephone at the time of interpretation on 11/11/2020 at 1:42 pm to provider [facility] , who verbally acknowledged these results. 2. Small fat containing bilateral inguinal hernias are noted. 3. Mild bilateral posterior basilar subsegmental atelectasis or infiltrates are noted. Electronically Signed   By: Marijo Conception M.D.   On: 11/11/2020 13:42   DG CHEST PORT 1 VIEW  Result Date: 11/11/2020 CLINICAL DATA:  Postop.  Abdominal pain. EXAM: PORTABLE CHEST 1 VIEW COMPARISON:  September 23, 2015 FINDINGS: The tip of the enteric tube terminates near the GE junction. There is a drain projecting over the left upper quadrant. The lung volumes are low. There are hazy bilateral airspace opacities. There is no pneumothorax. No large pleural effusion. The heart size is unremarkable. The previously demonstrated pneumoperitoneum is  not well visualized on this study. IMPRESSION: 1. Enteric tube terminates near the GE junction. Repositioning should be considered, however the patient is status post prior gastric bypass and further advancement of the tube may be challenging. 2. Bibasilar airspace opacities are noted which may represent atelectasis or developing infiltrates. 3. There is a drain in the left upper quadrant. Electronically Signed   By: Constance Holster M.D.   On: 11/11/2020 21:08    Anti-infectives: Anti-infectives (From admission, onward)   Start     Dose/Rate Route Frequency Ordered Stop   11/12/20 1000  remdesivir 100 mg in sodium chloride 0.9 % 100 mL IVPB       "Followed by" Linked Group Details   100 mg 200 mL/hr over 30 Minutes Intravenous Daily 11/11/20 1837 11/16/20 0959   11/11/20 2200  piperacillin-tazobactam (ZOSYN) IVPB 3.375 g        3.375 g 12.5 mL/hr over 240 Minutes Intravenous Every 8 hours 11/11/20 2044 11/16/20 2159   11/11/20 2000  remdesivir 200 mg in sodium chloride 0.9% 250 mL IVPB       "Followed by" Linked Group Details   200 mg 580 mL/hr over 30 Minutes Intravenous Once 11/11/20 1837 11/11/20 2137   11/11/20 1330  piperacillin-tazobactam (ZOSYN) IVPB 3.375 g        3.375 g 12.5 mL/hr over 240 Minutes Intravenous  Once 11/11/20  1327 11/11/20 1733       Assessment/Plan COVID - 19 infection - TRH to consult  HTN Hypothyroidism  Depression  Anxiety  ABL anemia - hgb 11.8 this AM  Hx of Roux-en Y gastric bypass 2014 Hx of perforated marginal ulcer at [place] with diagnostic laparoscopy and drainage of collection 2016 Dr. Hassell Done Perforation at [place] anastomosis S/p laparoscopic graham patch with drain placement 11/13/20 Dr. Johney Maine - POD#2 - H. Pylori still pending  - needs to mobilize - drain with SS fluid - NGT malpositioned - will discuss possible removal with strict NPO with MD today - continue PPI gtt - Will discuss with radiology if possible to get UGI over the weekend to  evaluate repair   FEN: NPO, IVF VTE: start SQ heparin today  ID: Zosyn 1/5>>  LOS: 2 days    Norm Parcel , [facility] Surgery 11/13/2020, 8:31 AM Please see Amion for pager number during day hours 7:00am-4:30pm

## 2020-11-13 NOTE — Progress Notes (Signed)
Attempted to put NG tube twice. Both times pt was given Ativan IV PRN to help with anxiety. During both attempts, pt was tense, labored breathing, and tachypnea. Pt expressed that she did not want tube in and that she felt like she was choking. I made attempts to calm pt down during procedure with therapeutic touch and talking to her. Were unsuccessful in both attempts to get NG tube in. Surgery notified. Will continue to monitor.

## 2020-11-13 NOTE — Progress Notes (Signed)
Unable to convince patient to get OOB to chair this morning. Patient trying to cough out NG tube even after teaching that it is from nare to stomach and needs to remain until surgeon orders it removed. Patient held her breath and oxygen saturation went down to 80's. Reinforced teaching about COVID, oxygen, and pursed-lip breathing to ease anxiety. Did not want to medicate for anxiety at this time so that physicians can assess her better. Spent time coaching her through anxiety instead.

## 2020-11-13 NOTE — Progress Notes (Signed)
Husband called around 0500 for update and I advised I was in another room and would need to call back. Able to call back at about 0540. Provided update and reassurance. Will pass on request for MD to call him later about her condition. Encouraged either E-link virtual visit or face-time on her phone later today. Husband reports he is also COVID positive, but doing okay at home. He understands that virtual visits are only type possible at this point because of his covid status.

## 2020-11-13 NOTE — Progress Notes (Addendum)
PROGRESS NOTE Consult   Kimberly Stanley  KVQ:[id number] DOB: December 12, 1965 DOA: 11/11/2020 PCP: Harlan Stains, MD  Brief Narrative:  Patient presented with peritonitis, found to be COVID positive. Admitted to the Surgical team. Underwent Surgical intervention for perforated bowel on 11/11/20.  Had dyspnea on presentation that has worsened post-operatively but patient reports not being able to take a deep breath due to abdominal pain post-operatively.  Patient on PCA pump for pain management.  Patient with long-standing history of anxiety and on xanax 1mg  po x4 daily at home. Started on Ativan 0.5mg  IV q4h prn for anxiety on 11/12/20. Remains NPO with NG tube in place.   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 NG tube in place. Continue pain management per primary team.  11/13/20 Patient to continue NG tube and to remain NPO for meds. Her pain is reported as better controlled. Patient encouraged to sit up, ambulate.   COVID 19 infection Acute hypoxic respiratory failure      - only symptom on presentation was cough and she didn't find it significant enough to attempt treatment at home     - she has atelectasis on the lung fields we can see from current imaging; will ordered dedicated CXR     - she was on 2L [place] on presentation with shallow breathing from abdominal pain as she will not take deep breathes on examination due to the pain.  11/12/20 Continue remdesivir, IS, and anti-tussives, follow inflammatory markers.  Mobilize as able and as recommended per primary team.  11/13/20 Patient encouraged to use IS, to sit up, and to take deep  breaths using the pillow technique.   Post-op normocytic anemia Hgb of 14.5 on presentation but likely due to hemoconcentration with unknown baseline Hgb.  Hgb today of 7.7.  Will check anemia panel, would transfuse if Hgb less than 7.  11/13/20 hgb improved overnight, question possible labs error yesterday. Iron low but ferritin borderline normal. Vitamin B12 and folate normal. Will continue to monitor.    Hypokalemia Repeat labs in am.  11/13/20 recurrent, ordered repletion.   Low phosphorus Ordered repletion, repeat labs in am.   Anxiety  Started on Ativan 0.5mg  IV q4hr prn anxiety. She is on xanax 1mg  po x4/day at home.  May increase Ativan as needed but would be cautions due to current PCA pump use and possible worsening of he respiratory status. 11/13/20 Mood has improved this afternoon. Will continue Ativan IV as needed.       DVT prophylaxis: Lovenox Code Status: Full Family Communication: None Disposition Plan: Pending post-op improvement. High risk of worsening respiratory status due to abdominal pain and shallow breathing with COVID+ status.     Procedures:  Laparoscopy and drainage of bowel perforation with omental patch and lysis of adhesions on 11/11/20 by Dr. Michael Boston.   Antimicrobials:  Zosyn  Subjective: Patient with improvement of her pain and anxiety. Willing to try to sit up this afternoon. Reports no BM in days, requesting suppository which is already ordered as prn. Discussed with bedside nurse. Patient remains on O2 supplementation with occasional cough, no distress  but reports being hesitant to take a deep breath due to her abdominal pain. Patient willing to try pillow technique to take deep breaths and cough.   Objective: Vitals:   11/13/20 1335 11/13/20 1400 11/13/20 1600 11/13/20 1639  BP:  (!) 88/53 92/72   Pulse: 72 75 80   Resp: 15 15 20 15   Temp:      TempSrc:      SpO2: 90% 90% (!) 88% 90%  Weight:      Height:        Intake/Output  Summary (Last 24 hours) at 11/13/2020 1720 Last data filed at 11/13/2020 1648 Gross per 24 hour  Intake 3243.49 ml  Output 320 ml  Net 2923.49 ml   Filed Weights   11/11/20 2100  Weight: 92.2 kg    Examination:  General exam: no distress, resting in bed.  Respiratory system: No cough, normal effort.  Cardiovascular system: S1 & S2 present. RRR.  Gastrointestinal system: Abdomen tender throughout, mildly distended.  Central nervous system: Alert and oriented. No focal neurological deficits. Extremities: Symmetric 5 x 5 power. Skin: No rashes, lesions or ulcers per limited exam.  Psychiatry: Appropriate mood and affect.    Data Reviewed: I have personally reviewed following labs  CBC: Recent Labs  Lab 11/11/20 1119 11/12/20 0333 11/13/20 0316  WBC 6.1 6.2 9.3  NEUTROABS 4.6 5.6 7.9*  HGB 14.5 7.7* 11.8*  HCT 42.1 23.1* 36.8  MCV 94.6 98.7 99.5  PLT 218 114* 562   Basic Metabolic Panel: Recent Labs  Lab 11/11/20 1119 11/11/20 2110 11/13/20 0316  NA 137  --  141  K 3.4*  --  3.2*  CL 104  --  109  CO2 24  --  23  GLUCOSE 102*  --  108*  BUN 11  --  10  CREATININE 0.73  --  0.48  CALCIUM 8.6*  --  8.0*  MG  --  1.9 1.9  PHOS  --   --  1.9*   GFR: Estimated Creatinine Clearance: 92.3 mL/min (by C-G formula based on SCr of 0.48 mg/dL). Liver Function Tests: Recent Labs  Lab 11/11/20 1119 11/13/20 0316  AST 26 27  ALT 28 47*  ALKPHOS 65 50  BILITOT 0.5 0.3  PROT 6.5 5.1*  ALBUMIN 3.8 2.7*   Recent Labs  Lab 11/11/20 1119 11/13/20 0316  LIPASE 24  --   AMYLASE  --  35   No results for input(s): AMMONIA in the last 168 hours. Coagulation Profile: No results for input(s): INR, PROTIME in the last 168 hours. Cardiac Enzymes: No results for input(s): CKTOTAL, CKMB, CKMBINDEX, TROPONINI in the last 168 hours. BNP (last 3 results) No results for input(s): PROBNP in the last 8760 hours. HbA1C: No results for input(s): HGBA1C in the last 72  hours. CBG: No results for input(s): GLUCAP in the last 168 hours. Lipid Profile: No results for input(s): CHOL, HDL, LDLCALC, TRIG, CHOLHDL, LDLDIRECT in the last 72 hours. Thyroid Function Tests: No results for input(s): TSH, T4TOTAL, FREET4, T3FREE, THYROIDAB in the last 72 hours. Anemia Panel: Recent Labs    11/12/20 0333 11/13/20 0316  VITAMINB12  --  659  FOLATE  --  19.8  FERRITIN 78 173  TIBC  --  208*  IRON  --  24*  RETICCTPCT  --  0.8   Sepsis Labs: Recent Labs  Lab 11/11/20 1120  LATICACIDVEN 1.1    Recent Results (from the past 240 hour(s))  Resp Panel by  RT-PCR (Flu A&B, Covid) Nasopharyngeal Swab     Status: Abnormal   Collection Time: 11/11/20 11:24 AM   Specimen: Nasopharyngeal Swab; Nasopharyngeal(NP) swabs in vial transport medium  Result Value Ref Range Status   SARS Coronavirus 2 by RT PCR POSITIVE (A) NEGATIVE Final    Comment: RESULT CALLED TO, READ BACK BY AND VERIFIED WITH: Elayne Guerin 11/11/20 @1327  BY SEEL,MOLLY (NOTE) SARS-CoV-2 target nucleic acids are DETECTED.  The SARS-CoV-2 RNA is generally detectable in upper respiratory specimens during the acute phase of infection. Positive results are indicative of the presence of the identified virus, but do not rule out bacterial infection or co-infection with other pathogens not detected by the test. Clinical correlation with patient history and other diagnostic information is necessary to determine patient infection status. The expected result is Negative.  Fact Sheet for Patients: EntrepreneurPulse.com.au  Fact Sheet for Healthcare Providers: IncredibleEmployment.be  This test is not yet approved or cleared by the [place] FDA and  has been authorized for detection and/or diagnosis of SARS-CoV-2 by FDA under an Emergency Use Authorization (EUA).  This EUA will remain in effect (meaning this te st can be used) for the duration of  the COVID-19  declaration under Section 564(b)(1) of the Act, 21 U.S.C. section 360bbb-3(b)(1), unless the authorization is terminated or revoked sooner.     Influenza A by PCR NEGATIVE NEGATIVE Final   Influenza B by PCR NEGATIVE NEGATIVE Final    Comment: (NOTE) The Xpert Xpress SARS-CoV-2/FLU/RSV plus assay is intended as an aid in the diagnosis of influenza from Nasopharyngeal swab specimens and should not be used as a sole basis for treatment. Nasal washings and aspirates are unacceptable for Xpert Xpress SARS-CoV-2/FLU/RSV testing.  Fact Sheet for Patients: EntrepreneurPulse.com.au  Fact Sheet for Healthcare Providers: IncredibleEmployment.be  This test is not yet approved or cleared by the [place] FDA and has been authorized for detection and/or diagnosis of SARS-CoV-2 by FDA under an Emergency Use Authorization (EUA). This EUA will remain in effect (meaning this test can be used) for the duration of the COVID-19 declaration under Section 564(b)(1) of the Act, 21 U.S.C. section 360bbb-3(b)(1), unless the authorization is terminated or revoked.  Performed at [facility], [place] [address], [place], [place] [address]   MRSA PCR Screening     Status: None   Collection Time: 11/11/20 10:00 PM   Specimen: Nasal Mucosa; Nasopharyngeal  Result Value Ref Range Status   MRSA by PCR NEGATIVE NEGATIVE Final    Comment:        The GeneXpert MRSA Assay (FDA approved for NASAL specimens only), is one component of a comprehensive MRSA colonization surveillance program. It is not intended to diagnose MRSA infection nor to guide or monitor treatment for MRSA infections. Performed at [facility], [place] [address] [place], [place] [address]          Radiology Studies: DG Abd 1 View  Result Date: 11/12/2020 CLINICAL DATA:  Nasogastric tube placement EXAM: ABDOMEN - 1 VIEW COMPARISON:  None. FINDINGS: Nasogastric  tube tip is seen just beyond the gastroesophageal junction with its proximal side hole positioned at the gastroesophageal junction itself. Multiple gas-filled loops of large and small bowel are seen within the visualized abdomen suggesting an underlying ileus. IMPRESSION: Nasogastric tube tip just beyond the gastroesophageal junction. Advancement by 5-10 cm may better position the catheter within the gastric lumen. Electronically Signed   By: Fidela Salisbury MD   On: 11/12/2020 04:57  DG CHEST PORT 1 VIEW  Result Date: 11/11/2020 CLINICAL DATA:  Postop.  Abdominal pain. EXAM: PORTABLE CHEST 1 VIEW COMPARISON:  September 23, 2015 FINDINGS: The tip of the enteric tube terminates near the GE junction. There is a drain projecting over the left upper quadrant. The lung volumes are low. There are hazy bilateral airspace opacities. There is no pneumothorax. No large pleural effusion. The heart size is unremarkable. The previously demonstrated pneumoperitoneum is not well visualized on this study. IMPRESSION: 1. Enteric tube terminates near the GE junction. Repositioning should be considered, however the patient is status post prior gastric bypass and further advancement of the tube may be challenging. 2. Bibasilar airspace opacities are noted which may represent atelectasis or developing infiltrates. 3. There is a drain in the left upper quadrant. Electronically Signed   By: Constance Holster M.D.   On: 11/11/2020 21:08        Scheduled Meds: . chlorhexidine  15 mL Mouth Rinse BID  . Chlorhexidine Gluconate Cloth  6 each Topical Daily  . heparin injection (subcutaneous)  5,000 Units Subcutaneous Q8H  . HYDROmorphone   Intravenous Q4H  . lip balm  1 application Topical BID  . mouth rinse  15 mL Mouth Rinse q12n4p  . [START ON 11/15/2020] pantoprazole  40 mg Intravenous Q12H  . sodium chloride flush  3 mL Intravenous Q12H   Continuous Infusions: . sodium chloride Stopped (11/13/20 1639)  . acetaminophen  Stopped (11/13/20 1227)  . lactated ringers    . lactated ringers 50 mL/hr at 11/13/20 1430  . methocarbamol (ROBAXIN) IV Stopped (11/13/20 1713)  . pantoprozole (PROTONIX) infusion 8 mg/hr (11/13/20 1648)  . piperacillin-tazobactam (ZOSYN)  IV 12.5 mL/hr at 11/13/20 1648  . remdesivir 100 mg in NS 100 mL Stopped (11/13/20 1248)     LOS: 2 days    Time spent:  35 minutes   Blain Pais, MD [facility]   If 7PM-7AM, please contact night-coverage [email or web address] Password [email or web address] 11/13/2020, 5:20 PM

## 2020-11-14 DIAGNOSIS — J9601 Acute respiratory failure with hypoxia: Secondary | ICD-10-CM

## 2020-11-14 DIAGNOSIS — E876 Hypokalemia: Secondary | ICD-10-CM

## 2020-11-14 DIAGNOSIS — J1282 Pneumonia due to coronavirus disease 2019: Secondary | ICD-10-CM

## 2020-11-14 DIAGNOSIS — U071 COVID-19: Secondary | ICD-10-CM

## 2020-11-14 LAB — COMPREHENSIVE METABOLIC PANEL
ALT: 32 U/L (ref 0–44)
AST: 17 U/L (ref 15–41)
Albumin: 2.8 g/dL — ABNORMAL LOW (ref 3.5–5.0)
Alkaline Phosphatase: 59 U/L (ref 38–126)
Anion gap: 10 (ref 5–15)
BUN: 9 mg/dL (ref 6–20)
CO2: 25 mmol/L (ref 22–32)
Calcium: 8.4 mg/dL — ABNORMAL LOW (ref 8.9–10.3)
Chloride: 106 mmol/L (ref 98–111)
Creatinine, Ser: 0.5 mg/dL (ref 0.44–1.00)
GFR, Estimated: >60 mL/min (ref >60)
Glucose, Bld: 94 mg/dL (ref 70–99)
Potassium: 3.3 mmol/L — ABNORMAL LOW (ref 3.5–5.1)
Sodium: 141 mmol/L (ref 135–145)
Total Bilirubin: 0.6 mg/dL (ref 0.3–1.2)
Total Protein: 5.5 g/dL — ABNORMAL LOW (ref 6.5–8.1)

## 2020-11-14 LAB — CBC WITH DIFFERENTIAL/PLATELET
Abs Immature Granulocytes: 0.03 10*3/uL (ref 0.00–0.07)
Basophils Absolute: 0 10*3/uL (ref 0.0–0.1)
Basophils Relative: 0 %
Eosinophils Absolute: 0.1 10*3/uL (ref 0.0–0.5)
Eosinophils Relative: 1 %
HCT: 34.2 % — ABNORMAL LOW (ref 36.0–46.0)
Hemoglobin: 11.2 g/dL — ABNORMAL LOW (ref 12.0–15.0)
Immature Granulocytes: 0 %
Lymphocytes Relative: 14 %
Lymphs Abs: 1.1 10*3/uL (ref 0.7–4.0)
MCH: 32 pg (ref 26.0–34.0)
MCHC: 32.7 g/dL (ref 30.0–36.0)
MCV: 97.7 fL (ref 80.0–100.0)
Monocytes Absolute: 0.3 10*3/uL (ref 0.1–1.0)
Monocytes Relative: 4 %
Neutro Abs: 6.3 10*3/uL (ref 1.7–7.7)
Neutrophils Relative %: 81 %
Platelets: 203 10*3/uL (ref 150–400)
RBC: 3.5 MIL/uL — ABNORMAL LOW (ref 3.87–5.11)
RDW: 12.4 % (ref 11.5–15.5)
WBC: 7.8 10*3/uL (ref 4.0–10.5)
nRBC: 0 % (ref 0.0–0.2)

## 2020-11-14 LAB — MAGNESIUM: Magnesium: 1.9 mg/dL (ref 1.7–2.4)

## 2020-11-14 LAB — C-REACTIVE PROTEIN: CRP: 17.3 mg/dL — ABNORMAL HIGH (ref <1.0)

## 2020-11-14 LAB — FERRITIN: Ferritin: 175 ng/mL (ref 11–307)

## 2020-11-14 LAB — PHOSPHORUS: Phosphorus: 2.2 mg/dL — ABNORMAL LOW (ref 2.5–4.6)

## 2020-11-14 MED ORDER — POTASSIUM CHLORIDE 10 MEQ/100ML IV SOLN
10.0000 meq | INTRAVENOUS | Status: AC
  Administered 2020-11-14 (×4): 10 meq via INTRAVENOUS
  Filled 2020-11-14 (×4): qty 100

## 2020-11-14 MED ORDER — MAGNESIUM SULFATE IN D5W 1-5 GM/100ML-% IV SOLN
1.0000 g | Freq: Once | INTRAVENOUS | Status: AC
  Administered 2020-11-14: 1 g via INTRAVENOUS
  Filled 2020-11-14: qty 100

## 2020-11-14 MED ORDER — POTASSIUM PHOSPHATES 15 MMOLE/5ML IV SOLN
30.0000 mmol | Freq: Once | INTRAVENOUS | Status: AC
  Administered 2020-11-14: 30 mmol via INTRAVENOUS
  Filled 2020-11-14: qty 10

## 2020-11-14 NOTE — Plan of Care (Signed)
  Problem: Education: Goal: Understanding of discharge needs will improve Outcome: Progressing Goal: Verbalization of understanding of the causes of altered bowel function will improve Outcome: Progressing   Problem: Activity: Goal: Ability to tolerate increased activity will improve Outcome: Progressing   Problem: Bowel/Gastric: Goal: Gastrointestinal status for postoperative course will improve Outcome: Progressing   Problem: Health Behavior/Discharge Planning: Goal: Identification of community resources to assist with postoperative recovery needs will improve Outcome: Progressing   Problem: Nutritional: Goal: Will attain and maintain optimal nutritional status will improve Outcome: Progressing   Problem: Clinical Measurements: Goal: Postoperative complications will be avoided or minimized Outcome: Progressing   Problem: Respiratory: Goal: Respiratory status will improve Outcome: Progressing   Problem: Skin Integrity: Goal: Will show signs of wound healing Outcome: Progressing   Problem: Education: Goal: Knowledge of risk factors and measures for prevention of condition will improve Outcome: Progressing   Problem: Coping: Goal: Psychosocial and spiritual needs will be supported Outcome: Progressing   Problem: Respiratory: Goal: Will maintain a patent airway Outcome: Progressing Goal: Complications related to the disease process, condition or treatment will be avoided or minimized Outcome: Progressing   

## 2020-11-14 NOTE — Progress Notes (Signed)
PROGRESS NOTE Consult   Kimberly Stanley  [id number] DOB: [birth year] DOA: 11/11/2020 PCP: Harlan Stains, MD  Brief Narrative:  -Patient presented with peritonitis, found to be COVID positive. Admitted to the Surgical team. Underwent Surgical intervention for perforated bowel on 11/11/20.  -Had dyspnea on presentation that has worsened post-operatively but patient reports not being able to take a deep breath due to abdominal pain post-operatively.  -Patient on PCA pump for pain management.  Patient with long-standing history of anxiety and on xanax 1mg  po x4 daily at home. Started on Ativan 0.5mg  IV q4h prn for anxiety on 11/12/20. Remains NPO with NG tube in place.  11/14/2020: Patient seen alongside patient's nurse.  Patient is currently on 8 L of supplemental oxygen with O2 sat of 94 to 95%.  We will continue to wean down supplemental oxygen to keep O2 sat equal to or greater than 91%.  Patient is currently on IV remdesivir, started on 0 11/12/2020.  Patient has no new complaints today.  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 NG tube in place. Continue pain management per primary team.  11/13/20 Patient to continue NG tube and to remain NPO for meds. Her pain is reported as better controlled. Patient encouraged to sit up, ambulate.   COVID 19 infection Acute hypoxic respiratory failure      - only symptom on presentation was cough and she didn't find it significant enough to attempt  treatment at home     - she has atelectasis on the lung fields we can see from current imaging; will ordered dedicated CXR     - she was on 2L [place] on presentation with shallow breathing from abdominal pain as she will not take deep breathes on examination due to the pain.  11/12/20 Continue remdesivir, IS, and anti-tussives, follow inflammatory markers.  Mobilize as able and as recommended per primary team.  11/13/20 Patient encouraged to use IS, to sit up, and to take deep breaths using the pillow technique. 11/14/2020: Continue to wean down supplemental oxygen.  Complete course of remdesivir.  Post-op normocytic anemia Hgb of 14.5 on presentation but likely due to hemoconcentration with unknown baseline Hgb.  Hgb today of 7.7.  Will check anemia panel, would transfuse if Hgb less than 7.  11/13/20 hgb improved overnight, question possible labs error yesterday. Iron low but ferritin borderline normal. Vitamin B12 and folate normal. Will continue to monitor.    Hypokalemia Potassium is 3.3 today.  Magnesium is 1.9. We will continue to replete.   Low phosphorus Phosphorus is 2.2.  DVT prophylaxis: Lovenox Code Status: Full Family Communication: None Disposition Plan: Pending post-op improvement. High risk of worsening respiratory status due to abdominal pain and shallow breathing with COVID+ status.     Procedures:  Laparoscopy and drainage of bowel perforation with omental patch and lysis of adhesions on 11/11/20 by Dr. Michael Boston.   Antimicrobials:  Zosyn  Subjective: No new complaints. Continue to  wean down supplemental oxygen.  Objective: Vitals:   11/14/20 0600 11/14/20 0800 11/14/20 1000 11/14/20 1100  BP: 110/68 122/78 125/70   Pulse: 79 73 78 81  Resp: 18 18 13 19   Temp:  97.7 F (36.5 C)    TempSrc:  Axillary    SpO2: (!) 87% 95% 94% 92%  Weight:      Height:        Intake/Output Summary (Last 24 hours) at 11/14/2020 1142 Last data filed at 11/14/2020 1004 Gross per  24 hour  Intake 2528.33 ml  Output 930 ml  Net 1598.33 ml   Filed Weights   11/11/20 2100  Weight: 92.2 kg    Examination:  General exam: no distress, resting in bed.  Respiratory system: Decreased air entry. Cardiovascular system: S1 & S2  Gastrointestinal system: Abdomen is obese, nontender, with drain.  Organs are difficult to assess. Central nervous system: Alert and oriented. No focal neurological deficits. Extremities: No leg edema  Data Reviewed: I have personally reviewed following labs  CBC: Recent Labs  Lab 11/11/20 1119 11/12/20 0333 11/13/20 0316 11/14/20 0756  WBC 6.1 6.2 9.3 7.8  NEUTROABS 4.6 5.6 7.9* 6.3  HGB 14.5 7.7* 11.8* 11.2*  HCT 42.1 23.1* 36.8 34.2*  MCV 94.6 98.7 99.5 97.7  PLT 218 114* 175 [id number]   Basic Metabolic Panel: Recent Labs  Lab 11/11/20 1119 11/11/20 2110 11/13/20 0316 11/14/20 0756  NA 137  --  141 141  K 3.4*  --  3.2* 3.3*  CL 104  --  109 106  CO2 24  --  23 25  GLUCOSE 102*  --  108* 94  BUN 11  --  10 9  CREATININE 0.73  --  0.48 0.50  CALCIUM 8.6*  --  8.0* 8.4*  MG  --  1.9 1.9 1.9  PHOS  --   --  1.9* 2.2*   GFR: Estimated Creatinine Clearance: 92.3 mL/min (by C-G formula based on SCr of 0.5 mg/dL). Liver Function Tests: Recent Labs  Lab 11/11/20 1119 11/13/20 0316 11/14/20 0756  AST 26 27 17   ALT 28 47* 32  ALKPHOS 65 50 59  BILITOT 0.5 0.3 0.6  PROT 6.5 5.1* 5.5*  ALBUMIN 3.8 2.7* 2.8*   Recent Labs  Lab 11/11/20 1119 11/13/20 0316  LIPASE 24  --   AMYLASE  --  35   No results for input(s): AMMONIA in the last 168 hours. Coagulation Profile: No results for input(s): INR, PROTIME in the last 168 hours. Cardiac Enzymes: No results for input(s): CKTOTAL, CKMB, CKMBINDEX, TROPONINI in the last 168 hours. BNP (last 3 results) No results for input(s): PROBNP in the last 8760 hours. HbA1C: No results for input(s): HGBA1C in the last 72 hours. CBG: No results for input(s): GLUCAP in the last 168  hours. Lipid Profile: No results for input(s): CHOL, HDL, LDLCALC, TRIG, CHOLHDL, LDLDIRECT in the last 72 hours. Thyroid Function Tests: No results for input(s): TSH, T4TOTAL, FREET4, T3FREE, THYROIDAB in the last 72 hours. Anemia Panel: Recent Labs    11/13/20 0316 11/14/20 0756  VITAMINB12 659  --   FOLATE 19.8  --   FERRITIN 173 175  TIBC 208*  --   IRON 24*  --   RETICCTPCT 0.8  --    Sepsis Labs: Recent Labs  Lab 11/11/20 1120  LATICACIDVEN 1.1    Recent Results (from the past 240 hour(s))  Resp Panel by RT-PCR (Flu A&B, Covid) Nasopharyngeal Swab  Status: Abnormal   Collection Time: 11/11/20 11:24 AM   Specimen: Nasopharyngeal Swab; Nasopharyngeal(NP) swabs in vial transport medium  Result Value Ref Range Status   SARS Coronavirus 2 by RT PCR POSITIVE (A) NEGATIVE Final    Comment: RESULT CALLED TO, READ BACK BY AND VERIFIED WITH: Elayne Guerin 11/11/20 @1327  BY SEEL,MOLLY (NOTE) SARS-CoV-2 target nucleic acids are DETECTED.  The SARS-CoV-2 RNA is generally detectable in upper respiratory specimens during the acute phase of infection. Positive results are indicative of the presence of the identified virus, but do not rule out bacterial infection or co-infection with other pathogens not detected by the test. Clinical correlation with patient history and other diagnostic information is necessary to determine patient infection status. The expected result is Negative.  Fact Sheet for Patients: EntrepreneurPulse.com.au  Fact Sheet for Healthcare Providers: IncredibleEmployment.be  This test is not yet approved or cleared by the [place] FDA and  has been authorized for detection and/or diagnosis of SARS-CoV-2 by FDA under an Emergency Use Authorization (EUA).  This EUA will remain in effect (meaning this te st can be used) for the duration of  the COVID-19 declaration under Section 564(b)(1) of the Act, 21 U.S.C.  section 360bbb-3(b)(1), unless the authorization is terminated or revoked sooner.     Influenza A by PCR NEGATIVE NEGATIVE Final   Influenza B by PCR NEGATIVE NEGATIVE Final    Comment: (NOTE) The Xpert Xpress SARS-CoV-2/FLU/RSV plus assay is intended as an aid in the diagnosis of influenza from Nasopharyngeal swab specimens and should not be used as a sole basis for treatment. Nasal washings and aspirates are unacceptable for Xpert Xpress SARS-CoV-2/FLU/RSV testing.  Fact Sheet for Patients: EntrepreneurPulse.com.au  Fact Sheet for Healthcare Providers: IncredibleEmployment.be  This test is not yet approved or cleared by the [place] FDA and has been authorized for detection and/or diagnosis of SARS-CoV-2 by FDA under an Emergency Use Authorization (EUA). This EUA will remain in effect (meaning this test can be used) for the duration of the COVID-19 declaration under Section 564(b)(1) of the Act, 21 U.S.C. section 360bbb-3(b)(1), unless the authorization is terminated or revoked.  Performed at [facility], [place] [address] [place], [place] [address]   MRSA PCR Screening     Status: None   Collection Time: 11/11/20 10:00 PM   Specimen: Nasal Mucosa; Nasopharyngeal  Result Value Ref Range Status   MRSA by PCR NEGATIVE NEGATIVE Final    Comment:        The GeneXpert MRSA Assay (FDA approved for NASAL specimens only), is one component of a comprehensive MRSA colonization surveillance program. It is not intended to diagnose MRSA infection nor to guide or monitor treatment for MRSA infections. Performed at [facility], [place] [address], [place], [place] [address]          Radiology Studies: No results found.      Scheduled Meds: . chlorhexidine  15 mL Mouth Rinse BID  . Chlorhexidine Gluconate Cloth  6 each Topical Daily  . heparin injection (subcutaneous)  5,000 Units Subcutaneous  Q8H  . HYDROmorphone   Intravenous Q4H  . lip balm  1 application Topical BID  . mouth rinse  15 mL Mouth Rinse q12n4p  . [START ON 11/15/2020] pantoprazole  40 mg Intravenous Q12H  . sodium chloride flush  3 mL Intravenous Q12H   Continuous Infusions: . sodium chloride Stopped (11/14/20 1002)  . lactated ringers    . lactated ringers 50 mL/hr at 11/14/20 1113  .  methocarbamol (ROBAXIN) IV Stopped (11/14/20 0539)  . pantoprozole (PROTONIX) infusion 8 mg/hr (11/14/20 1004)  . piperacillin-tazobactam (ZOSYN)  IV Stopped (11/14/20 0913)  . potassium PHOSPHATE IVPB (in mmol) 85 mL/hr at 11/14/20 1004  . remdesivir 100 mg in NS 100 mL Stopped (11/14/20 1032)     LOS: 3 days    Time spent:  35 minutes   Bonnell Public, MD [facility]   If 7PM-7AM, please contact night-coverage [email or web address] Password [facility] 11/14/2020, 11:42 AM

## 2020-11-14 NOTE — Progress Notes (Signed)
I updated the patient's status to the patient's spouse, Emelly Wurtz.  X 20 min.  Recommendations were made.  Questions were answered.  He expressed understanding & appreciation.  Adin Hector, MD, FACS, MASCRS Gastrointestinal and Minimally Invasive Surgery  [facility] [address] [place], [place] [address] [phone] Fax [phone] Main/Paging  CONTACT INFORMATION: Weekday (9AM-5PM) concerns: Call [facility] main office at [phone] Weeknight (5PM-9AM) or Weekend/Holiday concerns: Check [email or web address] for General Surgery [facility] coverage (Please, do not use SecureChat as it is not reliable communication to operating surgeons for immediate patient care)

## 2020-11-14 NOTE — Progress Notes (Addendum)
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 11/11/2020 Active Problems:   Pneumonia due to COVID-19 virus   HTN (hypertension)   Anxiety and depression   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   Intra-abdominal free air of unknown etiology   Hypokalemia   Hypophosphatemia   Acute respiratory failure with hypoxia ([place])   3 Days Post-Op  11/11/2020  POST-OPERATIVE DIAGNOSIS:   PERFORATED GASTROJEJUNAL ANASTOMOTIC ULCER HISTORY OF ROUX-N-Y GASTRIC BYPASS 2014  PROCEDURE:   LAPAROSCOPIC OMENTAL GRAHAM PATCH OF ULCER LAPAROSCOPIC LYSIS OF ADHESIONS  SURGEON:  Adin Hector, MD    Assessment  Recovering  [facility] Stay = 3 days)  Plan:  COVID - 19 infection - TRH to consult - Tx  HTN Hypothyroidism  Depression  Anxiety  ABL anemia - hgb 11.8 this AM  Hx of Roux-en Y gastric bypass 2014 Hx of perforated marginal ulcer at [place] with diagnostic laparoscopy and drainage of collection 2016 Dr. Hassell Done Perforation at [place] anastomosis S/p laparoscopic graham patch with drain placement 11/13/20 Dr. Johney Maine - POD#3 - H. Pylori - negative  - needs to mobilize - drain with SS fluid -drain amylase 10 times higher. Suspicious for small residual leak. Low output and clinically improving. Keep for now and recheck in 48 hours. - NGT malpositioned and removed. Patient cannot  tolerate replacement. Try and hold off for now since clinically improving - continue PPI -should finish continuous after 72 hours and then transition to IV every 12 hours per protocol  - If continues to clinically improve and drain amylase WNL on Monday, plan esophagram on Monday -Consider weaning off PCA tomorrow if continues to improve -Okay to transfer to floor from surgery standpoint. Will defer to medicine given her Covid positivity  FEN: NPO, IVF. Hypokalemia and hypophosphatemia. Replace. VTE: SQ heparin  ID: Zosyn 1/5>>  Disposition:  The patient is from: Home  Anticipate discharge to:  Home  Anticipated Date of Discharge is:  November 21, 2020  Barriers to discharge:  Pending Clinical improvement (more likely than not  Patient currently is NOT MEDICALLY STABLE for discharge from the hospital from a surgery standpoint.   25 minutes spent in review, evaluation, examination, counseling, and coordination of care.  More than 50% of that time was spent in counseling.  11/14/2020    Subjective: (Chief complaint)  Not able to replace NG tube due to patient intolerance. Held off.  ICU nursing in room.  Patient breathing okay.  Patient remains anxious and concerned.  Patient wished to explain how she ran out of antiacid meds & could not get them refilled. No nausea vomiting or retching.  Objective:  Vital signs:  Vitals:   11/14/20 0311 11/14/20 0400 11/14/20 0500 11/14/20 0600  BP:  (!) 107/59 106/81 110/68  Pulse:  72 74 79  Resp: 15 (!) 21 13 18   Temp:  97.7 F (36.5 C)    TempSrc:  Axillary  SpO2: 94% (!) 88% 92% (!) 87%  Weight:      Height:        Last BM Date:  (PTA)  Intake/Output   Yesterday:  01/07 0701 - 01/08 0700 In: 2199.1 [I.V.:758.6; IV Piggyback:1440.5] Out: 930 [Urine:920; Drains:10] This shift:  No intake/output data recorded.  Bowel function:  Flatus: YES  BM:  No  Drain: Serosanguinous   Physical Exam:  General: Pt  awake/alert in no acute distress. Not toxic at this time Eyes: PERRL, normal EOM.  Sclera clear.  No icterus Neuro: CN II-XII intact w/o focal sensory/motor deficits. Lymph: No head/neck/groin lymphadenopathy Psych:  No delerium/psychosis/paranoia.  Oriented x 4. Very anxious but consolable. HENT: Normocephalic, Mucus membranes moist.  No thrush Neck: Supple, No tracheal deviation.  No obvious thyromegaly Chest: No pain to chest wall compression.  Good respiratory excursion.  No audible wheezing CV:  Pulses intact.  Regular rhythm.  No major extremity edema MS: Normal AROM mjr joints.  No obvious deformity  Abdomen: Soft.  Mildy distended.  Mildly tender at incisions only.  Mild peritonitis - improved.  No incarcerated hernias.  Ext:  No deformity.  No mjr edema.  No cyanosis Skin: No petechiae / purpurea.  No major sores.  Warm and dry    Results:   Cultures: Recent Results (from the past 720 hour(s))  Resp Panel by RT-PCR (Flu A&B, Covid) Nasopharyngeal Swab     Status: Abnormal   Collection Time: 11/11/20 11:24 AM   Specimen: Nasopharyngeal Swab; Nasopharyngeal(NP) swabs in vial transport medium  Result Value Ref Range Status   SARS Coronavirus 2 by RT PCR POSITIVE (A) NEGATIVE Final    Comment: RESULT CALLED TO, READ BACK BY AND VERIFIED WITH: Elayne Guerin 11/11/20 @1327  BY SEEL,MOLLY (NOTE) SARS-CoV-2 target nucleic acids are DETECTED.  The SARS-CoV-2 RNA is generally detectable in upper respiratory specimens during the acute phase of infection. Positive results are indicative of the presence of the identified virus, but do not rule out bacterial infection or co-infection with other pathogens not detected by the test. Clinical correlation with patient history and other diagnostic information is necessary to determine patient infection status. The expected result is Negative.  Fact Sheet for Patients: EntrepreneurPulse.com.au  Fact Sheet for Healthcare  Providers: IncredibleEmployment.be  This test is not yet approved or cleared by the [place] FDA and  has been authorized for detection and/or diagnosis of SARS-CoV-2 by FDA under an Emergency Use Authorization (EUA).  This EUA will remain in effect (meaning this te st can be used) for the duration of  the COVID-19 declaration under Section 564(b)(1) of the Act, 21 U.S.C. section 360bbb-3(b)(1), unless the authorization is terminated or revoked sooner.     Influenza A by PCR NEGATIVE NEGATIVE Final   Influenza B by PCR NEGATIVE NEGATIVE Final    Comment: (NOTE) The Xpert Xpress SARS-CoV-2/FLU/RSV plus assay is intended as an aid in the diagnosis of influenza from Nasopharyngeal swab specimens and should not be used as a sole basis for treatment. Nasal washings and aspirates are unacceptable for Xpert Xpress SARS-CoV-2/FLU/RSV testing.  Fact Sheet for Patients: EntrepreneurPulse.com.au  Fact Sheet for Healthcare Providers: IncredibleEmployment.be  This test is not yet approved or cleared by the [place] FDA and has been authorized for detection and/or diagnosis of SARS-CoV-2 by FDA under an Emergency Use Authorization (EUA). This EUA will remain in effect (meaning this test can be used) for the duration of the COVID-19 declaration under Section 564(b)(1) of the Act,  21 U.S.C. section 360bbb-3(b)(1), unless the authorization is terminated or revoked.  Performed at [facility], [place] [address] [place], [place] [address]   MRSA PCR Screening     Status: None   Collection Time: 11/11/20 10:00 PM   Specimen: Nasal Mucosa; Nasopharyngeal  Result Value Ref Range Status   MRSA by PCR NEGATIVE NEGATIVE Final    Comment:        The GeneXpert MRSA Assay (FDA approved for NASAL specimens only), is one component of a comprehensive MRSA colonization surveillance program. It is not intended to diagnose  MRSA infection nor to guide or monitor treatment for MRSA infections. Performed at [facility], [place] [address], [place], [place] [address]     Labs: Results for orders placed or performed during the hospital encounter of 11/11/20 (from the past 48 hour(s))  CBC with Differential/Platelet     Status: Abnormal   Collection Time: 11/13/20  3:16 AM  Result Value Ref Range   WBC 9.3 4.0 - 10.5 K/uL   RBC 3.70 (L) 3.87 - 5.11 MIL/uL   Hemoglobin 11.8 (L) 12.0 - 15.0 g/dL    Comment: REPEATED TO VERIFY   HCT 36.8 36.0 - 46.0 %   MCV 99.5 80.0 - 100.0 fL   MCH 31.9 26.0 - 34.0 pg   MCHC 32.1 30.0 - 36.0 g/dL   RDW 12.6 11.5 - 15.5 %   Platelets 175 150 - 400 K/uL   nRBC 0.0 0.0 - 0.2 %   Neutrophils Relative % 84 %   Neutro Abs 7.9 (H) 1.7 - 7.7 K/uL   Lymphocytes Relative 11 %   Lymphs Abs 1.0 0.7 - 4.0 K/uL   Monocytes Relative 4 %   Monocytes Absolute 0.4 0.1 - 1.0 K/uL   Eosinophils Relative 0 %   Eosinophils Absolute 0.0 0.0 - 0.5 K/uL   Basophils Relative 0 %   Basophils Absolute 0.0 0.0 - 0.1 K/uL   Immature Granulocytes 1 %   Abs Immature Granulocytes 0.05 0.00 - 0.07 K/uL    Comment: Performed at [facility], [place] [address] [place], [place] [address]  Comprehensive metabolic panel     Status: Abnormal   Collection Time: 11/13/20  3:16 AM  Result Value Ref Range   Sodium 141 135 - 145 mmol/L   Potassium 3.2 (L) 3.5 - 5.1 mmol/L   Chloride 109 98 - 111 mmol/L   CO2 23 22 - 32 mmol/L   Glucose, Bld 108 (H) 70 - 99 mg/dL    Comment: Glucose reference range applies only to samples taken after fasting for at least 8 hours.   BUN 10 6 - 20 mg/dL   Creatinine, Ser 0.48 0.44 - 1.00 mg/dL   Calcium 8.0 (L) 8.9 - 10.3 mg/dL   Total Protein 5.1 (L) 6.5 - 8.1 g/dL   Albumin 2.7 (L) 3.5 - 5.0 g/dL   AST 27 15 - 41 U/L   ALT 47 (H) 0 - 44 U/L   Alkaline Phosphatase 50 38 - 126 U/L   Total Bilirubin 0.3 0.3 - 1.2 mg/dL   GFR, Estimated  >60 >60 mL/min    Comment: (NOTE) Calculated using the CKD-EPI Creatinine Equation (2021)    Anion gap 9 5 - 15    Comment: Performed at [facility], [place] [address], [place],  [address]  C-reactive protein     Status: Abnormal   Collection Time: 11/13/20  3:16 AM  Result Value  Ref Range   CRP 16.8 (H) <1.0 mg/dL    Comment: Performed at [facility], [place] [address], [place], [place] [address]  Ferritin     Status: None   Collection Time: 11/13/20  3:16 AM  Result Value Ref Range   Ferritin 173 11 - 307 ng/mL    Comment: Performed at [facility], [place] [address] [place], [place] [address]  Magnesium     Status: None   Collection Time: 11/13/20  3:16 AM  Result Value Ref Range   Magnesium 1.9 1.7 - 2.4 mg/dL    Comment: Performed at [facility], [place] [address], [place], [place] [address]  Phosphorus     Status: Abnormal   Collection Time: 11/13/20  3:16 AM  Result Value Ref Range   Phosphorus 1.9 (L) 2.5 - 4.6 mg/dL    Comment: Performed at [facility], [place] [address], [place], [place] [address]  Vitamin B12     Status: None   Collection Time: 11/13/20  3:16 AM  Result Value Ref Range   Vitamin B-12 659 180 - 914 pg/mL    Comment: (NOTE) This assay is not validated for testing neonatal or myeloproliferative syndrome specimens for Vitamin B12 levels. Performed at [facility], [place] [address] [place], [place] [address]   Folate     Status: None   Collection Time: 11/13/20  3:16 AM  Result Value Ref Range   Folate 19.8 >5.9 ng/mL    Comment: Performed at [facility], [place] [address] [place], [place] [address]  Iron and TIBC     Status: Abnormal   Collection Time: 11/13/20  3:16 AM  Result Value Ref Range   Iron 24 (L) 28 - 170 ug/dL   TIBC 208 (L) 250 - 450 ug/dL   Saturation Ratios 12 10.4 - 31.8 %   UIBC 184 ug/dL    Comment:  Performed at [facility], [place] [address], [place], [place] [address]  Reticulocytes     Status: Abnormal   Collection Time: 11/13/20  3:16 AM  Result Value Ref Range   Retic Ct Pct 0.8 0.4 - 3.1 %   RBC. 3.73 (L) 3.87 - 5.11 MIL/uL   Retic Count, Absolute 29.5 19.0 - 186.0 K/uL   Immature Retic Fract 8.3 2.3 - 15.9 %    Comment: Performed at [facility], [place] [address] [place], [place] [address]  Amylase     Status: None   Collection Time: 11/13/20  3:16 AM  Result Value Ref Range   Amylase 35 28 - 100 U/L    Comment: Performed at [facility], [place] [address] [place], [place] [address]  Amylase, pleural or peritoneal fluid     Status: None   Collection Time: 11/13/20  2:00 PM  Result Value Ref Range   Amylase, Fluid 214 U/L    Comment: HEMOLYSIS AT THIS LEVEL MAY AFFECT RESULT NO NORMAL RANGE ESTABLISHED FOR THIS TEST Performed at [facility], [address] [place], [place] [address]    Fluid Type-FAMY PERITONEAL CAVITY     Comment: Performed at [facility], [place] [address] [place],  [address]    Imaging / Studies: No results found.  Medications / Allergies: per chart  Antibiotics: Anti-infectives (From admission, onward)   Start     Dose/Rate Route Frequency Ordered Stop   11/12/20 1000  remdesivir 100 mg in sodium  chloride 0.9 % 100 mL IVPB       "Followed by" Linked Group Details   100 mg 200 mL/hr over 30 Minutes Intravenous Daily 11/11/20 1837 11/16/20 0959   11/11/20 2200  piperacillin-tazobactam (ZOSYN) IVPB 3.375 g        3.375 g 12.5 mL/hr over 240 Minutes Intravenous Every 8 hours 11/11/20 2044 11/16/20 2159   11/11/20 2000  remdesivir 200 mg in sodium chloride 0.9% 250 mL IVPB       "Followed by" Linked Group Details   200 mg 580 mL/hr over 30 Minutes Intravenous Once 11/11/20 1837 11/11/20 2137   11/11/20 1330  piperacillin-tazobactam (ZOSYN) IVPB 3.375 g         3.375 g 12.5 mL/hr over 240 Minutes Intravenous  Once 11/11/20 1327 11/11/20 1733        Note: Portions of this report may have been transcribed using voice recognition software. Every effort was made to ensure accuracy; however, inadvertent computerized transcription errors may be present.   Any transcriptional errors that result from this process are unintentional.    Adin Hector, MD, FACS, MASCRS Gastrointestinal and Minimally Invasive Surgery  [facility] [address] [place], [place] [address] [phone] Fax [phone] Main/Paging  CONTACT INFORMATION: Weekday (9AM-5PM) concerns: Call [facility] main office at [phone] Weeknight (5PM-9AM) or Weekend/Holiday concerns: Check [email or web address] for General Surgery [facility] coverage (Please, do not use SecureChat as it is not reliable communication to operating surgeons for immediate patient care)      11/14/2020  7:45 AM

## 2020-11-15 DIAGNOSIS — F410 Panic disorder [episodic paroxysmal anxiety] without agoraphobia: Secondary | ICD-10-CM

## 2020-11-15 LAB — COMPREHENSIVE METABOLIC PANEL
ALT: 25 U/L (ref 0–44)
AST: 17 U/L (ref 15–41)
Albumin: 2.8 g/dL — ABNORMAL LOW (ref 3.5–5.0)
Alkaline Phosphatase: 66 U/L (ref 38–126)
Anion gap: 13 (ref 5–15)
BUN: 7 mg/dL (ref 6–20)
CO2: 21 mmol/L — ABNORMAL LOW (ref 22–32)
Calcium: 5.1 mg/dL — CL (ref 8.9–10.3)
Chloride: 106 mmol/L (ref 98–111)
Creatinine, Ser: 0.47 mg/dL (ref 0.44–1.00)
GFR, Estimated: >60 mL/min (ref >60)
Glucose, Bld: 94 mg/dL (ref 70–99)
Potassium: 5.4 mmol/L — ABNORMAL HIGH (ref 3.5–5.1)
Sodium: 140 mmol/L (ref 135–145)
Total Bilirubin: 0.6 mg/dL (ref 0.3–1.2)
Total Protein: 5.4 g/dL — ABNORMAL LOW (ref 6.5–8.1)

## 2020-11-15 LAB — CBC WITH DIFFERENTIAL/PLATELET
Abs Immature Granulocytes: 0.05 10*3/uL (ref 0.00–0.07)
Basophils Absolute: 0 10*3/uL (ref 0.0–0.1)
Basophils Relative: 0 %
Eosinophils Absolute: 0.1 10*3/uL (ref 0.0–0.5)
Eosinophils Relative: 1 %
HCT: 40.2 % (ref 36.0–46.0)
Hemoglobin: 12.9 g/dL (ref 12.0–15.0)
Immature Granulocytes: 1 %
Lymphocytes Relative: 14 %
Lymphs Abs: 1 10*3/uL (ref 0.7–4.0)
MCH: 32.5 pg (ref 26.0–34.0)
MCHC: 32.1 g/dL (ref 30.0–36.0)
MCV: 101.3 fL — ABNORMAL HIGH (ref 80.0–100.0)
Monocytes Absolute: 0.4 10*3/uL (ref 0.1–1.0)
Monocytes Relative: 5 %
Neutro Abs: 5.4 10*3/uL (ref 1.7–7.7)
Neutrophils Relative %: 79 %
Platelets: 185 10*3/uL (ref 150–400)
RBC: 3.97 MIL/uL (ref 3.87–5.11)
RDW: 11.9 % (ref 11.5–15.5)
WBC: 6.8 10*3/uL (ref 4.0–10.5)
nRBC: 0 % (ref 0.0–0.2)

## 2020-11-15 LAB — PHOSPHORUS: Phosphorus: 2.5 mg/dL (ref 2.5–4.6)

## 2020-11-15 LAB — C-REACTIVE PROTEIN: CRP: 16.4 mg/dL — ABNORMAL HIGH (ref <1.0)

## 2020-11-15 LAB — FERRITIN: Ferritin: 196 ng/mL (ref 11–307)

## 2020-11-15 LAB — MAGNESIUM: Magnesium: 1.3 mg/dL — ABNORMAL LOW (ref 1.7–2.4)

## 2020-11-15 LAB — GLUCOSE, CAPILLARY: Glucose-Capillary: 89 mg/dL (ref 70–99)

## 2020-11-15 MED ORDER — MAGNESIUM SULFATE 4 GM/100ML IV SOLN
4.0000 g | Freq: Once | INTRAVENOUS | Status: AC
  Administered 2020-11-15: 4 g via INTRAVENOUS
  Filled 2020-11-15: qty 100

## 2020-11-15 MED ORDER — LORAZEPAM 2 MG/ML IJ SOLN
1.0000 mg | Freq: Once | INTRAMUSCULAR | Status: AC
  Administered 2020-11-15: 1 mg via INTRAVENOUS
  Filled 2020-11-15: qty 1

## 2020-11-15 MED ORDER — LORAZEPAM 2 MG/ML IJ SOLN
1.0000 mg | INTRAMUSCULAR | Status: DC | PRN
  Administered 2020-11-15 (×2): 2 mg via INTRAVENOUS
  Filled 2020-11-15 (×2): qty 1

## 2020-11-15 MED ORDER — SODIUM CHLORIDE 0.9 % IV SOLN
4.0000 g | Freq: Once | INTRAVENOUS | Status: AC
  Administered 2020-11-15: 4 g via INTRAVENOUS
  Filled 2020-11-15: qty 40

## 2020-11-15 MED ORDER — HALOPERIDOL LACTATE 5 MG/ML IJ SOLN
2.0000 mg | Freq: Four times a day (QID) | INTRAMUSCULAR | Status: DC | PRN
  Administered 2020-11-15: 2 mg via INTRAVENOUS
  Administered 2020-11-16: 5 mg via INTRAVENOUS
  Filled 2020-11-15 (×2): qty 1

## 2020-11-15 NOTE — Progress Notes (Signed)
Pt still delirious. Keeps taking everything off. Pt tried to get out of bed. Pt was placed back into bed. Will continue to monitor.

## 2020-11-15 NOTE — Progress Notes (Signed)
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 11/11/2020 Active Problems:   Pneumonia due to COVID-19 virus   Panic attacks   HTN (hypertension)   Anxiety and depression   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   Intra-abdominal free air of unknown etiology   Bipolar 2 disorder ([place])   Hypokalemia   Hypophosphatemia   Acute respiratory failure with hypoxia ([place])   4 Days Post-Op  11/11/2020  POST-OPERATIVE DIAGNOSIS:   PERFORATED GASTROJEJUNAL ANASTOMOTIC ULCER HISTORY OF ROUX-N-Y GASTRIC BYPASS 2014  PROCEDURE:   LAPAROSCOPIC OMENTAL GRAHAM PATCH OF ULCER LAPAROSCOPIC LYSIS OF ADHESIONS  SURGEON:  Adin Hector, MD    Assessment  Recovering  [facility] Stay = 4 days)  Plan:  COVID - 19 infection - TRH to consult - Tx  HTN Hypothyroidism  Depression  Anxiety -more severe with panic attack.  Increase Ativan dose for now.  See if medicine has further insights as well hopefully can get her on her usual medications if passes swallow study tomorrow.  We will see. ABL anemia - hgb 11-12  Hx of Roux-en Y gastric bypass 2014 Hx of perforated marginal ulcer at [place] with diagnostic laparoscopy and drainage of collection 2016 Dr. Hassell Done Perforation at [facility] anastomosis S/p laparoscopic graham patch with drain placement 11/13/20 Dr. Johney Maine -  POD#4 - H. Pylori - negative  - needs to mobilize - drain with SS fluid -drain amylase 1/7 10 times higher. Suspicious for small residual leak. Low output and clinically improving. Keep for now and recheck 1/10 Monday. - NGT malpositioned and removed. Patient cannot tolerate replacement. Try and hold off for now since clinically improving - continue PPI -now IV every 12 hours per protocol  - Esophagram on Monday -Consider weaning off PCA tomorrow if continues to improve -Okay to transfer to floor from surgery standpoint. Will defer to medicine given her Covid positivity & hypoxia  FEN: NPO, IVF. Hyomagnesemia & hypophosphatemia Replace. VTE: SQ heparin  ID: Zosyn 1/5>>  Disposition:  The patient is from: Home  Anticipate discharge to:  Home  Anticipated Date of Discharge is:  November 21, 2020  Barriers to discharge:  Pending Clinical improvement (more likely than not  Patient currently is NOT MEDICALLY STABLE for discharge from the hospital from a surgery standpoint.   25 minutes spent in review, evaluation, examination, counseling, and coordination of care.  More than 50% of that time was spent in counseling.  I updated the patient's status to the patient and nurse  Recommendations were made.  Patient extremely anxious but seemed somewhat consolable.  Questions were answered.  They expressed understanding & appreciation.   11/15/2020    Subjective: (Chief complaint)  Patient increasingly anxious.  Needing Ativan.  Less abdominal pain overall but likes to have PCA.  Worried about loose bowels and many other things.  Nursing just outside room.  Remains on 5 L oxygen.  Objective:  Vital signs:  Vitals:   11/15/20 0600 11/15/20 0800 11/15/20 0801 11/15/20 0900  BP: 133/61 (!) 160/88    Pulse: 76 72    Resp: 19 12 20    Temp:    97.7 F (36.5 C)  TempSrc:    Axillary  SpO2: 94% 93% 93%   Weight:      Height:        Last BM Date:  (PTA)  Intake/Output    Yesterday:  01/08 0701 - 01/09 0700 In: 2796.9 [I.V.:921.7; IV Piggyback:1875.3] Out: 355 [Urine:350; Drains:5] This shift:  Total I/O In: 154.5 [I.V.:115.6; IV Piggyback:38.9] Out: -   Bowel function:  Flatus: YES  BM:  No  Drain: Serosanguinous   Physical Exam:  General: Pt awake/alert in no acute distress. Not toxic at this time Eyes: PERRL, normal EOM.  Sclera clear.  No icterus Neuro: CN II-XII intact w/o focal sensory/motor deficits. Lymph: No head/neck/groin lymphadenopathy Psych:  No delerium/psychosis/paranoia.  Oriented x 4. Very anxious but consolable. HENT: Normocephalic, Mucus membranes moist.  No thrush Neck: Supple, No tracheal deviation.  No obvious thyromegaly Chest: No pain to chest wall compression.  Good respiratory excursion.  No audible wheezing CV:  Pulses intact.  Regular rhythm.  No major extremity edema MS: Normal AROM mjr joints.  No obvious deformity  Abdomen: Soft.  Mildy distended.  Mildly tender at incisions only.  Mild peritonitis - improved.  No incarcerated hernias.  Ext:  No deformity.  No mjr edema.  No cyanosis Skin: No petechiae / purpurea.  No major sores.  Warm and dry    Results:   Cultures: Recent Results (from the past 720 hour(s))  Resp Panel by RT-PCR (Flu A&B, Covid) Nasopharyngeal Swab     Status: Abnormal   Collection Time: 11/11/20 11:24 AM   Specimen: Nasopharyngeal Swab; Nasopharyngeal(NP) swabs in vial transport medium  Result Value Ref Range Status   SARS Coronavirus 2 by RT PCR POSITIVE (A) NEGATIVE Final    Comment: RESULT CALLED TO, READ BACK BY AND VERIFIED WITH: Elayne Guerin 11/11/20 @1327  BY SEEL,MOLLY (NOTE) SARS-CoV-2 target nucleic acids are DETECTED.  The SARS-CoV-2 RNA is generally detectable in upper respiratory specimens during the acute phase of infection. Positive results are indicative of the presence of the identified virus, but do not rule out bacterial infection or co-infection with other  pathogens not detected by the test. Clinical correlation with patient history and other diagnostic information is necessary to determine patient infection status. The expected result is Negative.  Fact Sheet for Patients: EntrepreneurPulse.com.au  Fact Sheet for Healthcare Providers: IncredibleEmployment.be  This test is not yet approved or cleared by the [place] FDA and  has been authorized for detection and/or diagnosis of SARS-CoV-2 by FDA under an Emergency Use Authorization (EUA).  This EUA will remain in effect (meaning this te st can be used) for the duration of  the COVID-19 declaration under Section 564(b)(1) of the Act, 21 U.S.C. section 360bbb-3(b)(1), unless the authorization is terminated or revoked sooner.     Influenza A by PCR NEGATIVE NEGATIVE Final   Influenza B by PCR NEGATIVE NEGATIVE Final    Comment: (NOTE) The Xpert Xpress SARS-CoV-2/FLU/RSV plus assay is intended as an aid in the diagnosis of influenza from Nasopharyngeal swab specimens and should not be used as a sole basis for treatment. Nasal washings and aspirates are unacceptable for Xpert Xpress SARS-CoV-2/FLU/RSV testing.  Fact Sheet for Patients: EntrepreneurPulse.com.au  Fact Sheet for Healthcare Providers: IncredibleEmployment.be  This test is not yet approved  or cleared by the [place] and has been authorized for detection and/or diagnosis of SARS-CoV-2 by FDA under an Emergency Use Authorization (EUA). This EUA will remain in effect (meaning this test can be used) for the duration of the COVID-19 declaration under Section 564(b)(1) of the Act, 21 U.S.C. section 360bbb-3(b)(1), unless the authorization is terminated or revoked.  Performed at [facility], [place] [address] [place], [place] [address]   MRSA PCR Screening     Status: None   Collection Time: 11/11/20 10:00 PM   Specimen:  Nasal Mucosa; Nasopharyngeal  Result Value Ref Range Status   MRSA by PCR NEGATIVE NEGATIVE Final    Comment:        The GeneXpert MRSA Assay (FDA approved for NASAL specimens only), is one component of a comprehensive MRSA colonization surveillance program. It is not intended to diagnose MRSA infection nor to guide or monitor treatment for MRSA infections. Performed at [facility], [place] [address], [place], [place] [address]     Labs: Results for orders placed or performed during the hospital encounter of 11/11/20 (from the past 48 hour(s))  Amylase, pleural or peritoneal fluid     Status: None   Collection Time: 11/13/20  2:00 PM  Result Value Ref Range   Amylase, Fluid 214 U/L    Comment: HEMOLYSIS AT THIS LEVEL MAY AFFECT RESULT NO NORMAL RANGE ESTABLISHED FOR THIS TEST Performed at [facility], [address] [place], [place] [address]    Fluid Type-FAMY PERITONEAL CAVITY     Comment: Performed at [facility], [place] [address], [place], [place] [address]  CBC with Differential/Platelet     Status: Abnormal   Collection Time: 11/14/20  7:56 AM  Result Value Ref Range   WBC 7.8 4.0 - 10.5 K/uL   RBC 3.50 (L) 3.87 - 5.11 MIL/uL   Hemoglobin 11.2 (L) 12.0 - 15.0 g/dL   HCT 34.2 (L) 36.0 - 46.0 %   MCV 97.7 80.0 - 100.0 fL   MCH 32.0 26.0 - 34.0 pg   MCHC 32.7 30.0 - 36.0 g/dL   RDW 12.4 11.5 - 15.5 %   Platelets 203 150 - 400 K/uL   nRBC 0.0 0.0 - 0.2 %   Neutrophils Relative % 81 %   Neutro Abs 6.3 1.7 - 7.7 K/uL   Lymphocytes Relative 14 %   Lymphs Abs 1.1 0.7 - 4.0 K/uL   Monocytes Relative 4 %   Monocytes Absolute 0.3 0.1 - 1.0 K/uL   Eosinophils Relative 1 %   Eosinophils Absolute 0.1 0.0 - 0.5 K/uL   Basophils Relative 0 %   Basophils Absolute 0.0 0.0 - 0.1 K/uL   Immature Granulocytes 0 %   Abs Immature Granulocytes 0.03 0.00 - 0.07 K/uL    Comment: Performed at [facility], [place]  [address], [place],  [address]  Comprehensive metabolic panel     Status: Abnormal   Collection Time: 11/14/20  7:56 AM  Result Value Ref Range   Sodium 141 135 - 145 mmol/L   Potassium 3.3 (L) 3.5 - 5.1 mmol/L   Chloride 106 98 - 111 mmol/L   CO2 25 22 - 32 mmol/L   Glucose, Bld 94 70 - 99 mg/dL    Comment: Glucose reference range applies only to samples taken after fasting for at least 8 hours.   BUN 9 6 - 20 mg/dL   Creatinine, Ser 0.50 0.44 - 1.00 mg/dL  Calcium 8.4 (L) 8.9 - 10.3 mg/dL   Total Protein 5.5 (L) 6.5 - 8.1 g/dL   Albumin 2.8 (L) 3.5 - 5.0 g/dL   AST 17 15 - 41 U/L   ALT 32 0 - 44 U/L   Alkaline Phosphatase 59 38 - 126 U/L   Total Bilirubin 0.6 0.3 - 1.2 mg/dL   GFR, Estimated >60 >60 mL/min    Comment: (NOTE) Calculated using the CKD-EPI Creatinine Equation (2021)    Anion gap 10 5 - 15    Comment: Performed at [facility], [place] [address] [place], [place] [address]  C-reactive protein     Status: Abnormal   Collection Time: 11/14/20  7:56 AM  Result Value Ref Range   CRP 17.3 (H) <1.0 mg/dL    Comment: Performed at [facility], [place] [address] [place], [place] [address]  Ferritin     Status: None   Collection Time: 11/14/20  7:56 AM  Result Value Ref Range   Ferritin 175 11 - 307 ng/mL    Comment: Performed at [facility], [place] [address] [place], [place] [address]  Magnesium     Status: None   Collection Time: 11/14/20  7:56 AM  Result Value Ref Range   Magnesium 1.9 1.7 - 2.4 mg/dL    Comment: Performed at [facility] [place] [facility], [place] [address] [place], [place] [address]  Phosphorus     Status: Abnormal   Collection Time: 11/14/20  7:56 AM  Result Value Ref Range   Phosphorus 2.2 (L) 2.5 - 4.6 mg/dL    Comment: Performed at [facility], [place] [address] [place], [place] [address]  CBC with Differential/Platelet     Status: Abnormal   Collection Time:  11/15/20  2:36 AM  Result Value Ref Range   WBC 6.8 4.0 - 10.5 K/uL   RBC 3.97 3.87 - 5.11 MIL/uL   Hemoglobin 12.9 12.0 - 15.0 g/dL   HCT 40.2 36.0 - 46.0 %   MCV 101.3 (H) 80.0 - 100.0 fL   MCH 32.5 26.0 - 34.0 pg   MCHC 32.1 30.0 - 36.0 g/dL   RDW 11.9 11.5 - 15.5 %   Platelets 185 150 - 400 K/uL   nRBC 0.0 0.0 - 0.2 %   Neutrophils Relative % 79 %   Neutro Abs 5.4 1.7 - 7.7 K/uL   Lymphocytes Relative 14 %   Lymphs Abs 1.0 0.7 - 4.0 K/uL   Monocytes Relative 5 %   Monocytes Absolute 0.4 0.1 - 1.0 K/uL   Eosinophils Relative 1 %   Eosinophils Absolute 0.1 0.0 - 0.5 K/uL   Basophils Relative 0 %   Basophils Absolute 0.0 0.0 - 0.1 K/uL   Immature Granulocytes 1 %   Abs Immature Granulocytes 0.05 0.00 - 0.07 K/uL    Comment: Performed at [facility], [place] [address] [place], [place] [address]  Comprehensive metabolic panel     Status: Abnormal   Collection Time: 11/15/20  2:36 AM  Result Value Ref Range   Sodium 140 135 - 145 mmol/L   Potassium 5.4 (H) 3.5 - 5.1 mmol/L    Comment: DELTA CHECK NOTED   Chloride 106 98 - 111 mmol/L   CO2 21 (L) 22 - 32 mmol/L   Glucose, Bld 94 70 - 99 mg/dL    Comment: Glucose reference range applies only to samples taken after fasting for at least 8 hours.   BUN 7  6 - 20 mg/dL   Creatinine, Ser 0.47 0.44 - 1.00 mg/dL   Calcium 5.1 (LL) 8.9 - 10.3 mg/dL    Comment: CRITICAL RESULT CALLED TO, READ BACK BY AND VERIFIED WITH: JENNA RN 11/15/20 @0317  BY P.HENDERSON    Total Protein 5.4 (L) 6.5 - 8.1 g/dL   Albumin 2.8 (L) 3.5 - 5.0 g/dL   AST 17 15 - 41 U/L   ALT 25 0 - 44 U/L   Alkaline Phosphatase 66 38 - 126 U/L   Total Bilirubin 0.6 0.3 - 1.2 mg/dL   GFR, Estimated >60 >60 mL/min    Comment: (NOTE) Calculated using the CKD-EPI Creatinine Equation (2021)    Anion gap 13 5 - 15    Comment: Performed at [facility], [place] [address] [place], [place] [address]  C-reactive protein     Status:  Abnormal   Collection Time: 11/15/20  2:36 AM  Result Value Ref Range   CRP 16.4 (H) <1.0 mg/dL    Comment: Performed at [facility], [place] [address], [place], [place] [address]  Ferritin     Status: None   Collection Time: 11/15/20  2:36 AM  Result Value Ref Range   Ferritin 196 11 - 307 ng/mL    Comment: Performed at [facility], [place] [address] [place], [place] [address]  Magnesium     Status: Abnormal   Collection Time: 11/15/20  2:36 AM  Result Value Ref Range   Magnesium 1.3 (L) 1.7 - 2.4 mg/dL    Comment: Performed at [facility], [place] [address], [place], [place] [address]  Phosphorus     Status: None   Collection Time: 11/15/20  2:36 AM  Result Value Ref Range   Phosphorus 2.5 2.5 - 4.6 mg/dL    Comment: Performed at [facility], [place] [address] [place], [place] [address]    Imaging / Studies: No results found.  Medications / Allergies: per chart  Antibiotics: Anti-infectives (From admission, onward)   Start     Dose/Rate Route Frequency Ordered Stop   11/12/20 1000  remdesivir 100 mg in sodium chloride 0.9 % 100 mL IVPB       "Followed by" Linked Group Details   100 mg 200 mL/hr over 30 Minutes Intravenous Daily 11/11/20 1837 11/16/20 0959   11/11/20 2200  piperacillin-tazobactam (ZOSYN) IVPB 3.375 g        3.375 g 12.5 mL/hr over 240 Minutes Intravenous Every 8 hours 11/11/20 2044 11/16/20 2159   11/11/20 2000  remdesivir 200 mg in sodium chloride 0.9% 250 mL IVPB       "Followed by" Linked Group Details   200 mg 580 mL/hr over 30 Minutes Intravenous Once 11/11/20 1837 11/11/20 2137   11/11/20 1330  piperacillin-tazobactam (ZOSYN) IVPB 3.375 g        3.375 g 12.5 mL/hr over 240 Minutes Intravenous  Once 11/11/20 1327 11/11/20 1733        Note: Portions of this report may have been transcribed using voice recognition software. Every effort was made to ensure accuracy; however,  inadvertent computerized transcription errors may be present.   Any transcriptional errors that result from this process are unintentional.    Adin Hector, MD, FACS, MASCRS Gastrointestinal and Minimally Invasive Surgery  [facility] [address], [place], [place] [address] [phone] Fax [phone] Main/Paging  CONTACT INFORMATION: Weekday (9AM-5PM) concerns: Call [facility] main office at [phone] Weeknight (  5PM-9AM) or Weekend/Holiday concerns: Check [email or web address] for General Surgery [facility] coverage (Please, do not use SecureChat as it is not reliable communication to operating surgeons for immediate patient care)      11/15/2020  9:30 AM

## 2020-11-15 NOTE — Progress Notes (Signed)
PROGRESS NOTE Consult   Kimberly Stanley  NWG:[id number] DOB: [birth year] DOA: 11/11/2020 PCP: Harlan Stains, MD  Brief Narrative:  -Patient presented with peritonitis, found to be COVID positive. Admitted to the Surgical team. Underwent Surgical intervention for perforated bowel on 11/11/20.  -Had dyspnea on presentation that has worsened post-operatively but patient reports not being able to take a deep breath due to abdominal pain post-operatively.  -Patient on PCA pump for pain management.  Patient with long-standing history of anxiety and on xanax 1mg  po x4 daily at home. Started on Ativan 0.5mg  IV q4h prn for anxiety on 11/12/20. Remains NPO with NG tube in place.  11/14/2020: Patient seen alongside patient's nurse.  Patient is currently on 8 L of supplemental oxygen with O2 sat of 94 to 95%.  We will continue to wean down supplemental oxygen to keep O2 sat equal to or greater than 91%.  Patient is currently on IV remdesivir, started on 11/12/2020.  Patient has no new complaints today.  11/15/2020: Patient continues to improve.  Supplemental oxygen is to 6 L/min via nasal cannula, and O2 sat is 93%.  We will titrate supplemental oxygen to keep O2 sat equal or greater than 91%.  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 NG tube in place. Continue pain management per primary team.  11/13/20 Patient to continue NG tube and to remain NPO for meds. Her pain is reported as better  controlled. Patient encouraged to sit up, ambulate.   COVID 19 infection Acute hypoxic respiratory failure      - only symptom on presentation was cough and she didn't find it significant enough to attempt treatment at home     - she has atelectasis on the lung fields we can see from current imaging; will ordered dedicated CXR     - she was on 2L [place] on presentation with shallow breathing from abdominal pain as she will not take deep breathes on examination due to the pain.  11/12/20 Continue remdesivir, IS, and anti-tussives, follow inflammatory markers.  Mobilize as able and as recommended per primary team.  11/13/20 Patient encouraged to use IS, to sit up, and to take deep breaths using the pillow technique. 11/14/2020: Continue to wean down supplemental oxygen.  Complete course of remdesivir. 11/15/2020: Patient continues to improve.  Continue to titrate supplemental oxygen downwards.  Post-op normocytic anemia Hgb of 14.5 on presentation but likely due to hemoconcentration with unknown baseline Hgb.  Hgb today of 7.7.  Will check anemia panel, would transfuse if Hgb less than 7.  11/13/20 hgb improved overnight, question possible labs error yesterday. Iron low but ferritin borderline normal. Vitamin B12 and folate normal. Will continue to monitor.    Hypokalemia Potassium is 3.3 today.  Magnesium is 1.9. We will continue to replete.  11/15/2020: Resolved.  Continue to monitor closely.  Hypomagnesemia: -Magnesium is 1.3 today. -Patient has received IV magnesium sulfate 4 g as well. -  Continue monitor magnesium level.  Low phosphorus Phosphorus is 2.2. 11/15/2020: Resolved.  Phosphorus is 2.5 today.  DVT prophylaxis: Lovenox Code Status: Full Family Communication: None Disposition Plan: Pending post-op improvement. High risk of worsening respiratory status due to abdominal pain and shallow breathing with COVID+ status.     Procedures:  Laparoscopy and drainage of bowel perforation with  omental patch and lysis of adhesions on 11/11/20 by Dr. Michael Boston.   Antimicrobials:  Zosyn  Subjective: No new complaints. Patient reports having improved significantly. Continue to wean down supplemental oxygen.  Objective: Vitals:   11/15/20 0600 11/15/20 0800 11/15/20 0801 11/15/20 0900  BP: 133/61 (!) 160/88    Pulse: 76 72    Resp: 19 12 20    Temp:    97.7 F (36.5 C)  TempSrc:    Axillary  SpO2: 94% 93% 93%   Weight:      Height:        Intake/Output Summary (Last 24 hours) at 11/15/2020 1119 Last data filed at 11/15/2020 0835 Gross per 24 hour  Intake 2622.2 ml  Output 355 ml  Net 2267.2 ml   Filed Weights   11/11/20 2100  Weight: 92.2 kg    Examination:  General exam: no distress, resting in bed.  Respiratory system: Decreased air entry. Cardiovascular system: S1 & S2  Gastrointestinal system: Abdomen is obese, nontender, with drain.  Organs are difficult to assess. Central nervous system: Alert and oriented. No focal neurological deficits. Extremities: No leg edema  Data Reviewed: I have personally reviewed following labs  CBC: Recent Labs  Lab 11/11/20 1119 11/12/20 0333 11/13/20 0316 11/14/20 0756 11/15/20 0236  WBC 6.1 6.2 9.3 7.8 6.8  NEUTROABS 4.6 5.6 7.9* 6.3 5.4  HGB 14.5 7.7* 11.8* 11.2* 12.9  HCT 42.1 23.1* 36.8 34.2* 40.2  MCV 94.6 98.7 99.5 97.7 101.3*  PLT 218 114* 175 203 [id number]   Basic Metabolic Panel: Recent Labs  Lab 11/11/20 1119 11/11/20 2110 11/13/20 0316 11/14/20 0756 11/15/20 0236  NA 137  --  141 141 140  K 3.4*  --  3.2* 3.3* 5.4*  CL 104  --  109 106 106  CO2 24  --  23 25 21*  GLUCOSE 102*  --  108* 94 94  BUN 11  --  10 9 7   CREATININE 0.73  --  0.48 0.50 0.47  CALCIUM 8.6*  --  8.0* 8.4* 5.1*  MG  --  1.9 1.9 1.9 1.3*  PHOS  --   --  1.9* 2.2* 2.5   GFR: Estimated Creatinine Clearance: 92.3 mL/min (by C-G formula based on SCr of 0.47 mg/dL). Liver Function Tests: Recent Labs  Lab 11/11/20 1119  11/13/20 0316 11/14/20 0756 11/15/20 0236  AST 26 27 17 17   ALT 28 47* 32 25  ALKPHOS 65 50 59 66  BILITOT 0.5 0.3 0.6 0.6  PROT 6.5 5.1* 5.5* 5.4*  ALBUMIN 3.8 2.7* 2.8* 2.8*   Recent Labs  Lab 11/11/20 1119 11/13/20 0316  LIPASE 24  --   AMYLASE  --  35   No results for input(s): AMMONIA in the last 168 hours. Coagulation Profile: No results for input(s): INR, PROTIME in the last 168 hours. Cardiac Enzymes: No results for input(s): CKTOTAL, CKMB, CKMBINDEX, TROPONINI in the last 168 hours. BNP (last 3 results) No results for input(s): PROBNP in the last 8760 hours. HbA1C: No results for input(s): HGBA1C in the last 72 hours. CBG: No results for input(s): GLUCAP in the last  168 hours. Lipid Profile: No results for input(s): CHOL, HDL, LDLCALC, TRIG, CHOLHDL, LDLDIRECT in the last 72 hours. Thyroid Function Tests: No results for input(s): TSH, T4TOTAL, FREET4, T3FREE, THYROIDAB in the last 72 hours. Anemia Panel: Recent Labs    11/13/20 0316 11/14/20 0756 11/15/20 0236  VITAMINB12 659  --   --   FOLATE 19.8  --   --   FERRITIN 173 175 196  TIBC 208*  --   --   IRON 24*  --   --   RETICCTPCT 0.8  --   --    Sepsis Labs: Recent Labs  Lab 11/11/20 1120  LATICACIDVEN 1.1    Recent Results (from the past 240 hour(s))  Resp Panel by RT-PCR (Flu A&B, Covid) Nasopharyngeal Swab     Status: Abnormal   Collection Time: 11/11/20 11:24 AM   Specimen: Nasopharyngeal Swab; Nasopharyngeal(NP) swabs in vial transport medium  Result Value Ref Range Status   SARS Coronavirus 2 by RT PCR POSITIVE (A) NEGATIVE Final    Comment: RESULT CALLED TO, READ BACK BY AND VERIFIED WITH: Elayne Guerin 11/11/20 @1327  BY SEEL,MOLLY (NOTE) SARS-CoV-2 target nucleic acids are DETECTED.  The SARS-CoV-2 RNA is generally detectable in upper respiratory specimens during the acute phase of infection. Positive results are indicative of the presence of the identified virus, but do not  rule out bacterial infection or co-infection with other pathogens not detected by the test. Clinical correlation with patient history and other diagnostic information is necessary to determine patient infection status. The expected result is Negative.  Fact Sheet for Patients: EntrepreneurPulse.com.au  Fact Sheet for Healthcare Providers: IncredibleEmployment.be  This test is not yet approved or cleared by the [place] FDA and  has been authorized for detection and/or diagnosis of SARS-CoV-2 by FDA under an Emergency Use Authorization (EUA).  This EUA will remain in effect (meaning this te st can be used) for the duration of  the COVID-19 declaration under Section 564(b)(1) of the Act, 21 U.S.C. section 360bbb-3(b)(1), unless the authorization is terminated or revoked sooner.     Influenza A by PCR NEGATIVE NEGATIVE Final   Influenza B by PCR NEGATIVE NEGATIVE Final    Comment: (NOTE) The Xpert Xpress SARS-CoV-2/FLU/RSV plus assay is intended as an aid in the diagnosis of influenza from Nasopharyngeal swab specimens and should not be used as a sole basis for treatment. Nasal washings and aspirates are unacceptable for Xpert Xpress SARS-CoV-2/FLU/RSV testing.  Fact Sheet for Patients: EntrepreneurPulse.com.au  Fact Sheet for Healthcare Providers: IncredibleEmployment.be  This test is not yet approved or cleared by the [place] FDA and has been authorized for detection and/or diagnosis of SARS-CoV-2 by FDA under an Emergency Use Authorization (EUA). This EUA will remain in effect (meaning this test can be used) for the duration of the COVID-19 declaration under Section 564(b)(1) of the Act, 21 U.S.C. section 360bbb-3(b)(1), unless the authorization is terminated or revoked.  Performed at [facility], [place] [address], [place], [place] [address]   MRSA PCR Screening      Status: None   Collection Time: 11/11/20 10:00 PM   Specimen: Nasal Mucosa; Nasopharyngeal  Result Value Ref Range Status   MRSA by PCR NEGATIVE NEGATIVE Final    Comment:        The GeneXpert MRSA Assay (FDA approved for NASAL specimens only), is one component of a comprehensive MRSA colonization surveillance program. It is not intended to diagnose MRSA infection nor to guide or monitor treatment for  MRSA infections. Performed at [facility], [place] [address], [place], [place] [address]          Radiology Studies: No results found.      Scheduled Meds: . chlorhexidine  15 mL Mouth Rinse BID  . Chlorhexidine Gluconate Cloth  6 each Topical Daily  . heparin injection (subcutaneous)  5,000 Units Subcutaneous Q8H  . HYDROmorphone   Intravenous Q4H  . lip balm  1 application Topical BID  . mouth rinse  15 mL Mouth Rinse q12n4p  . pantoprazole  40 mg Intravenous Q12H  . sodium chloride flush  3 mL Intravenous Q12H   Continuous Infusions: . sodium chloride Stopped (11/15/20 0627)  . lactated ringers    . lactated ringers 50 mL/hr at 11/15/20 0835  . methocarbamol (ROBAXIN) IV Stopped (11/15/20 0537)  . piperacillin-tazobactam (ZOSYN)  IV 12.5 mL/hr at 11/15/20 0835  . remdesivir 100 mg in NS 100 mL Stopped (11/14/20 1032)     LOS: 4 days    Time spent:  35 minutes   Bonnell Public, MD [facility]   If 7PM-7AM, please contact night-coverage [email or web address] Password [email or web address] 11/15/2020, 11:19 AM

## 2020-11-15 NOTE — Plan of Care (Signed)
  Problem: Education: Goal: Understanding of discharge needs will improve Outcome: Progressing Goal: Verbalization of understanding of the causes of altered bowel function will improve Outcome: Progressing   Problem: Activity: Goal: Ability to tolerate increased activity will improve Outcome: Progressing   Problem: Bowel/Gastric: Goal: Gastrointestinal status for postoperative course will improve Outcome: Progressing   Problem: Health Behavior/Discharge Planning: Goal: Identification of community resources to assist with postoperative recovery needs will improve Outcome: Progressing   Problem: Nutritional: Goal: Will attain and maintain optimal nutritional status will improve Outcome: Progressing   Problem: Clinical Measurements: Goal: Postoperative complications will be avoided or minimized Outcome: Progressing   Problem: Respiratory: Goal: Respiratory status will improve Outcome: Progressing   Problem: Skin Integrity: Goal: Will show signs of wound healing Outcome: Progressing   Problem: Education: Goal: Knowledge of risk factors and measures for prevention of condition will improve Outcome: Progressing   Problem: Coping: Goal: Psychosocial and spiritual needs will be supported Outcome: Progressing   Problem: Respiratory: Goal: Will maintain a patent airway Outcome: Progressing Goal: Complications related to the disease process, condition or treatment will be avoided or minimized Outcome: Progressing

## 2020-11-15 NOTE — Progress Notes (Signed)
Pt was seen getting out of bed and everything was pulled off. Pt was put back into bed and calmed down. Pt is delirious and CBG was 89. Tempt was 79 F ax. Dr. Marthenia Rolling & Dr. Johney Maine notified. Charge nurse helped placed a new IV. This nurse will continue to monitor.

## 2020-11-15 NOTE — Progress Notes (Signed)
CRITICAL VALUE ALERT  Critical Value: ca 5.1  Date & Time Notied:  11/15/20 0320  Provider Notified: [facility]   Orders Received/Actions taken: none at this time

## 2020-11-16 LAB — CBC WITH DIFFERENTIAL/PLATELET
Abs Immature Granulocytes: 0.09 10*3/uL — ABNORMAL HIGH (ref 0.00–0.07)
Basophils Absolute: 0 10*3/uL (ref 0.0–0.1)
Basophils Relative: 1 %
Eosinophils Absolute: 0.1 10*3/uL (ref 0.0–0.5)
Eosinophils Relative: 1 %
HCT: 36.3 % (ref 36.0–46.0)
Hemoglobin: 12.2 g/dL (ref 12.0–15.0)
Immature Granulocytes: 2 %
Lymphocytes Relative: 20 %
Lymphs Abs: 1.2 10*3/uL (ref 0.7–4.0)
MCH: 31.8 pg (ref 26.0–34.0)
MCHC: 33.6 g/dL (ref 30.0–36.0)
MCV: 94.5 fL (ref 80.0–100.0)
Monocytes Absolute: 0.5 10*3/uL (ref 0.1–1.0)
Monocytes Relative: 8 %
Neutro Abs: 4.3 10*3/uL (ref 1.7–7.7)
Neutrophils Relative %: 68 %
Platelets: 270 10*3/uL (ref 150–400)
RBC: 3.84 MIL/uL — ABNORMAL LOW (ref 3.87–5.11)
RDW: 11.9 % (ref 11.5–15.5)
WBC: 6.1 10*3/uL (ref 4.0–10.5)
nRBC: 0 % (ref 0.0–0.2)

## 2020-11-16 LAB — COMPREHENSIVE METABOLIC PANEL
ALT: 23 U/L (ref 0–44)
AST: 18 U/L (ref 15–41)
Albumin: 2.6 g/dL — ABNORMAL LOW (ref 3.5–5.0)
Alkaline Phosphatase: 65 U/L (ref 38–126)
Anion gap: 11 (ref 5–15)
BUN: 6 mg/dL (ref 6–20)
CO2: 22 mmol/L (ref 22–32)
Calcium: 8 mg/dL — ABNORMAL LOW (ref 8.9–10.3)
Chloride: 105 mmol/L (ref 98–111)
Creatinine, Ser: 0.54 mg/dL (ref 0.44–1.00)
GFR, Estimated: >60 mL/min (ref >60)
Glucose, Bld: 104 mg/dL — ABNORMAL HIGH (ref 70–99)
Potassium: 3.4 mmol/L — ABNORMAL LOW (ref 3.5–5.1)
Sodium: 138 mmol/L (ref 135–145)
Total Bilirubin: 1 mg/dL (ref 0.3–1.2)
Total Protein: 5.1 g/dL — ABNORMAL LOW (ref 6.5–8.1)

## 2020-11-16 LAB — AMYLASE: Amylase: 32 U/L (ref 28–100)

## 2020-11-16 LAB — C-REACTIVE PROTEIN: CRP: 9.6 mg/dL — ABNORMAL HIGH (ref <1.0)

## 2020-11-16 LAB — AMYLASE, PLEURAL OR PERITONEAL FLUID: Amylase, Fluid: 12 U/L

## 2020-11-16 LAB — FERRITIN: Ferritin: 239 ng/mL (ref 11–307)

## 2020-11-16 LAB — MAGNESIUM: Magnesium: 2 mg/dL (ref 1.7–2.4)

## 2020-11-16 LAB — PHOSPHORUS: Phosphorus: 2.3 mg/dL — ABNORMAL LOW (ref 2.5–4.6)

## 2020-11-16 MED ORDER — DEXMEDETOMIDINE HCL IN NACL 200 MCG/50ML IV SOLN
0.4000 ug/kg/h | INTRAVENOUS | Status: DC
  Administered 2020-11-16: 0.4 ug/kg/h via INTRAVENOUS
  Administered 2020-11-16: 0.5 ug/kg/h via INTRAVENOUS
  Filled 2020-11-16 (×2): qty 50

## 2020-11-16 MED ORDER — TRAMADOL HCL 50 MG PO TABS
50.0000 mg | ORAL_TABLET | Freq: Once | ORAL | Status: AC
  Administered 2020-11-16: 50 mg via ORAL
  Filled 2020-11-16: qty 1

## 2020-11-16 NOTE — Progress Notes (Signed)
PROGRESS NOTE Consult   Kimberly Stanley  [id number] DOB: Feb 28, 1966 DOA: 11/11/2020 PCP: Harlan Stains, MD  Brief Narrative:  -Patient presented with peritonitis, found to be COVID positive. Admitted to the Surgical team. Underwent Surgical intervention for perforated bowel on 11/11/20.  -Had dyspnea on presentation that has worsened post-operatively but patient reports not being able to take a deep breath due to abdominal pain post-operatively.  -Patient on PCA pump for pain management.  Patient with long-standing history of anxiety and on xanax 1mg  po x4 daily at home. Started on Ativan 0.5mg  IV q4h prn for anxiety on 11/12/20. Remains NPO with NG tube in place.  11/14/2020: Patient seen alongside patient's nurse.  Patient is currently on 8 L of supplemental oxygen with O2 sat of 94 to 95%.  We will continue to wean down supplemental oxygen to keep O2 sat equal to or greater than 91%.  Patient is currently on IV remdesivir, started on 11/12/2020.  Patient has no new complaints today.  11/15/2020: Patient continues to improve.  Supplemental oxygen is to 6 L/min via nasal cannula, and O2 sat is 93%.  We will titrate supplemental oxygen to keep O2 sat equal or greater than 91%.  11/16/2020: Patient continues to improve from respiratory point.  Patient is only on 3 L/min of supplemental oxygen.  No symptoms/signs of delirium noted at this moment.  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 NG tube in place. Continue pain management per primary team.  11/13/20 Patient to continue NG tube and to remain NPO for meds. Her pain is reported as better controlled. Patient encouraged to sit up, ambulate.   COVID 19 infection Acute hypoxic respiratory failure      - only symptom on presentation was cough and she didn't find it significant enough to attempt treatment at home     - she has atelectasis on the lung fields we can see from current imaging; will ordered dedicated CXR     - she was on 2L [place] on presentation with shallow breathing from abdominal pain as she will not take deep breathes on examination due to the pain.  11/12/20 Continue remdesivir, IS, and anti-tussives, follow inflammatory markers.  Mobilize as able and as recommended per primary team.  11/13/20 Patient encouraged to use IS, to sit up, and to take deep breaths using the pillow technique. 11/14/2020: Continue to wean down supplemental oxygen.  Complete course of remdesivir. 11/16/2020: Patient continues to improve.  Continue to titrate supplemental oxygen downwards.  Post-op normocytic anemia Hgb of 14.5 on presentation but likely due to hemoconcentration with unknown baseline Hgb.  Hgb today of 7.7.  Will check anemia panel, would transfuse if Hgb less than 7.  11/13/20 hgb improved overnight, question possible labs error yesterday. Iron low but ferritin borderline normal. Vitamin B12 and folate normal. Will continue to monitor.    Hypokalemia Potassium is 3.3 today.  Magnesium is 1.9. We  will continue to replete.  11/15/2020: Resolved.  Continue to monitor closely.  Hypomagnesemia: -Magnesium is 1.3 today. -Patient has received IV magnesium sulfate 4 g as well. -Continue monitor magnesium level.  Low phosphorus Phosphorus is 2.2. 11/15/2020: Resolved.  Phosphorus is 2.5 today.  DVT prophylaxis: Lovenox Code Status: Full Family Communication: None Disposition Plan: Pending post-op improvement. High risk of  worsening respiratory status due to abdominal pain and shallow breathing with COVID+ status.     Procedures:  Laparoscopy and drainage of bowel perforation with omental patch and lysis of adhesions on 11/11/20 by Dr. Michael Boston.   Antimicrobials:  Zosyn  Subjective: No new complaints. Patient reports having improved significantly. Continue to wean down supplemental oxygen.  Objective: Vitals:   11/16/20 1400 11/16/20 1600 11/16/20 1700 11/16/20 1800  BP: 127/65 126/70  127/75  Pulse: 79  71 70  Resp: (!) 25 20 20  (!) 25  Temp:      TempSrc:      SpO2: 95%  96% 98%  Weight:      Height:        Intake/Output Summary (Last 24 hours) at 11/16/2020 1844 Last data filed at 11/16/2020 1807 Gross per 24 hour  Intake 1616.47 ml  Output 865 ml  Net 751.47 ml   Filed Weights   11/11/20 2100  Weight: 92.2 kg    Examination:  General exam: no distress, resting in bed.  Respiratory system: Decreased air entry. Cardiovascular system: S1 & S2  Gastrointestinal system: Abdomen is obese, nontender, with drain.  Organs are difficult to assess. Central nervous system: Alert and oriented. No focal neurological deficits. Extremities: No leg edema  Data Reviewed: I have personally reviewed following labs  CBC: Recent Labs  Lab 11/12/20 0333 11/13/20 0316 11/14/20 0756 11/15/20 0236 11/16/20 0150  WBC 6.2 9.3 7.8 6.8 6.1  NEUTROABS 5.6 7.9* 6.3 5.4 4.3  HGB 7.7* 11.8* 11.2* 12.9 12.2  HCT 23.1* 36.8 34.2* 40.2 36.3  MCV 98.7 99.5 97.7 101.3* 94.5  PLT 114* 175 203 185 440   Basic Metabolic Panel: Recent Labs  Lab 11/11/20 1119 11/11/20 2110 11/13/20 0316 11/14/20 0756 11/15/20 0236 11/16/20 0150  NA 137  --  141 141 140 138  K 3.4*  --  3.2* 3.3* 5.4* 3.4*  CL 104  --  109 106 106 105  CO2 24  --  23 25 21* 22  GLUCOSE 102*  --  108* 94 94 104*  BUN 11  --  10 9 7 6   CREATININE 0.73  --  0.48 0.50 0.47 0.54  CALCIUM 8.6*  --  8.0* 8.4* 5.1* 8.0*  MG  --  1.9  1.9 1.9 1.3* 2.0  PHOS  --   --  1.9* 2.2* 2.5 2.3*   GFR: Estimated Creatinine Clearance: 92.3 mL/min (by C-G formula based on SCr of 0.54 mg/dL). Liver Function Tests: Recent Labs  Lab 11/11/20 1119 11/13/20 0316 11/14/20 0756 11/15/20 0236 11/16/20 0150  AST 26 27 17 17 18   ALT 28 47* 32 25 23  ALKPHOS 65 50 59 66 65  BILITOT 0.5 0.3 0.6 0.6 1.0  PROT 6.5 5.1* 5.5* 5.4* 5.1*  ALBUMIN 3.8 2.7* 2.8* 2.8* 2.6*   Recent Labs  Lab 11/11/20 1119 11/13/20 0316 11/16/20 0150  LIPASE 24  --   --   AMYLASE  --  35 32   No results for input(s): AMMONIA in the last 168 hours. Coagulation Profile: No results for input(s): INR, PROTIME in the  last 168 hours. Cardiac Enzymes: No results for input(s): CKTOTAL, CKMB, CKMBINDEX, TROPONINI in the last 168 hours. BNP (last 3 results) No results for input(s): PROBNP in the last 8760 hours. HbA1C: No results for input(s): HGBA1C in the last 72 hours. CBG: Recent Labs  Lab 11/15/20 1328  GLUCAP 89   Lipid Profile: No results for input(s): CHOL, HDL, LDLCALC, TRIG, CHOLHDL, LDLDIRECT in the last 72 hours. Thyroid Function Tests: No results for input(s): TSH, T4TOTAL, FREET4, T3FREE, THYROIDAB in the last 72 hours. Anemia Panel: Recent Labs    11/15/20 0236 11/16/20 0150  FERRITIN 196 239   Sepsis Labs: Recent Labs  Lab 11/11/20 1120  LATICACIDVEN 1.1    Recent Results (from the past 240 hour(s))  Resp Panel by RT-PCR (Flu A&B, Covid) Nasopharyngeal Swab     Status: Abnormal   Collection Time: 11/11/20 11:24 AM   Specimen: Nasopharyngeal Swab; Nasopharyngeal(NP) swabs in vial transport medium  Result Value Ref Range Status   SARS Coronavirus 2 by RT PCR POSITIVE (A) NEGATIVE Final    Comment: RESULT CALLED TO, READ BACK BY AND VERIFIED WITH: Elayne Guerin 11/11/20 @1327  BY SEEL,MOLLY (NOTE) SARS-CoV-2 target nucleic acids are DETECTED.  The SARS-CoV-2 RNA is generally detectable in upper respiratory specimens during  the acute phase of infection. Positive results are indicative of the presence of the identified virus, but do not rule out bacterial infection or co-infection with other pathogens not detected by the test. Clinical correlation with patient history and other diagnostic information is necessary to determine patient infection status. The expected result is Negative.  Fact Sheet for Patients: EntrepreneurPulse.com.au  Fact Sheet for Healthcare Providers: IncredibleEmployment.be  This test is not yet approved or cleared by the [place] FDA and  has been authorized for detection and/or diagnosis of SARS-CoV-2 by FDA under an Emergency Use Authorization (EUA).  This EUA will remain in effect (meaning this te st can be used) for the duration of  the COVID-19 declaration under Section 564(b)(1) of the Act, 21 U.S.C. section 360bbb-3(b)(1), unless the authorization is terminated or revoked sooner.     Influenza A by PCR NEGATIVE NEGATIVE Final   Influenza B by PCR NEGATIVE NEGATIVE Final    Comment: (NOTE) The Xpert Xpress SARS-CoV-2/FLU/RSV plus assay is intended as an aid in the diagnosis of influenza from Nasopharyngeal swab specimens and should not be used as a sole basis for treatment. Nasal washings and aspirates are unacceptable for Xpert Xpress SARS-CoV-2/FLU/RSV testing.  Fact Sheet for Patients: EntrepreneurPulse.com.au  Fact Sheet for Healthcare Providers: IncredibleEmployment.be  This test is not yet approved or cleared by the [place] FDA and has been authorized for detection and/or diagnosis of SARS-CoV-2 by FDA under an Emergency Use Authorization (EUA). This EUA will remain in effect (meaning this test can be used) for the duration of the COVID-19 declaration under Section 564(b)(1) of the Act, 21 U.S.C. section 360bbb-3(b)(1), unless the authorization is terminated  or revoked.  Performed at [facility], [place] [address], [place], [place] [address]   MRSA PCR Screening     Status: None   Collection Time: 11/11/20 10:00 PM   Specimen: Nasal Mucosa; Nasopharyngeal  Result Value Ref Range Status   MRSA by PCR NEGATIVE NEGATIVE Final    Comment:        The GeneXpert MRSA Assay (FDA approved for NASAL specimens only), is one component of a comprehensive MRSA colonization surveillance program. It is not intended to diagnose MRSA infection nor to  guide or monitor treatment for MRSA infections. Performed at [facility], [place] [address] [place], [place] [address]          Radiology Studies: No results found.      Scheduled Meds: . chlorhexidine  15 mL Mouth Rinse BID  . Chlorhexidine Gluconate Cloth  6 each Topical Daily  . heparin injection (subcutaneous)  5,000 Units Subcutaneous Q8H  . lip balm  1 application Topical BID  . mouth rinse  15 mL Mouth Rinse q12n4p  . pantoprazole  40 mg Intravenous Q12H  . sodium chloride flush  3 mL Intravenous Q12H   Continuous Infusions: . sodium chloride 10 mL/hr at 11/16/20 1800  . dexmedetomidine (PRECEDEX) IV infusion Stopped (11/16/20 9562)  . lactated ringers 50 mL/hr at 11/16/20 1800  . methocarbamol (ROBAXIN) IV Stopped (11/16/20 1400)  . piperacillin-tazobactam (ZOSYN)  IV Stopped (11/16/20 1620)     LOS: 5 days    Time spent:  25 minutes   Bonnell Public, MD [facility]   If 7PM-7AM, please contact night-coverage [email or web address] Password [facility] 11/16/2020, 6:44 PM

## 2020-11-16 NOTE — Progress Notes (Signed)
precedex added d/t continued agitation, shaking bed, and hallucinations.  Pt hallucinations include: being in a pool, seeing babies, disoriented thinking to why she's here

## 2020-11-16 NOTE — Progress Notes (Signed)
5 Days Post-Op   Subjective/Chief Complaint: Confused, requiring restraints, hallucinations after midnight   Objective: Vital signs in last 24 hours: Temp:  [97.4 F (36.3 C)-98.6 F (37 C)] 98.6 F (37 C) (01/10 0400) Pulse Rate:  [68-100] 100 (01/10 0400) Resp:  [0-36] 36 (01/10 0600) BP: (105-155)/(45-125) 146/120 (01/10 0600) SpO2:  [91 %-99 %] 96 % (01/10 0600) FiO2 (%):  [0 %] 0 % (01/09 1633) Last BM Date:  (PTA)  Intake/Output from previous day: 01/09 0701 - 01/10 0700 In: 1727.2 [I.V.:1090.3; IV Piggyback:636.9] Out: 610 [Urine:600; Drains:10] Intake/Output this shift: No intake/output data recorded.  Exam: Will wake, follow some commands but confused Abdomen soft, obese, mildly tender, no peritonitis   Lab Results:  Recent Labs    11/15/20 0236 11/16/20 0150  WBC 6.8 6.1  HGB 12.9 12.2  HCT 40.2 36.3  PLT 185 270   BMET Recent Labs    11/15/20 0236 11/16/20 0150  NA 140 138  K 5.4* 3.4*  CL 106 105  CO2 21* 22  GLUCOSE 94 104*  BUN 7 6  CREATININE 0.47 0.54  CALCIUM 5.1* 8.0*   PT/INR No results for input(s): LABPROT, INR in the last 72 hours. ABG No results for input(s): PHART, HCO3 in the last 72 hours.  Invalid input(s): PCO2, PO2  Studies/Results: No results found.  Anti-infectives: Anti-infectives (From admission, onward)   Start     Dose/Rate Route Frequency Ordered Stop   11/12/20 1000  remdesivir 100 mg in sodium chloride 0.9 % 100 mL IVPB       "Followed by" Linked Group Details   100 mg 200 mL/hr over 30 Minutes Intravenous Daily 11/11/20 1837 11/15/20 1216   11/11/20 2200  piperacillin-tazobactam (ZOSYN) IVPB 3.375 g        3.375 g 12.5 mL/hr over 240 Minutes Intravenous Every 8 hours 11/11/20 2044 11/16/20 2159   11/11/20 2000  remdesivir 200 mg in sodium chloride 0.9% 250 mL IVPB       "Followed by" Linked Group Details   200 mg 580 mL/hr over 30 Minutes Intravenous Once 11/11/20 1837 11/11/20 2137   11/11/20 1330   piperacillin-tazobactam (ZOSYN) IVPB 3.375 g        3.375 g 12.5 mL/hr over 240 Minutes Intravenous  Once 11/11/20 1327 11/11/20 1733      Assessment/Plan: s/p Procedure(s): LAPAROSCOPY AND DRAINAGE OF BOWEL PERFORATION WITH OMENTAL GRAHAM PATCH FOR ULCER (N/A) LAPAROSCOPIC LYSIS OF ADHESIONS  COVID - 19 infection - TRH seeing HTN Hypothyroidism  Depression Anxiety/Confusion, may be multifactorial given Covid, surgery, previous home meds ABL anemia - stable  Hx of Roux-en Y gastric bypass 2014 Hx of perforated marginal ulcer at [place] with diagnostic laparoscopy and drainage of collection 2016 Dr. Hassell Done Perforation at [place] anastomosis S/p laparoscopic graham patch with drain placement 11/13/20 Dr. Johney Maine - POD#4 For UGI today to look for leak if she can participate with radiology from a confusion standpoint  FEN: NPO until UGI VTE: SQ heparin  ID: [email or web address] 1/5>>  LOS: 5 days    Coralie Keens MD 11/16/2020

## 2020-11-16 NOTE — TOC Initial Note (Signed)
Transition of Care [facility]) - Initial/Assessment Note    Patient Details  Name: Kimberly Stanley MRN: [id number] Date of Birth: Apr 05, 1966  [facility] CM/SW Contact:    Leeroy Cha, RN Phone Number: 11/16/2020, 11:34 AM  Clinical Narrative:                 5 Days Post-Op   Subjective/Chief Complaint: Confused, requiring restraints, hallucinations after midnight   Objective: Vital signs in last 24 hours: Temp:  [97.4 F (36.3 C)-98.6 F (37 C)] 98.6 F (37 C) (01/10 0400) Pulse Rate:  [68-100] 100 (01/10 0400) Resp:  [0-36] 36 (01/10 0600) BP: (105-155)/(45-125) 146/120 (01/10 0600) SpO2:  [91 %-99 %] 96 % (01/10 0600) FiO2 (%):  [0 %] 0 % (01/09 1633) Last BM Date:  (PTA)  Intake/Output from previous day: 01/09 0701 - 01/10 0700 In: 1727.2 [I.V.:1090.3; IV Piggyback:636.9] Out: 610 [Urine:600; Drains:10] Intake/Output this shift: No intake/output data recorded.  Exam: Will wake, follow some commands but confused Abdomen soft, obese, mildly tender, no peritonitis PLAN: FOLLOWING FOR PROGRESSION PLAN IS UNDETERMINED AT THIS TIME. Expected Discharge Plan: Home/Self Care Barriers to Discharge: Continued Medical Work up   Patient Goals and CMS Choice Patient states their goals for this hospitalization and ongoing recovery are:: TO [place] CMS Medicare.gov Compare Post Acute Care list provided to:: Patient Choice offered to / list presented to : Patient  Expected Discharge Plan and Services Expected Discharge Plan: Home/Self Care   Discharge Planning Services: CM Consult   Living arrangements for the past 2 months: Single Family Home                                      Prior Living Arrangements/Services Living arrangements for the past 2 months: Single Family Home Lives with:: Spouse Patient language and need for interpreter reviewed:: Yes Do you feel safe going back to the place where you live?: Yes      Need for Family  Participation in Patient Care: Yes (Comment) Care giver support system in place?: Yes (comment)   Criminal Activity/Legal Involvement Pertinent to Current Situation/Hospitalization: No - Comment as needed  Activities of Daily Living Home Assistive Devices/Equipment: Eyeglasses ADL Screening (condition at time of admission) Patient's cognitive ability adequate to safely complete daily activities?: Yes Is the patient deaf or have difficulty hearing?: No Does the patient have difficulty seeing, even when wearing glasses/contacts?: No Does the patient have difficulty concentrating, remembering, or making decisions?: No Patient able to express need for assistance with ADLs?: Yes Does the patient have difficulty dressing or bathing?: No Independently performs ADLs?: Yes (appropriate for developmental age) Does the patient have difficulty walking or climbing stairs?: No Weakness of Legs: Both Weakness of Arms/Hands: Both  Permission Sought/Granted                  Emotional Assessment Appearance:: Appears stated age Attitude/Demeanor/Rapport: Engaged Affect (typically observed): Calm Orientation: : Oriented to Place,Oriented to Self,Oriented to  Time,Oriented to Situation Alcohol / Substance Use: Not Applicable Psych Involvement: No (comment)  Admission diagnosis:  Gastrojejunal ulcer with perforation ([place]) [K28.5] Bowel perforation ([place]) [K63.1] Intra-abdominal free air of unknown etiology [K66.8] Pneumonia due to COVID-19 virus [U07.1, J12.82] COVID-19 [U07.1] Patient Active Problem List   Diagnosis Date Noted  . Panic attacks 11/15/2020  . Hypokalemia 11/14/2020  . Hypophosphatemia 11/14/2020  . Pneumonia due to COVID-19 virus 11/14/2020  .  Acute respiratory failure with hypoxia ([place]) 11/14/2020  . Alcohol abuse, uncomplicated 16/08/9603  . Bipolar 2 disorder ([place]) 11/11/2020  . Eating disorder 11/11/2020  . Edema 11/11/2020  . Esophageal dysphagia 11/11/2020  .  Gastroesophageal reflux disease 11/11/2020  . Genital herpes simplex 11/11/2020  . Iron deficiency anemia 11/11/2020  . Vitamin D deficiency 11/11/2020  . Personal history of other specified conditions 11/11/2020  . Peptic ulcer disease 11/11/2020  . 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 Monoallelic mutation of CHEK2 gene in female patient 09/19/2018  . Genetic testing 08/23/2018  . Family history of breast cancer   . Family history of pancreatic cancer   . Cutaneous leiomyoma   . Intra-abdominal free air of unknown etiology 09/23/2015  . 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 Preseptal cellulitis 12/20/2011  . Leukocytosis 12/20/2011  . HTN (hypertension) 12/20/2011  . Anxiety and depression 12/20/2011  . Hypothyroidism 12/20/2011  . Abscess of forehead 12/20/2011   PCP:  Harlan Stains, MD Pharmacy:   [facility] [place], [place] - [address] AT [place] [address] Phone: [phone] Fax: [phone]     Social Determinants of Health (SDOH) Interventions    Readmission Risk Interventions No flowsheet data found.

## 2020-11-16 NOTE — Progress Notes (Signed)
Pt found out of bed twice. JP bulb partially pulled out (stitches partially compromised, New tegaderm placed & dated). (2) IVs pulled out by pt on this shift. IV team placed new access x3. Floor mats in place, bed alarm on. Sitter came around 2310p & is sitting at bedside. Wrist restraints & posey belt in place.   Husband Orene Desanctis) has been updated and viewed pt via videochat  Pt still having active hallucinations, however, goes in & out of being completely oriented

## 2020-11-16 NOTE — Progress Notes (Signed)
Attempted video chat 5 time unsuccessful. Safety sitter able to face time husband from Pt's cell.

## 2020-11-17 ENCOUNTER — Inpatient Hospital Stay (HOSPITAL_COMMUNITY): Payer: BC Managed Care – PPO

## 2020-11-17 LAB — BASIC METABOLIC PANEL
Anion gap: 14 (ref 5–15)
BUN: 6 mg/dL (ref 6–20)
CO2: 19 mmol/L — ABNORMAL LOW (ref 22–32)
Calcium: 8.4 mg/dL — ABNORMAL LOW (ref 8.9–10.3)
Chloride: 106 mmol/L (ref 98–111)
Creatinine, Ser: 0.57 mg/dL (ref 0.44–1.00)
GFR, Estimated: >60 mL/min (ref >60)
Glucose, Bld: 90 mg/dL (ref 70–99)
Potassium: 2.8 mmol/L — ABNORMAL LOW (ref 3.5–5.1)
Sodium: 139 mmol/L (ref 135–145)

## 2020-11-17 LAB — CBC
HCT: 36 % (ref 36.0–46.0)
Hemoglobin: 12.3 g/dL (ref 12.0–15.0)
MCH: 32.1 pg (ref 26.0–34.0)
MCHC: 34.2 g/dL (ref 30.0–36.0)
MCV: 94 fL (ref 80.0–100.0)
Platelets: 332 10*3/uL (ref 150–400)
RBC: 3.83 MIL/uL — ABNORMAL LOW (ref 3.87–5.11)
RDW: 12 % (ref 11.5–15.5)
WBC: 7.8 10*3/uL (ref 4.0–10.5)
nRBC: 0 % (ref 0.0–0.2)

## 2020-11-17 MED ORDER — LORAZEPAM 2 MG/ML IJ SOLN: 1.0000 mg | Freq: Four times a day (QID) | INTRAMUSCULAR | Status: DC | PRN

## 2020-11-17 MED ORDER — AMPHETAMINE-DEXTROAMPHETAMINE 20 MG PO TABS: 20.0000 mg | ORAL_TABLET | Freq: Four times a day (QID) | ORAL | Status: DC | PRN

## 2020-11-17 MED ORDER — IOHEXOL 300 MG/ML  SOLN
50.0000 mL | Freq: Once | INTRAMUSCULAR | Status: AC | PRN
  Administered 2020-11-17: 50 mL via ORAL

## 2020-11-17 MED ORDER — BUSPIRONE HCL 10 MG PO TABS
30.0000 mg | ORAL_TABLET | Freq: Two times a day (BID) | ORAL | Status: DC
  Administered 2020-11-17 – 2020-11-19 (×5): 30 mg via ORAL
  Filled 2020-11-17 (×2): qty 3
  Filled 2020-11-17: qty 6
  Filled 2020-11-17: qty 3
  Filled 2020-11-17: qty 6
  Filled 2020-11-17: qty 3

## 2020-11-17 MED ORDER — OXYCODONE HCL 5 MG PO TABS
5.0000 mg | ORAL_TABLET | ORAL | Status: DC | PRN
  Administered 2020-11-18: 5 mg via ORAL
  Filled 2020-11-17: qty 1

## 2020-11-17 MED ORDER — ALPRAZOLAM 1 MG PO TABS
1.0000 mg | ORAL_TABLET | Freq: Four times a day (QID) | ORAL | Status: DC | PRN
  Administered 2020-11-17 – 2020-11-19 (×5): 1 mg via ORAL
  Filled 2020-11-17 (×5): qty 1

## 2020-11-17 MED ORDER — ALPRAZOLAM 1 MG PO TABS: 1.0000 mg | ORAL_TABLET | Freq: Four times a day (QID) | ORAL | Status: DC | PRN

## 2020-11-17 MED ORDER — POTASSIUM CHLORIDE IN NACL 40-0.9 MEQ/L-% IV SOLN
INTRAVENOUS | Status: AC
  Administered 2020-11-17 – 2020-11-18 (×2): 100 mL/h via INTRAVENOUS
  Filled 2020-11-17 (×4): qty 1000

## 2020-11-17 MED ORDER — BUSPIRONE HCL 10 MG PO TABS: 30.0000 mg | ORAL_TABLET | Freq: Two times a day (BID) | ORAL | Status: DC | PRN

## 2020-11-17 MED ORDER — POTASSIUM CHLORIDE 2 MEQ/ML IV SOLN
INTRAVENOUS | Status: DC
  Filled 2020-11-17: qty 1000

## 2020-11-17 MED ORDER — OXYCODONE HCL 5 MG PO TABS
10.0000 mg | ORAL_TABLET | ORAL | Status: DC | PRN
  Administered 2020-11-17: 10 mg via ORAL
  Filled 2020-11-17: qty 2

## 2020-11-17 NOTE — Progress Notes (Signed)
Patient quite anxious and tearful after having had 4 loose stools-she received apple juice this afternoon-upper GI study negative for any leak She tolerated p.o. fine without nausea or vomiting She has no pain She is on room air  Given the fact that her issues are primarily surgical (prior lap band ulcer of abdomen and now recent perforation at [place] anastomosis surgery with Phillip Heal patch and drain placement 11/13/2020), I will ensure that she is on the right meds and reconcile her meds.  Otherwise internal medicine does not have too much to offer in terms of other management strategies-I am here through next Tuesday 1/18 if there are any questions, please feel free to reach out but I will not plan on seeing her again 1/12  My understanding is she will transition to [place] and I think this would do well for her given she has not been able to sleep in the negative pressure room  Thank you for the consult, please contact me on either my cell phone or epic chat  Verneita Griffes, MD [facility] 4:50 PM

## 2020-11-17 NOTE — Progress Notes (Signed)
6 Days Post-Op   Subjective/Chief Complaint: Feels better today.  Still anxious   Objective: Vital signs in last 24 hours: Temp:  [97.4 F (36.3 C)-99 F (37.2 C)] 97.7 F (36.5 C) (01/11 0815) Pulse Rate:  [56-92] 63 (01/11 0600) Resp:  [15-29] 24 (01/11 0600) BP: (115-143)/(51-80) 135/63 (01/11 0600) SpO2:  [91 %-100 %] 97 % (01/11 0600) Last BM Date: 11/16/20  Intake/Output from previous day: 01/10 0701 - 01/11 0700 In: 690.6 [I.V.:515.6; IV Piggyback:175] Out: 268 [Urine:250; Drains:18] Intake/Output this shift: No intake/output data recorded.  Exam: Awake and alert Following commands Appropriate Abdomen soft with minimal tenderness  Lab Results:  Recent Labs    11/16/20 0150 11/17/20 0303  WBC 6.1 7.8  HGB 12.2 12.3  HCT 36.3 36.0  PLT 270 332   BMET Recent Labs    11/16/20 0150 11/17/20 0303  NA 138 139  K 3.4* 2.8*  CL 105 106  CO2 22 19*  GLUCOSE 104* 90  BUN 6 6  CREATININE 0.54 0.57  CALCIUM 8.0* 8.4*   PT/INR No results for input(s): LABPROT, INR in the last 72 hours. ABG No results for input(s): PHART, HCO3 in the last 72 hours.  Invalid input(s): PCO2, PO2  Studies/Results: No results found.  Anti-infectives: Anti-infectives (From admission, onward)   Start     Dose/Rate Route Frequency Ordered Stop   11/12/20 1000  remdesivir 100 mg in sodium chloride 0.9 % 100 mL IVPB       "Followed by" Linked Group Details   100 mg 200 mL/hr over 30 Minutes Intravenous Daily 11/11/20 1837 11/15/20 1216   11/11/20 2200  piperacillin-tazobactam (ZOSYN) IVPB 3.375 g        3.375 g 12.5 mL/hr over 240 Minutes Intravenous Every 8 hours 11/11/20 2044 11/16/20 2159   11/11/20 2000  remdesivir 200 mg in sodium chloride 0.9% 250 mL IVPB       "Followed by" Linked Group Details   200 mg 580 mL/hr over 30 Minutes Intravenous Once 11/11/20 1837 11/11/20 2137   11/11/20 1330  piperacillin-tazobactam (ZOSYN) IVPB 3.375 g        3.375 g 12.5 mL/hr  over 240 Minutes Intravenous  Once 11/11/20 1327 11/11/20 1733      Assessment/Plan: s/p Procedure(s): LAPAROSCOPY AND DRAINAGE OF BOWEL PERFORATION WITH OMENTAL GRAHAM PATCH FOR ULCER (N/A) LAPAROSCOPIC LYSIS OF ADHESIONS   COVID - 19 infection - TRH seeing HTN Hypothyroidism  Depression Anxiety/Confusion, may be multifactorial given Covid, surgery, previous home meds ABL anemia - stable  Hx of Roux-en Y gastric bypass 2014 Hx of perforated marginal ulcer at [place] with diagnostic laparoscopy and drainage of collection 2016 Dr. Hassell Done Perforation at [place] anastomosis S/p laparoscopic graham patch with drain placement 11/13/20 Dr. Johney Maine    LOS: 6 days    For upper GI today to evaluate for leak.  If negative, will start po Potential transfer to floor today Continue antibiotics    Coralie Keens MD 11/17/2020

## 2020-11-17 NOTE — Progress Notes (Signed)
PT Cancellation Note  Patient Details Name: Kimberly Stanley MRN: [id number] DOB: 14-Jul-1966   Cancelled Treatment:    Reason Eval/Treat Not Completed: Medical issues which prohibited therapy, RN states patient is anxious. Is getting up to BR. Will check back another time.   Claretha Cooper 11/17/2020, 9:38 AM [place] Pager [phone] Office [phone]

## 2020-11-18 LAB — BASIC METABOLIC PANEL
Anion gap: 6 (ref 5–15)
BUN: <5 mg/dL — ABNORMAL LOW (ref 6–20)
CO2: 23 mmol/L (ref 22–32)
Calcium: 8.6 mg/dL — ABNORMAL LOW (ref 8.9–10.3)
Chloride: 111 mmol/L (ref 98–111)
Creatinine, Ser: 0.51 mg/dL (ref 0.44–1.00)
GFR, Estimated: >60 mL/min (ref >60)
Glucose, Bld: 104 mg/dL — ABNORMAL HIGH (ref 70–99)
Potassium: 3.5 mmol/L (ref 3.5–5.1)
Sodium: 140 mmol/L (ref 135–145)

## 2020-11-18 NOTE — Plan of Care (Signed)
  Problem: Education: Goal: Understanding of discharge needs will improve Outcome: Progressing Goal: Verbalization of understanding of the causes of altered bowel function will improve Outcome: Progressing   Problem: Activity: Goal: Ability to tolerate increased activity will improve Outcome: Progressing   Problem: Bowel/Gastric: Goal: Gastrointestinal status for postoperative course will improve Outcome: Progressing   Problem: Health Behavior/Discharge Planning: Goal: Identification of community resources to assist with postoperative recovery needs will improve Outcome: Progressing   Problem: Nutritional: Goal: Will attain and maintain optimal nutritional status will improve Outcome: Progressing   Problem: Clinical Measurements: Goal: Postoperative complications will be avoided or minimized Outcome: Progressing   Problem: Respiratory: Goal: Respiratory status will improve Outcome: Progressing   Problem: Skin Integrity: Goal: Will show signs of wound healing Outcome: Progressing   Problem: Education: Goal: Knowledge of risk factors and measures for prevention of condition will improve Outcome: Progressing   Problem: Coping: Goal: Psychosocial and spiritual needs will be supported Outcome: Progressing   Problem: Respiratory: Goal: Will maintain a patent airway Outcome: Progressing Goal: Complications related to the disease process, condition or treatment will be avoided or minimized Outcome: Progressing   Problem: Safety: Goal: Non-violent Restraint(s) Outcome: Progressing   Problem: Education: Goal: Knowledge of General Education information will improve Description: Including pain rating scale, medication(s)/side effects and non-pharmacologic comfort measures Outcome: Progressing   Problem: Health Behavior/Discharge Planning: Goal: Ability to manage health-related needs will improve Outcome: Progressing   Problem: Clinical Measurements: Goal: Ability to  maintain clinical measurements within normal limits will improve Outcome: Progressing Goal: Will remain free from infection Outcome: Progressing Goal: Diagnostic test results will improve Outcome: Progressing Goal: Respiratory complications will improve Outcome: Progressing Goal: Cardiovascular complication will be avoided Outcome: Progressing   Problem: Activity: Goal: Risk for activity intolerance will decrease Outcome: Progressing   Problem: Nutrition: Goal: Adequate nutrition will be maintained Outcome: Progressing   Problem: Coping: Goal: Level of anxiety will decrease Outcome: Progressing   Problem: Elimination: Goal: Will not experience complications related to bowel motility Outcome: Progressing Goal: Will not experience complications related to urinary retention Outcome: Progressing   Problem: Pain Managment: Goal: General experience of comfort will improve Outcome: Progressing   Problem: Safety: Goal: Ability to remain free from injury will improve Outcome: Progressing   Problem: Skin Integrity: Goal: Risk for impaired skin integrity will decrease Outcome: Progressing

## 2020-11-18 NOTE — Evaluation (Addendum)
Physical Therapy Evaluation Patient Details Name: Kimberly Stanley MRN: [id number] DOB: 01/11/1966 Today's Date: 11/18/2020   History of Present Illness  Kimberly Stanley is a 55 y.o. female with medical history significant of anxiety, gastric bypass and hypothyroidism. Presenting with complaints of RLQ abdominal  pain.S/P Procedure(s):  LAPAROSCOPY AND DRAINAGE OF BOWEL PERFORATION WITH OMENTAL GRAHAM PATCH FOR ULCER (N/A)  LAPAROSCOPIC LYSIS OF ADHESIONS. Patient is  covid positive  Clinical Impression  Patient is weak  And required steady assistance to ambulate in room with hand  Hold. SPO2 95%. Patient should progress to Dc home with spouse as caregiver. Pt admitted with above diagnosis.  Pt currently with functional limitations due to the deficits listed below (see PT Problem List). Pt will benefit from skilled PT to increase their independence and safety with mobility to allow discharge to the venue listed below.   Patient  Will benefit from OT while in hospital. Please order.    Follow Up Recommendations Home health PT    Equipment Recommendations  Rolling walker with 5" wheels    Recommendations for Other Services   OT--    Precautions / Restrictions Precautions Precautions: Fall Precaution Comments: left drain Restrictions Weight Bearing Restrictions: No      Mobility  Bed Mobility Overal bed mobility: Needs Assistance Bed Mobility: Sidelying to Sit;Rolling Rolling: Min guard Sidelying to sit: Min assist       General bed mobility comments: cues for log rolling, assist witn trunkl    Transfers Overall transfer level: Needs assistance Equipment used: None Transfers: Sit to/from Stand Sit to Stand: Min assist            Ambulation/Gait Ambulation/Gait assistance: Min assist Gait Distance (Feet): 20 Feet (x 2) Assistive device: 2 person hand held assist Gait Pattern/deviations: Step-through pattern;Staggering right;Staggering left Gait velocity:  decr   General Gait Details: Steady assist  required for balance and HHA.  Stairs            Wheelchair Mobility    Modified Rankin (Stroke Patients Only)       Balance Overall balance assessment: Needs assistance Sitting-balance support: Feet supported;No upper extremity supported Sitting balance-Leahy Scale: Good     Standing balance support: During functional activity;No upper extremity supported Standing balance-Leahy Scale: Poor Standing balance comment: requires steady assistnace for balance,                             Pertinent Vitals/Pain Pain Assessment: Faces Faces Pain Scale: Hurts little more Pain Location: abdomen Pain Descriptors / Indicators: Discomfort Pain Intervention(s): Monitored during session;Premedicated before session    Home Living Family/patient expects to be discharged to:: Private residence Living Arrangements: Spouse/significant other Available Help at Discharge: Family Type of Home: House Home Access: Stairs to enter   Technical brewer of Steps: 3-4 Home Layout: Two level;Able to live on main level with bedroom/bathroom Home Equipment: None      Prior Function Level of Independence: Independent               Hand Dominance        Extremity/Trunk Assessment   Upper Extremity Assessment Upper Extremity Assessment: Generalized weakness    Lower Extremity Assessment Lower Extremity Assessment: Generalized weakness       Communication   Communication: No difficulties  Cognition Arousal/Alertness: Awake/alert Behavior During Therapy: WFL for tasks assessed/performed;Anxious Overall Cognitive Status: Within Functional Limits for tasks assessed  General Comments      Exercises     Assessment/Plan    PT Assessment Patient needs continued PT services  PT Problem List Decreased strength;Decreased mobility;Decreased activity  tolerance;Decreased balance;Decreased knowledge of use of DME       PT Treatment Interventions Gait training;Therapeutic activities;Patient/family education;Stair training;Functional mobility training    PT Goals (Current goals can be found in the Care Plan section)  Acute Rehab PT Goals Patient Stated Goal: to go home, see my grand baby PT Goal Formulation: With patient Time For Goal Achievement: 12/02/20 Potential to Achieve Goals: Good    Frequency Min 3X/week   Barriers to discharge        Co-evaluation               AM-PAC PT "6 Clicks" Mobility  Outcome Measure Help needed turning from your back to your side while in a flat bed without using bedrails?: A Little Help needed moving from lying on your back to sitting on the side of a flat bed without using bedrails?: A Little Help needed moving to and from a bed to a chair (including a wheelchair)?: A Little Help needed standing up from a chair using your arms (e.g., wheelchair or bedside chair)?: A Little Help needed to walk in hospital room?: A Lot Help needed climbing 3-5 steps with a railing? : A Lot 6 Click Score: 16    End of Session   Activity Tolerance: Patient tolerated treatment well;Patient limited by fatigue Patient left: in chair;with call bell/phone within reach;with nursing/sitter in room Nurse Communication: Mobility status PT Visit Diagnosis: Unsteadiness on feet (R26.81);Difficulty in walking, not elsewhere classified (R26.2)    Time: 4854-6270 PT Time Calculation (min) (ACUTE ONLY): 36 min   Charges:   PT Evaluation $PT Eval Low Complexity: 1 Low PT Treatments $Gait Training: 8-22 mins        Kimberly Stanley PT Acute Rehabilitation Services Pager [phone] Office [phone]   Kimberly Stanley 11/18/2020, 5:26 PM

## 2020-11-18 NOTE — Progress Notes (Signed)
7 Days Post-Op   Subjective/Chief Complaint: Continuing to feel better Tolerated po    Objective: Vital signs in last 24 hours: Temp:  [97.5 F (36.4 C)-98.5 F (36.9 C)] 98 F (36.7 C) (01/12 0800) Pulse Rate:  [60-84] 60 (01/12 0900) Resp:  [15-35] 16 (01/12 0900) BP: (122-174)/(64-120) 144/66 (01/12 0900) SpO2:  [96 %-99 %] 98 % (01/12 0900) Last BM Date: 11/18/20  Intake/Output from previous day: 01/11 0701 - 01/12 0700 In: 1292.7 [P.O.:880; I.V.:212.7; IV Piggyback:200] Out: 915 [Urine:850; Drains:65] Intake/Output this shift: Total I/O In: 2340.9 [I.V.:1942.3; IV Piggyback:398.7] Out: -   Exam: Awake and alert Comfortable Abdomen soft, drain serosang  Lab Results:  Recent Labs    11/16/20 0150 11/17/20 0303  WBC 6.1 7.8  HGB 12.2 12.3  HCT 36.3 36.0  PLT 270 332   BMET Recent Labs    11/16/20 0150 11/17/20 0303  NA 138 139  K 3.4* 2.8*  CL 105 106  CO2 22 19*  GLUCOSE 104* 90  BUN 6 6  CREATININE 0.54 0.57  CALCIUM 8.0* 8.4*   PT/INR No results for input(s): LABPROT, INR in the last 72 hours. ABG No results for input(s): PHART, HCO3 in the last 72 hours.  Invalid input(s): PCO2, PO2  Studies/Results: DG UGI W SINGLE CM (SOL OR THIN BA)  Result Date: 11/17/2020 CLINICAL DATA:  History of gastric bypass surgery with anastomotic perforated ulcer. Status post surgical fixation. EXAM: WATER SOLUBLE UPPER GI SERIES TECHNIQUE: Single-column upper GI series was performed using water soluble contrast. CONTRAST:  16mL OMNIPAQUE IOHEXOL 300 MG/ML  SOLN COMPARISON:  CT scan 11/11/2020 FLUOROSCOPY TIME:  Fluoroscopy Time:  0 minutes and 42 seconds Radiation Exposure Index (if provided by the fluoroscopic device): 16.8 mGy Number of Acquired Spot Images: 3 FINDINGS: The small residual gastric pouch appears normal. The gastrojejunal anastomosis is intact. No leakage/extravasation of contrast is identified. The proximal jejunum appears normal. IMPRESSION: 1.  Status post gastric bypass surgery with recent perforated ulcer status post omental Phillip Heal patch. 2. No leaking/extravasated oral contrast is identified. Normal appearance of the gastric pouch and jejunum. Electronically Signed   By: Marijo Sanes M.D.   On: 11/17/2020 11:03    Anti-infectives: Anti-infectives (From admission, onward)   Start     Dose/Rate Route Frequency Ordered Stop   11/12/20 1000  remdesivir 100 mg in sodium chloride 0.9 % 100 mL IVPB       "Followed by" Linked Group Details   100 mg 200 mL/hr over 30 Minutes Intravenous Daily 11/11/20 1837 11/15/20 1216   11/11/20 2200  piperacillin-tazobactam (ZOSYN) IVPB 3.375 g        3.375 g 12.5 mL/hr over 240 Minutes Intravenous Every 8 hours 11/11/20 2044 11/16/20 2159   11/11/20 2000  remdesivir 200 mg in sodium chloride 0.9% 250 mL IVPB       "Followed by" Linked Group Details   200 mg 580 mL/hr over 30 Minutes Intravenous Once 11/11/20 1837 11/11/20 2137   11/11/20 1330  piperacillin-tazobactam (ZOSYN) IVPB 3.375 g        3.375 g 12.5 mL/hr over 240 Minutes Intravenous  Once 11/11/20 1327 11/11/20 1733      Assessment/Plan: s/p Procedure(s): LAPAROSCOPY AND DRAINAGE OF BOWEL PERFORATION WITH OMENTAL GRAHAM PATCH FOR ULCER (N/A) LAPAROSCOPIC LYSIS OF ADHESIONS   No evidence of leak on UGI Advance diet to regular diet Waiting for a floor bed Potential discharge in next 1 to 2 days  LOS: 7 days  Coralie Keens 11/18/2020

## 2020-11-18 NOTE — Discharge Instructions (Signed)
TAKE A PPI ANTACID MEDICATION EVERY DAY THE REST OF YOUR LIFE!   Peptic Ulcer  A peptic ulcer is a sore in the lining of the stomach (gastric ulcer) or the first part of the small intestine (duodenal ulcer). The ulcer causes a gradual wearing away (erosion) of the deeper tissue. What are the causes? Normally, the lining of the stomach and the small intestine protects them from the acid that digests food. The protective lining can be damaged by:  An infection caused by a type of bacteria called Helicobacter pylori or H. pylori.  Regular use of NSAIDs, such as ibuprofen or aspirin.  Rare tumors in the stomach, small intestine, or pancreas (Zollinger-Ellison syndrome). What increases the risk? The following factors may make you more likely to develop this condition:  Smoking.  Having a family history of ulcer disease.  Drinking alcohol.  Having been hospitalized in an intensive care unit (ICU). What are the signs or symptoms? Symptoms of this condition include:  Persistent burning pain in the area between the chest and the belly button. The pain may be worse on an empty stomach and at night.  Heartburn.  Nausea and vomiting.  Bloating. If the ulcer results in bleeding, it can cause:  Black, tarry stools.  Vomiting of bright red blood.  Vomiting of material that looks like coffee grounds. How is this diagnosed? This condition may be diagnosed based on:  Your medical history and a physical exam.  Various tests or procedures, such as: ? Blood tests, stool tests, or breath tests to check for the H. pylori bacteria. ? An X-ray exam (upper gastrointestinal series) of the esophagus, stomach, and small intestine. ? Upper endoscopy. The health care provider examines the esophagus, stomach, and small intestine using a small flexible tube that has a video camera at the end. ? Biopsy. A tissue sample is removed to be examined under a microscope. How is this treated? Treatment for  this condition may include:  Eliminating the cause of the ulcer, such as smoking or use of NSAIDs, and limiting alcohol and caffeine intake.  Medicines to reduce the amount of acid in your digestive tract.  Antibiotic medicines, if the ulcer is caused by an H. pylori infection.  An upper endoscopy may be used to treat a bleeding ulcer.  Surgery. This may be needed if the bleeding is severe or if the ulcer created a hole somewhere in the digestive system. Follow these instructions at home:  Do not drink alcohol if your health care provider tells you not to drink.  Do not use any products that contain nicotine or tobacco, such as cigarettes, e-cigarettes, and chewing tobacco. If you need help quitting, ask your health care provider.  Take over-the-counter and prescription medicines only as told by your health care provider. ? Do not use over-the-counter medicines in place of prescription medicines unless your health care provider approves. ? Do not take aspirin, ibuprofen, or other NSAIDs unless your health care provider told you to do so.  Take over-the-counter and prescription medicines only as told by your health care provider.  Keep all follow-up visits as told by your health care provider. This is important. Contact a health care provider if:  Your symptoms do not improve within 7 days of starting treatment.  You have ongoing indigestion or heartburn. Get help right away if:  You have sudden, sharp, or persistent pain in your abdomen.  You have bloody or dark black, tarry stools.  You vomit blood or material  that looks like coffee grounds.  You become light-headed or you feel faint.  You become weak.  You become sweaty or clammy. Summary  A peptic ulcer is a sore in the lining of the stomach (gastric ulcer) or the first part of the small intestine (duodenal ulcer). The ulcer causes a gradual wearing away (erosion) of the deeper tissue.  Do not use any products that  contain nicotine or tobacco, such as cigarettes, e-cigarettes, and chewing tobacco. If you need help quitting, ask your health care provider.  Take over-the-counter and prescription medicines only as told by your health care provider. Do not use over-the-counter medicines in place of prescription medicines unless your health care provider approves.  Contact your health care provider if you have ongoing indigestion or heartburn.  Keep all follow-up visits as told by your health care provider. This is important. This information is not intended to replace advice given to you by your health care provider. Make sure you discuss any questions you have with your health care provider. Document Revised: 05/01/2018 Document Reviewed: 05/01/2018 Elsevier Patient Education  2020 [employer].  [facility] LAPAROSCOPIC SURGERY: POST OP INSTRUCTIONS Always review your discharge instruction sheet given to you by the facility where your surgery was performed. IF YOU HAVE DISABILITY OR FAMILY LEAVE FORMS, YOU MUST BRING THEM TO THE OFFICE FOR PROCESSING.   DO NOT GIVE THEM TO YOUR DOCTOR.  PAIN CONTROL  1. First take acetaminophen (Tylenol) to control your pain after surgery.  Follow directions on package.  Taking acetaminophen (Tylenol) regularly after surgery will help to control your pain and lower the amount of prescription pain medication you may need.  You should not take more than 3,000 mg (3 grams) of acetaminophen (Tylenol) in 24 hours.  You should not take ibuprofen (Advil), aleve, motrin, naprosyn or other NSAIDS if you have a history of stomach ulcers or chronic kidney disease.  2. A prescription for pain medication may be given to you upon discharge.  Take your pain medication as prescribed, if you still have uncontrolled pain after taking acetaminophen (Tylenol). 3. Use ice packs to help control pain. 4. If you need a refill on your pain medication, please contact your  pharmacy.  They will contact our office to request authorization. Prescriptions will not be filled after 5pm or on week-ends.  HOME MEDICATIONS 5. Take your usually prescribed medications unless otherwise directed.  DIET 6. You should follow a light diet the first few days after arrival home.  Be sure to include lots of fluids daily. Avoid fatty, fried foods.   CONSTIPATION 7. It is common to experience some constipation after surgery and if you are taking pain medication.  Increasing fluid intake and taking a stool softener (such as Colace) will usually help or prevent this problem from occurring.  A mild laxative (Milk of Magnesia or Miralax) should be taken according to package instructions if there are no bowel movements after 48 hours.  WOUND/INCISION CARE 8. Most patients will experience some swelling and bruising in the area of the incisions.  Ice packs will help.  Swelling and bruising can take several days to resolve.  9. Unless discharge instructions indicate otherwise, follow guidelines below  a. STERI-STRIPS - you may remove your outer bandages 48 hours after surgery, and you may shower at that time.  You have steri-strips (small skin tapes) in place directly over the incision.  These strips should be left on the skin for 7-10 days.  b. DERMABOND/SKIN GLUE - you may shower in 24 hours.  The glue will flake off over the next 2-3 weeks. 10. Any sutures or staples will be removed at the office during your follow-up visit.  ACTIVITIES 11. You may resume regular (light) daily activities beginning the next day--such as daily self-care, walking, climbing stairs--gradually increasing activities as tolerated.  You may have sexual intercourse when it is comfortable.  Refrain from any heavy lifting or straining until approved by your doctor. a. You may drive when you are no longer taking prescription pain medication, you can comfortably wear a seatbelt, and you can safely maneuver your car and  apply brakes.  FOLLOW-UP 12. You should see your doctor in the office for a follow-up appointment approximately 2-3 weeks after your surgery.  You should have been given your post-op/follow-up appointment when your surgery was scheduled.  If you did not receive a post-op/follow-up appointment, make sure that you call for this appointment within a day or two after you arrive home to insure a convenient appointment time.   WHEN TO CALL YOUR DOCTOR: 1. Fever over 101.0 2. Inability to urinate 3. Continued bleeding from incision. 4. Increased pain, redness, or drainage from the incision. 5. Increasing abdominal pain  The clinic staff is available to answer your questions during regular business hours.  Please dont hesitate to call and ask to speak to one of the nurses for clinical concerns.  If you have a medical emergency, go to the nearest emergency room or call 911.  A surgeon from [facility] is always on call at the hospital. [address], [place], [place], [place]  [address] ? [address] [place], [place], [place]   [address] [phone] ? [phone] ? FAX [phone] Web site: [email or web address]

## 2020-11-18 NOTE — TOC Progression Note (Signed)
Transition of Care [facility]) - Progression Note    Patient Details  Name: Kimberly Stanley MRN: [id number] Date of Birth: [birth year]  Transition of Care [facility]) CM/SW Contact  Leeroy Cha, RN Phone Number: 11/18/2020, 9:21 AM  Clinical Narrative:    7 Days Post-Op   Subjective/Chief Complaint: Continuing to feel better Tolerated po    Objective: Vital signs in last 24 hours: Temp:  [97.5 F (36.4 C)-98.5 F (36.9 C)] 98 F (36.7 C) (01/12 0800) Pulse Rate:  [60-84] 60 (01/12 0900) Resp:  [15-35] 16 (01/12 0900) BP: (122-174)/(64-120) 144/66 (01/12 0900) SpO2:  [96 %-99 %] 98 % (01/12 0900) Last BM Date: 11/18/20  Intake/Output from previous day: 01/11 0701 - 01/12 0700 In: 1292.7 [P.O.:880; I.V.:212.7; IV Piggyback:200] Out: 915 [Urine:850; Drains:65] Intake/Output this shift: Total I/O In: 2340.9 [I.V.:1942.3; IV Piggyback:398.7] Out: -   Exam: Awake and alert Comfortable Abdomen soft, drain serosang Assessment/Plan: s/p Procedure(s): LAPAROSCOPY AND DRAINAGE OF BOWEL PERFORATION WITH OMENTAL GRAHAM PATCH FOR ULCER (N/A) LAPAROSCOPIC LYSIS OF ADHESIONS   No evidence of leak on UGI Advance diet to regular diet Waiting for a floor bed Potential discharge in next 1 to 2 days  LOS: 7 days  [place] AS ABOVE  Expected Discharge Plan: Home/Self Care Barriers to Discharge: Continued Medical Work up  Expected Discharge Plan and Services Expected Discharge Plan: Home/Self Care   Discharge Planning Services: CM Consult   Living arrangements for the past 2 months: Single Family Home                                       Social Determinants of Health (SDOH) Interventions    Readmission Risk Interventions No flowsheet data found.

## 2020-11-19 MED ORDER — PANTOPRAZOLE SODIUM 40 MG PO TBEC: 40.0000 mg | DELAYED_RELEASE_TABLET | Freq: Two times a day (BID) | ORAL | 1 refills | Status: AC

## 2020-11-19 MED ORDER — OMEPRAZOLE 40 MG PO CPDR: 40.0000 mg | DELAYED_RELEASE_CAPSULE | Freq: Two times a day (BID) | ORAL | 0 refills | Status: DC

## 2020-11-19 MED ORDER — OXYCODONE HCL 5 MG PO TABS: 5.0000 mg | ORAL_TABLET | Freq: Four times a day (QID) | ORAL | 0 refills | Status: AC | PRN

## 2020-11-19 NOTE — TOC Transition Note (Signed)
Transition of Care [facility]) - CM/SW Discharge Note   Patient Details  Name: Madalaine Portier MRN: [id number] Date of Birth: [birth year]  [facility]) CM/SW Contact:  Lennart Pall, LCSW Phone Number: 11/19/2020, 1:53 PM   Clinical Narrative:     Spoke with pt about PT recommendation for HHPT - she is agreeable.  Referral placed with [facility]  No further TOC needs.  Final next level of care: [place] Barriers to Discharge: Barriers Resolved   Patient Goals and CMS Choice Patient states their goals for this hospitalization and ongoing recovery are:: TO GO HOME CMS Medicare.gov Compare Post Acute Care list provided to:: Patient Choice offered to / list presented to : Patient  Discharge Placement                       Discharge Plan and Services   Discharge Planning Services: CM Consult            DME Arranged: N/A DME Agency: NA       HH Arranged: PT HH Agency: [place] Date [facility] Agency Contacted: 11/19/20 Time [place]: [phone] Representative spoke with at [place]: [place]) Interventions     Readmission Risk Interventions No flowsheet data found.

## 2020-11-19 NOTE — Discharge Summary (Signed)
[facility] Discharge Summary   Patient ID: Kimberly Stanley MRN: [id number] DOB/AGE: 11/14/1965 55 y.o.  Admit date: 11/11/2020 Discharge date: 11/19/2020  Admitting Diagnosis: Perforated gastric ulcer   Discharge Diagnosis Patient Active Problem List   Diagnosis Date Noted  . Panic attacks 11/15/2020  . Hypokalemia 11/14/2020  . Hypophosphatemia 11/14/2020  . Pneumonia due to COVID-19 virus 11/14/2020  . Acute respiratory failure with hypoxia ([place]) 11/14/2020  . Alcohol abuse, uncomplicated 62/26/3335  . Bipolar 2 disorder ([place]) 11/11/2020  . Eating disorder 11/11/2020  . Edema 11/11/2020  . Esophageal dysphagia 11/11/2020  . Gastroesophageal reflux disease 11/11/2020  . Genital herpes simplex 11/11/2020  . Iron deficiency anemia 11/11/2020  . Vitamin D deficiency 11/11/2020  . Personal history of other specified conditions 11/11/2020  . Peptic ulcer disease 11/11/2020  . 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 Monoallelic mutation of CHEK2 gene in female patient 09/19/2018  . Genetic testing 08/23/2018  . Family history of breast cancer   . Family history of pancreatic cancer   . Cutaneous leiomyoma   . Intra-abdominal free air of unknown etiology 09/23/2015  . 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 Preseptal cellulitis 12/20/2011  . Leukocytosis 12/20/2011  . HTN (hypertension) 12/20/2011  . Anxiety and depression 12/20/2011  . Hypothyroidism 12/20/2011  . Abscess of forehead 12/20/2011    Consultants Internal medicine  Imaging: No results found.  Procedures Dr. Michael Boston (11/11/20) - Laparoscopic graham patch repair of gastric ulcer, LOA  Hospital Course:  Patient is a 55 year old female with hx of gastric bypass and prior perforated gastric ulcer in 2016 who presented to [facility]  with severe abdominal pain.  Workup showed pneumoperitoneum with likely recurrent gastric perforation.  Patient was admitted and underwent procedure listed above.  Tolerated procedure well and was transferred to the floor. UGI obtained on POD#6 and was negative for leak. Diet was advanced as tolerated.  On POD#8, the patient was voiding well, tolerating diet, ambulating well, pain well controlled, vital signs stable, incisions c/d/i and felt stable for discharge home. Drain removed prior to discharge.  Patient will follow up in our office in 2-3 weeks and knows to call with questions or concerns.  She will call to confirm appointment date/time.    Hospitalization complicated by severe anxiety which improved/stabilized once able to be transitioned back to home regimen. Also complicated by [id number] PNA, patient stable from a respiratory standpoint prior to discharge and understands quarantine restrictions.   Physical Exam: General:  Alert, NAD, pleasant, comfortable Abd:  Soft, ND, mild tenderness, incisions C/D/I, drain with minimal SS fluid (removed)  I or a member of my team have reviewed this patient in the Controlled Substance Database.   Allergies as of 11/19/2020      Reactions   Nsaids Other (See Comments)   Perforated ulcer.  S/p gastric bypass   Ativan [lorazepam] Other (See Comments)   Hallucinations   Haldol [haloperidol Lactate] Other (See Comments)   Hallucinations      Medication List    STOP taking these medications   pantoprazole 40 MG tablet Commonly known as: PROTONIX Replaced by: omeprazole 40 MG capsule     TAKE these medications   acetaminophen 500 MG tablet Commonly known as: TYLENOL Take 1,000 mg by mouth every 6 (six) hours as needed for mild pain.  ALPRAZolam 1 MG tablet Commonly known as: XANAX Take 1 mg by mouth 4 (four) times daily as needed for anxiety.   amphetamine-dextroamphetamine 20 MG tablet Commonly known as: ADDERALL Take 20 mg by mouth 4  (four) times daily as needed (focus).   busPIRone 30 MG tablet Commonly known as: BUSPAR Take 30 mg by mouth 2 (two) times daily as needed (anxiety).   FLINTSTONES COMPLETE PO Take 1 tablet by mouth daily.   FLUoxetine HCl 60 MG Tabs Take 60 mg by mouth daily.   furosemide 40 MG tablet Commonly known as: LASIX Take 40 mg by mouth 2 (two) times daily.   gabapentin 600 MG tablet Commonly known as: NEURONTIN Take 300-600 mg by mouth 3 (three) times daily.   lamoTRIgine 150 MG tablet Commonly known as: LAMICTAL Take 300 mg by mouth daily.   Levothyroxine Sodium 112 MCG Caps Commonly known as: Tirosint Take 1 tablet daily in am What changed:   how much to take  how to take this  when to take this  additional instructions   omeprazole 40 MG capsule Commonly known as: PRILOSEC Take 1 capsule (40 mg total) by mouth 2 (two) times daily. Replaces: pantoprazole 40 MG tablet   oxyCODONE 5 MG immediate release tablet Commonly known as: Oxy IR/ROXICODONE Take 1 tablet (5 mg total) by mouth every 6 (six) hours as needed for moderate pain or severe pain.   traZODone 50 MG tablet Commonly known as: DESYREL Take 150-300 mg by mouth at bedtime.         Follow-up Information    Michael Boston, MD Follow up on 12/07/2020.   Specialty: General Surgery Why: Arrive at 3:30 for 3:45 appointment. Please bring photo ID and insurance information with you.  Contact information: [place] [address] [phone]               Signed: Norm Parcel , [facility] 11/19/2020, 11:40 AM Please see Amion for pager number during day hours 7:00am-4:30pm

## 2020-12-01 DIAGNOSIS — F331 Major depressive disorder, recurrent, moderate: Secondary | ICD-10-CM | POA: Diagnosis not present

## 2020-12-01 DIAGNOSIS — F902 Attention-deficit hyperactivity disorder, combined type: Secondary | ICD-10-CM | POA: Diagnosis not present

## 2020-12-10 DIAGNOSIS — F331 Major depressive disorder, recurrent, moderate: Secondary | ICD-10-CM | POA: Diagnosis not present

## 2020-12-10 DIAGNOSIS — F902 Attention-deficit hyperactivity disorder, combined type: Secondary | ICD-10-CM | POA: Diagnosis not present

## 2020-12-11 DIAGNOSIS — F3342 Major depressive disorder, recurrent, in full remission: Secondary | ICD-10-CM | POA: Diagnosis not present

## 2020-12-11 DIAGNOSIS — F410 Panic disorder [episodic paroxysmal anxiety] without agoraphobia: Secondary | ICD-10-CM | POA: Diagnosis not present

## 2020-12-24 DIAGNOSIS — F902 Attention-deficit hyperactivity disorder, combined type: Secondary | ICD-10-CM | POA: Diagnosis not present

## 2020-12-24 DIAGNOSIS — F331 Major depressive disorder, recurrent, moderate: Secondary | ICD-10-CM | POA: Diagnosis not present

## 2021-01-15 ENCOUNTER — Other Ambulatory Visit: Payer: Self-pay | Admitting: Family Medicine

## 2021-01-15 DIAGNOSIS — Z1231 Encounter for screening mammogram for malignant neoplasm of breast: Secondary | ICD-10-CM

## 2021-01-21 DIAGNOSIS — F331 Major depressive disorder, recurrent, moderate: Secondary | ICD-10-CM | POA: Diagnosis not present

## 2021-01-21 DIAGNOSIS — F902 Attention-deficit hyperactivity disorder, combined type: Secondary | ICD-10-CM | POA: Diagnosis not present

## 2021-02-04 DIAGNOSIS — F902 Attention-deficit hyperactivity disorder, combined type: Secondary | ICD-10-CM | POA: Diagnosis not present

## 2021-02-04 DIAGNOSIS — F331 Major depressive disorder, recurrent, moderate: Secondary | ICD-10-CM | POA: Diagnosis not present

## 2021-02-09 DIAGNOSIS — E039 Hypothyroidism, unspecified: Secondary | ICD-10-CM | POA: Diagnosis not present

## 2021-02-09 DIAGNOSIS — N951 Menopausal and female climacteric states: Secondary | ICD-10-CM | POA: Diagnosis not present

## 2021-02-16 DIAGNOSIS — N951 Menopausal and female climacteric states: Secondary | ICD-10-CM | POA: Diagnosis not present

## 2021-02-17 DIAGNOSIS — F331 Major depressive disorder, recurrent, moderate: Secondary | ICD-10-CM | POA: Diagnosis not present

## 2021-02-17 DIAGNOSIS — F902 Attention-deficit hyperactivity disorder, combined type: Secondary | ICD-10-CM | POA: Diagnosis not present

## 2021-03-10 ENCOUNTER — Other Ambulatory Visit: Payer: Self-pay

## 2021-03-10 ENCOUNTER — Ambulatory Visit
Admission: RE | Admit: 2021-03-10 | Discharge: 2021-03-10 | Disposition: A | Discharge status: Home / Self Care | Payer: BC Managed Care – PPO | Source: Ambulatory Visit | Attending: Family Medicine | Admitting: Family Medicine

## 2021-03-10 DIAGNOSIS — Z1231 Encounter for screening mammogram for malignant neoplasm of breast: Secondary | ICD-10-CM

## 2021-03-11 DIAGNOSIS — F331 Major depressive disorder, recurrent, moderate: Secondary | ICD-10-CM | POA: Diagnosis not present

## 2021-03-11 DIAGNOSIS — F902 Attention-deficit hyperactivity disorder, combined type: Secondary | ICD-10-CM | POA: Diagnosis not present

## 2021-03-12 ENCOUNTER — Other Ambulatory Visit: Payer: Self-pay | Admitting: Family Medicine

## 2021-03-12 DIAGNOSIS — R928 Other abnormal and inconclusive findings on diagnostic imaging of breast: Secondary | ICD-10-CM

## 2021-04-02 ENCOUNTER — Ambulatory Visit
Admission: RE | Admit: 2021-04-02 | Discharge: 2021-04-02 | Disposition: A | Discharge status: Home / Self Care | Payer: BC Managed Care – PPO | Source: Ambulatory Visit | Attending: Family Medicine | Admitting: Family Medicine

## 2021-04-02 ENCOUNTER — Other Ambulatory Visit: Payer: Self-pay

## 2021-04-02 DIAGNOSIS — N6489 Other specified disorders of breast: Secondary | ICD-10-CM | POA: Diagnosis not present

## 2021-04-02 DIAGNOSIS — R922 Inconclusive mammogram: Secondary | ICD-10-CM | POA: Diagnosis not present

## 2021-04-02 DIAGNOSIS — R928 Other abnormal and inconclusive findings on diagnostic imaging of breast: Secondary | ICD-10-CM

## 2021-04-29 DIAGNOSIS — F902 Attention-deficit hyperactivity disorder, combined type: Secondary | ICD-10-CM | POA: Diagnosis not present

## 2021-04-29 DIAGNOSIS — F331 Major depressive disorder, recurrent, moderate: Secondary | ICD-10-CM | POA: Diagnosis not present

## 2021-05-25 DIAGNOSIS — F331 Major depressive disorder, recurrent, moderate: Secondary | ICD-10-CM | POA: Diagnosis not present

## 2021-05-25 DIAGNOSIS — E039 Hypothyroidism, unspecified: Secondary | ICD-10-CM | POA: Diagnosis not present

## 2021-05-25 DIAGNOSIS — F902 Attention-deficit hyperactivity disorder, combined type: Secondary | ICD-10-CM | POA: Diagnosis not present

## 2021-05-25 DIAGNOSIS — E559 Vitamin D deficiency, unspecified: Secondary | ICD-10-CM | POA: Diagnosis not present

## 2021-05-25 DIAGNOSIS — Z9884 Bariatric surgery status: Secondary | ICD-10-CM | POA: Diagnosis not present

## 2021-05-25 DIAGNOSIS — M25511 Pain in right shoulder: Secondary | ICD-10-CM | POA: Diagnosis not present

## 2021-05-25 DIAGNOSIS — Z23 Encounter for immunization: Secondary | ICD-10-CM | POA: Diagnosis not present

## 2021-05-25 DIAGNOSIS — Z0000 Encounter for general adult medical examination without abnormal findings: Secondary | ICD-10-CM | POA: Diagnosis not present

## 2021-05-31 DIAGNOSIS — M25511 Pain in right shoulder: Secondary | ICD-10-CM | POA: Diagnosis not present

## 2021-06-08 DIAGNOSIS — F3342 Major depressive disorder, recurrent, in full remission: Secondary | ICD-10-CM | POA: Diagnosis not present

## 2021-06-08 DIAGNOSIS — F900 Attention-deficit hyperactivity disorder, predominantly inattentive type: Secondary | ICD-10-CM | POA: Diagnosis not present

## 2021-06-17 DIAGNOSIS — F331 Major depressive disorder, recurrent, moderate: Secondary | ICD-10-CM | POA: Diagnosis not present

## 2021-06-17 DIAGNOSIS — Z9884 Bariatric surgery status: Secondary | ICD-10-CM | POA: Diagnosis not present

## 2021-06-17 DIAGNOSIS — F902 Attention-deficit hyperactivity disorder, combined type: Secondary | ICD-10-CM | POA: Diagnosis not present

## 2021-06-22 DIAGNOSIS — M25511 Pain in right shoulder: Secondary | ICD-10-CM | POA: Diagnosis not present

## 2021-06-28 DIAGNOSIS — M25511 Pain in right shoulder: Secondary | ICD-10-CM | POA: Diagnosis not present

## 2021-07-01 DIAGNOSIS — F902 Attention-deficit hyperactivity disorder, combined type: Secondary | ICD-10-CM | POA: Diagnosis not present

## 2021-07-01 DIAGNOSIS — F331 Major depressive disorder, recurrent, moderate: Secondary | ICD-10-CM | POA: Diagnosis not present

## 2021-07-05 DIAGNOSIS — M25511 Pain in right shoulder: Secondary | ICD-10-CM | POA: Diagnosis not present

## 2021-07-13 DIAGNOSIS — M25511 Pain in right shoulder: Secondary | ICD-10-CM | POA: Diagnosis not present

## 2021-07-16 ENCOUNTER — Ambulatory Visit
Admission: RE | Admit: 2021-07-16 | Discharge: 2021-07-16 | Disposition: A | Discharge status: Home / Self Care | Payer: BC Managed Care – PPO | Source: Ambulatory Visit | Attending: Sports Medicine | Admitting: Sports Medicine

## 2021-07-16 ENCOUNTER — Other Ambulatory Visit: Payer: Self-pay | Admitting: Sports Medicine

## 2021-07-16 DIAGNOSIS — M25511 Pain in right shoulder: Secondary | ICD-10-CM

## 2021-07-19 ENCOUNTER — Encounter: Payer: Self-pay | Admitting: Licensed Clinical Social Worker

## 2021-07-19 ENCOUNTER — Other Ambulatory Visit: Payer: Self-pay | Admitting: Family Medicine

## 2021-07-19 DIAGNOSIS — Z1509 Genetic susceptibility to other malignant neoplasm: Secondary | ICD-10-CM

## 2021-07-19 DIAGNOSIS — Z9189 Other specified personal risk factors, not elsewhere classified: Secondary | ICD-10-CM

## 2021-07-19 DIAGNOSIS — M25511 Pain in right shoulder: Secondary | ICD-10-CM | POA: Diagnosis not present

## 2021-07-19 NOTE — Progress Notes (Signed)
UPDATE: VUS in NF1 called [id number] has been reclassified to "Likely Benign." The report date is 07/15/2021.

## 2021-07-21 DIAGNOSIS — M25511 Pain in right shoulder: Secondary | ICD-10-CM | POA: Diagnosis not present

## 2021-09-03 DIAGNOSIS — F902 Attention-deficit hyperactivity disorder, combined type: Secondary | ICD-10-CM | POA: Diagnosis not present

## 2021-09-03 DIAGNOSIS — F331 Major depressive disorder, recurrent, moderate: Secondary | ICD-10-CM | POA: Diagnosis not present

## 2021-09-10 ENCOUNTER — Ambulatory Visit
Admission: RE | Admit: 2021-09-10 | Discharge: 2021-09-10 | Disposition: A | Discharge status: Home / Self Care | Payer: BC Managed Care – PPO | Source: Ambulatory Visit | Attending: Family Medicine | Admitting: Family Medicine

## 2021-09-10 DIAGNOSIS — Z9189 Other specified personal risk factors, not elsewhere classified: Secondary | ICD-10-CM

## 2021-09-10 DIAGNOSIS — Z1509 Genetic susceptibility to other malignant neoplasm: Secondary | ICD-10-CM

## 2021-09-10 DIAGNOSIS — R928 Other abnormal and inconclusive findings on diagnostic imaging of breast: Secondary | ICD-10-CM | POA: Diagnosis not present

## 2021-09-10 MED ORDER — GADOBUTROL 1 MMOL/ML IV SOLN
8.0000 mL | Freq: Once | INTRAVENOUS | Status: AC | PRN
  Administered 2021-09-10: 8 mL via INTRAVENOUS

## 2021-09-13 ENCOUNTER — Other Ambulatory Visit: Payer: Self-pay | Admitting: Family Medicine

## 2021-09-13 DIAGNOSIS — R9389 Abnormal findings on diagnostic imaging of other specified body structures: Secondary | ICD-10-CM

## 2021-09-17 ENCOUNTER — Ambulatory Visit
Admission: RE | Admit: 2021-09-17 | Discharge: 2021-09-17 | Disposition: A | Discharge status: Home / Self Care | Payer: BC Managed Care – PPO | Source: Ambulatory Visit | Attending: Family Medicine | Admitting: Family Medicine

## 2021-09-17 ENCOUNTER — Other Ambulatory Visit: Payer: Self-pay | Admitting: Diagnostic Radiology

## 2021-09-17 ENCOUNTER — Other Ambulatory Visit: Payer: Self-pay

## 2021-09-17 DIAGNOSIS — N6324 Unspecified lump in the left breast, lower inner quadrant: Secondary | ICD-10-CM | POA: Diagnosis not present

## 2021-09-17 DIAGNOSIS — R9389 Abnormal findings on diagnostic imaging of other specified body structures: Secondary | ICD-10-CM

## 2021-09-17 MED ORDER — GADOBUTROL 1 MMOL/ML IV SOLN
8.0000 mL | Freq: Once | INTRAVENOUS | Status: AC | PRN
  Administered 2021-09-17: 8 mL via INTRAVENOUS

## 2021-09-24 DIAGNOSIS — F902 Attention-deficit hyperactivity disorder, combined type: Secondary | ICD-10-CM | POA: Diagnosis not present

## 2021-09-24 DIAGNOSIS — F331 Major depressive disorder, recurrent, moderate: Secondary | ICD-10-CM | POA: Diagnosis not present

## 2021-09-28 DIAGNOSIS — M25511 Pain in right shoulder: Secondary | ICD-10-CM | POA: Diagnosis not present

## 2021-10-11 ENCOUNTER — Other Ambulatory Visit: Payer: Self-pay | Admitting: Sports Medicine

## 2021-10-11 DIAGNOSIS — M25511 Pain in right shoulder: Secondary | ICD-10-CM

## 2021-10-28 DIAGNOSIS — F331 Major depressive disorder, recurrent, moderate: Secondary | ICD-10-CM | POA: Diagnosis not present

## 2021-10-28 DIAGNOSIS — F902 Attention-deficit hyperactivity disorder, combined type: Secondary | ICD-10-CM | POA: Diagnosis not present

## 2021-11-02 ENCOUNTER — Other Ambulatory Visit: Payer: Self-pay

## 2021-11-02 ENCOUNTER — Ambulatory Visit
Admission: RE | Admit: 2021-11-02 | Discharge: 2021-11-02 | Disposition: A | Discharge status: Home / Self Care | Payer: BC Managed Care – PPO | Source: Ambulatory Visit | Attending: Sports Medicine | Admitting: Sports Medicine

## 2021-11-02 DIAGNOSIS — M25511 Pain in right shoulder: Secondary | ICD-10-CM

## 2021-11-03 DIAGNOSIS — F331 Major depressive disorder, recurrent, moderate: Secondary | ICD-10-CM | POA: Diagnosis not present

## 2021-11-03 DIAGNOSIS — F902 Attention-deficit hyperactivity disorder, combined type: Secondary | ICD-10-CM | POA: Diagnosis not present

## 2021-11-04 DIAGNOSIS — F900 Attention-deficit hyperactivity disorder, predominantly inattentive type: Secondary | ICD-10-CM | POA: Diagnosis not present

## 2021-11-04 DIAGNOSIS — F410 Panic disorder [episodic paroxysmal anxiety] without agoraphobia: Secondary | ICD-10-CM | POA: Diagnosis not present

## 2021-11-04 DIAGNOSIS — F3342 Major depressive disorder, recurrent, in full remission: Secondary | ICD-10-CM | POA: Diagnosis not present

## 2021-11-12 DIAGNOSIS — F331 Major depressive disorder, recurrent, moderate: Secondary | ICD-10-CM | POA: Diagnosis not present

## 2021-11-12 DIAGNOSIS — F902 Attention-deficit hyperactivity disorder, combined type: Secondary | ICD-10-CM | POA: Diagnosis not present

## 2021-11-17 DIAGNOSIS — M25551 Pain in right hip: Secondary | ICD-10-CM | POA: Diagnosis not present

## 2021-11-25 DIAGNOSIS — F331 Major depressive disorder, recurrent, moderate: Secondary | ICD-10-CM | POA: Diagnosis not present

## 2021-11-25 DIAGNOSIS — F902 Attention-deficit hyperactivity disorder, combined type: Secondary | ICD-10-CM | POA: Diagnosis not present

## 2021-12-09 DIAGNOSIS — F331 Major depressive disorder, recurrent, moderate: Secondary | ICD-10-CM | POA: Diagnosis not present

## 2021-12-09 DIAGNOSIS — F902 Attention-deficit hyperactivity disorder, combined type: Secondary | ICD-10-CM | POA: Diagnosis not present

## 2021-12-30 DIAGNOSIS — F902 Attention-deficit hyperactivity disorder, combined type: Secondary | ICD-10-CM | POA: Diagnosis not present

## 2021-12-30 DIAGNOSIS — F331 Major depressive disorder, recurrent, moderate: Secondary | ICD-10-CM | POA: Diagnosis not present

## 2022-01-13 DIAGNOSIS — F902 Attention-deficit hyperactivity disorder, combined type: Secondary | ICD-10-CM | POA: Diagnosis not present

## 2022-01-13 DIAGNOSIS — F331 Major depressive disorder, recurrent, moderate: Secondary | ICD-10-CM | POA: Diagnosis not present

## 2022-01-27 DIAGNOSIS — F902 Attention-deficit hyperactivity disorder, combined type: Secondary | ICD-10-CM | POA: Diagnosis not present

## 2022-01-27 DIAGNOSIS — F331 Major depressive disorder, recurrent, moderate: Secondary | ICD-10-CM | POA: Diagnosis not present

## 2022-02-17 DIAGNOSIS — F902 Attention-deficit hyperactivity disorder, combined type: Secondary | ICD-10-CM | POA: Diagnosis not present

## 2022-02-17 DIAGNOSIS — F331 Major depressive disorder, recurrent, moderate: Secondary | ICD-10-CM | POA: Diagnosis not present

## 2022-02-18 ENCOUNTER — Other Ambulatory Visit: Payer: Self-pay | Admitting: Family Medicine

## 2022-02-18 DIAGNOSIS — Z9189 Other specified personal risk factors, not elsewhere classified: Secondary | ICD-10-CM

## 2022-02-18 DIAGNOSIS — Z803 Family history of malignant neoplasm of breast: Secondary | ICD-10-CM

## 2022-02-18 DIAGNOSIS — Z1231 Encounter for screening mammogram for malignant neoplasm of breast: Secondary | ICD-10-CM

## 2022-03-03 DIAGNOSIS — F331 Major depressive disorder, recurrent, moderate: Secondary | ICD-10-CM | POA: Diagnosis not present

## 2022-03-03 DIAGNOSIS — F902 Attention-deficit hyperactivity disorder, combined type: Secondary | ICD-10-CM | POA: Diagnosis not present

## 2022-03-17 ENCOUNTER — Ambulatory Visit
Admission: RE | Admit: 2022-03-17 | Discharge: 2022-03-17 | Disposition: A | Discharge status: Home / Self Care | Payer: BC Managed Care – PPO | Source: Ambulatory Visit | Attending: Family Medicine | Admitting: Family Medicine

## 2022-03-17 DIAGNOSIS — Z1231 Encounter for screening mammogram for malignant neoplasm of breast: Secondary | ICD-10-CM | POA: Diagnosis not present

## 2022-03-17 DIAGNOSIS — F902 Attention-deficit hyperactivity disorder, combined type: Secondary | ICD-10-CM | POA: Diagnosis not present

## 2022-03-17 DIAGNOSIS — F331 Major depressive disorder, recurrent, moderate: Secondary | ICD-10-CM | POA: Diagnosis not present

## 2022-03-21 ENCOUNTER — Other Ambulatory Visit: Payer: BC Managed Care – PPO

## 2022-04-01 ENCOUNTER — Ambulatory Visit
Admission: RE | Admit: 2022-04-01 | Discharge: 2022-04-01 | Disposition: A | Discharge status: Home / Self Care | Payer: BC Managed Care – PPO | Source: Ambulatory Visit | Attending: Family Medicine | Admitting: Family Medicine

## 2022-04-01 DIAGNOSIS — Z9189 Other specified personal risk factors, not elsewhere classified: Secondary | ICD-10-CM

## 2022-04-01 DIAGNOSIS — Z803 Family history of malignant neoplasm of breast: Secondary | ICD-10-CM | POA: Diagnosis not present

## 2022-04-01 DIAGNOSIS — Z1239 Encounter for other screening for malignant neoplasm of breast: Secondary | ICD-10-CM | POA: Diagnosis not present

## 2022-04-01 MED ORDER — GADOBUTROL 1 MMOL/ML IV SOLN
8.0000 mL | Freq: Once | INTRAVENOUS | Status: AC | PRN
  Administered 2022-04-01: 8 mL via INTRAVENOUS

## 2022-04-07 DIAGNOSIS — F331 Major depressive disorder, recurrent, moderate: Secondary | ICD-10-CM | POA: Diagnosis not present

## 2022-04-07 DIAGNOSIS — F902 Attention-deficit hyperactivity disorder, combined type: Secondary | ICD-10-CM | POA: Diagnosis not present

## 2022-04-28 DIAGNOSIS — F902 Attention-deficit hyperactivity disorder, combined type: Secondary | ICD-10-CM | POA: Diagnosis not present

## 2022-04-28 DIAGNOSIS — F331 Major depressive disorder, recurrent, moderate: Secondary | ICD-10-CM | POA: Diagnosis not present

## 2022-05-02 DIAGNOSIS — F4310 Post-traumatic stress disorder, unspecified: Secondary | ICD-10-CM | POA: Diagnosis not present

## 2022-05-02 DIAGNOSIS — F3342 Major depressive disorder, recurrent, in full remission: Secondary | ICD-10-CM | POA: Diagnosis not present

## 2022-05-02 DIAGNOSIS — F410 Panic disorder [episodic paroxysmal anxiety] without agoraphobia: Secondary | ICD-10-CM | POA: Diagnosis not present

## 2022-05-02 DIAGNOSIS — F900 Attention-deficit hyperactivity disorder, predominantly inattentive type: Secondary | ICD-10-CM | POA: Diagnosis not present

## 2022-05-19 DIAGNOSIS — F902 Attention-deficit hyperactivity disorder, combined type: Secondary | ICD-10-CM | POA: Diagnosis not present

## 2022-05-19 DIAGNOSIS — F331 Major depressive disorder, recurrent, moderate: Secondary | ICD-10-CM | POA: Diagnosis not present

## 2022-06-02 DIAGNOSIS — F324 Major depressive disorder, single episode, in partial remission: Secondary | ICD-10-CM | POA: Diagnosis not present

## 2022-06-02 DIAGNOSIS — Z124 Encounter for screening for malignant neoplasm of cervix: Secondary | ICD-10-CM | POA: Diagnosis not present

## 2022-06-02 DIAGNOSIS — K279 Peptic ulcer, site unspecified, unspecified as acute or chronic, without hemorrhage or perforation: Secondary | ICD-10-CM | POA: Diagnosis not present

## 2022-06-02 DIAGNOSIS — F419 Anxiety disorder, unspecified: Secondary | ICD-10-CM | POA: Diagnosis not present

## 2022-06-02 DIAGNOSIS — R600 Localized edema: Secondary | ICD-10-CM | POA: Diagnosis not present

## 2022-06-02 DIAGNOSIS — Z13220 Encounter for screening for lipoid disorders: Secondary | ICD-10-CM | POA: Diagnosis not present

## 2022-06-02 DIAGNOSIS — E039 Hypothyroidism, unspecified: Secondary | ICD-10-CM | POA: Diagnosis not present

## 2022-06-02 DIAGNOSIS — R609 Edema, unspecified: Secondary | ICD-10-CM | POA: Diagnosis not present

## 2022-06-02 DIAGNOSIS — N841 Polyp of cervix uteri: Secondary | ICD-10-CM | POA: Diagnosis not present

## 2022-06-02 DIAGNOSIS — Z0000 Encounter for general adult medical examination without abnormal findings: Secondary | ICD-10-CM | POA: Diagnosis not present

## 2022-06-02 DIAGNOSIS — Z9884 Bariatric surgery status: Secondary | ICD-10-CM | POA: Diagnosis not present

## 2022-06-03 ENCOUNTER — Other Ambulatory Visit: Payer: Self-pay | Admitting: Family Medicine

## 2022-06-03 DIAGNOSIS — Z1239 Encounter for other screening for malignant neoplasm of breast: Secondary | ICD-10-CM

## 2022-06-09 DIAGNOSIS — F902 Attention-deficit hyperactivity disorder, combined type: Secondary | ICD-10-CM | POA: Diagnosis not present

## 2022-06-09 DIAGNOSIS — F331 Major depressive disorder, recurrent, moderate: Secondary | ICD-10-CM | POA: Diagnosis not present

## 2022-06-30 DIAGNOSIS — F331 Major depressive disorder, recurrent, moderate: Secondary | ICD-10-CM | POA: Diagnosis not present

## 2022-06-30 DIAGNOSIS — F902 Attention-deficit hyperactivity disorder, combined type: Secondary | ICD-10-CM | POA: Diagnosis not present

## 2022-08-19 DIAGNOSIS — F331 Major depressive disorder, recurrent, moderate: Secondary | ICD-10-CM | POA: Diagnosis not present

## 2022-08-19 DIAGNOSIS — F902 Attention-deficit hyperactivity disorder, combined type: Secondary | ICD-10-CM | POA: Diagnosis not present

## 2022-09-13 DIAGNOSIS — F902 Attention-deficit hyperactivity disorder, combined type: Secondary | ICD-10-CM | POA: Diagnosis not present

## 2022-09-13 DIAGNOSIS — F331 Major depressive disorder, recurrent, moderate: Secondary | ICD-10-CM | POA: Diagnosis not present

## 2022-09-20 ENCOUNTER — Telehealth: Payer: Self-pay

## 2022-09-20 NOTE — Patient Outreach (Signed)
  Care Coordination   09/20/2022 Name: Kimberly Stanley MRN: [id number] DOB: 11-01-1966   Care Coordination Outreach Attempts:  An unsuccessful telephone outreach was attempted today to offer the patient information about available care coordination services as a benefit of their health plan.   Follow Up Plan:  Additional outreach attempts will be made to offer the patient care coordination information and services.   Encounter Outcome:  No Answer  Care Coordination Interventions Activated:  No   Care Coordination Interventions:  No, not indicated    Jone Baseman, RN, MSN [facility] Care Management Care Management Coordinator Direct Line [phone]

## 2022-09-28 ENCOUNTER — Encounter: Payer: Self-pay | Admitting: Family Medicine

## 2022-10-03 ENCOUNTER — Other Ambulatory Visit: Payer: BC Managed Care – PPO

## 2022-10-13 ENCOUNTER — Telehealth: Payer: Self-pay

## 2022-10-13 NOTE — Patient Instructions (Signed)
Visit Information  Thank you for taking time to visit with me today. Please don't hesitate to contact me if I can be of assistance to you.   Following are the goals we discussed today:   Goals Addressed             This Visit's Progress    COMPLETED: Care Coordination Activities-No follow up required       Care Coordination Interventions: Advised patient to schedule annual exam and vaccines.  Discussed [facility] services and support.  Patient decline.            If you are experiencing a Mental Health or [place] or need someone to talk to, please call the Suicide and Crisis Lifeline: 988   Patient verbalizes understanding of instructions and care plan provided today and agrees to view in [place]. Active MyChart status and patient understanding of how to access instructions and care plan via MyChart confirmed with patient.     No further follow up required: decline  Jone Baseman, RN, MSN [place] Management Care Management Coordinator Direct Line [phone]

## 2022-10-13 NOTE — Patient Outreach (Signed)
  Care Coordination   Initial Visit Note   10/13/2022 Name: CAMMIE FAULSTICH MRN: [id number] DOB: November 16, 1965  SANJNA HASKEW is a 56 y.o. year old female who sees Harlan Stains, MD for primary care. I spoke with  Aron Baba by phone today.  What matters to the patients health and wellness today?  none    Goals Addressed             This Visit's Progress    COMPLETED: Care Coordination Activities-No follow up required       Care Coordination Interventions: Advised patient to schedule annual exam and vaccines.  Discussed [facility] services and support.  Patient decline.          SDOH assessments and interventions completed:  Yes  SDOH Interventions Today    Flowsheet Row Most Recent Value  SDOH Interventions   Housing Interventions Intervention Not Indicated  Transportation Interventions Intervention Not Indicated        Care Coordination Interventions:  Yes, provided   Follow up plan: No further intervention required.   Encounter Outcome:  Pt. Visit Completed   Jone Baseman, RN, MSN [place] Management Care Management Coordinator Direct Line [phone]

## 2022-10-14 DIAGNOSIS — F331 Major depressive disorder, recurrent, moderate: Secondary | ICD-10-CM | POA: Diagnosis not present

## 2022-10-14 DIAGNOSIS — F902 Attention-deficit hyperactivity disorder, combined type: Secondary | ICD-10-CM | POA: Diagnosis not present

## 2022-10-19 ENCOUNTER — Other Ambulatory Visit: Payer: Self-pay | Admitting: Podiatry

## 2022-10-19 ENCOUNTER — Other Ambulatory Visit: Payer: Self-pay | Admitting: Family Medicine

## 2022-10-24 DIAGNOSIS — F410 Panic disorder [episodic paroxysmal anxiety] without agoraphobia: Secondary | ICD-10-CM | POA: Diagnosis not present

## 2022-10-24 DIAGNOSIS — F900 Attention-deficit hyperactivity disorder, predominantly inattentive type: Secondary | ICD-10-CM | POA: Diagnosis not present

## 2022-10-24 DIAGNOSIS — F3342 Major depressive disorder, recurrent, in full remission: Secondary | ICD-10-CM | POA: Diagnosis not present

## 2022-11-12 DIAGNOSIS — F902 Attention-deficit hyperactivity disorder, combined type: Secondary | ICD-10-CM | POA: Diagnosis not present

## 2022-11-12 DIAGNOSIS — F331 Major depressive disorder, recurrent, moderate: Secondary | ICD-10-CM | POA: Diagnosis not present

## 2022-12-01 DIAGNOSIS — F902 Attention-deficit hyperactivity disorder, combined type: Secondary | ICD-10-CM | POA: Diagnosis not present

## 2022-12-01 DIAGNOSIS — F331 Major depressive disorder, recurrent, moderate: Secondary | ICD-10-CM | POA: Diagnosis not present

## 2022-12-23 DIAGNOSIS — F331 Major depressive disorder, recurrent, moderate: Secondary | ICD-10-CM | POA: Diagnosis not present

## 2022-12-23 DIAGNOSIS — F902 Attention-deficit hyperactivity disorder, combined type: Secondary | ICD-10-CM | POA: Diagnosis not present

## 2023-01-05 DIAGNOSIS — F902 Attention-deficit hyperactivity disorder, combined type: Secondary | ICD-10-CM | POA: Diagnosis not present

## 2023-01-05 DIAGNOSIS — F331 Major depressive disorder, recurrent, moderate: Secondary | ICD-10-CM | POA: Diagnosis not present

## 2023-02-08 ENCOUNTER — Other Ambulatory Visit: Payer: Self-pay | Admitting: Family Medicine

## 2023-02-08 DIAGNOSIS — Z1501 Genetic susceptibility to malignant neoplasm of breast: Secondary | ICD-10-CM

## 2023-02-08 DIAGNOSIS — Z1231 Encounter for screening mammogram for malignant neoplasm of breast: Secondary | ICD-10-CM

## 2023-02-10 DIAGNOSIS — F331 Major depressive disorder, recurrent, moderate: Secondary | ICD-10-CM | POA: Diagnosis not present

## 2023-02-10 DIAGNOSIS — F902 Attention-deficit hyperactivity disorder, combined type: Secondary | ICD-10-CM | POA: Diagnosis not present

## 2023-03-16 DIAGNOSIS — F331 Major depressive disorder, recurrent, moderate: Secondary | ICD-10-CM | POA: Diagnosis not present

## 2023-03-16 DIAGNOSIS — F902 Attention-deficit hyperactivity disorder, combined type: Secondary | ICD-10-CM | POA: Diagnosis not present

## 2023-03-23 ENCOUNTER — Ambulatory Visit
Admission: RE | Admit: 2023-03-23 | Discharge: 2023-03-23 | Disposition: A | Discharge status: Home / Self Care | Payer: BC Managed Care – PPO | Source: Ambulatory Visit | Attending: Family Medicine | Admitting: Family Medicine

## 2023-03-23 DIAGNOSIS — Z1231 Encounter for screening mammogram for malignant neoplasm of breast: Secondary | ICD-10-CM

## 2023-04-14 ENCOUNTER — Encounter: Payer: Self-pay | Admitting: Licensed Clinical Social Worker

## 2023-04-14 DIAGNOSIS — F900 Attention-deficit hyperactivity disorder, predominantly inattentive type: Secondary | ICD-10-CM | POA: Diagnosis not present

## 2023-04-14 DIAGNOSIS — F410 Panic disorder [episodic paroxysmal anxiety] without agoraphobia: Secondary | ICD-10-CM | POA: Diagnosis not present

## 2023-04-14 DIAGNOSIS — F3342 Major depressive disorder, recurrent, in full remission: Secondary | ICD-10-CM | POA: Diagnosis not present

## 2023-04-14 NOTE — Progress Notes (Signed)
UPDATE: VUS in TSC1 called [id number] ([id number]) has been reclassified to "Likely Benign." The report date is 04/05/2023.

## 2023-04-20 DIAGNOSIS — F331 Major depressive disorder, recurrent, moderate: Secondary | ICD-10-CM | POA: Diagnosis not present

## 2023-04-20 DIAGNOSIS — F902 Attention-deficit hyperactivity disorder, combined type: Secondary | ICD-10-CM | POA: Diagnosis not present

## 2023-06-01 DIAGNOSIS — F902 Attention-deficit hyperactivity disorder, combined type: Secondary | ICD-10-CM | POA: Diagnosis not present

## 2023-06-01 DIAGNOSIS — F331 Major depressive disorder, recurrent, moderate: Secondary | ICD-10-CM | POA: Diagnosis not present

## 2023-06-07 ENCOUNTER — Other Ambulatory Visit: Payer: BC Managed Care – PPO

## 2023-06-20 ENCOUNTER — Encounter: Payer: Self-pay | Admitting: Family Medicine

## 2023-07-06 ENCOUNTER — Ambulatory Visit
Admission: RE | Admit: 2023-07-06 | Discharge: 2023-07-06 | Disposition: A | Discharge status: Home / Self Care | Payer: BC Managed Care – PPO | Source: Ambulatory Visit | Attending: Family Medicine | Admitting: Family Medicine

## 2023-07-06 DIAGNOSIS — Z1501 Genetic susceptibility to malignant neoplasm of breast: Secondary | ICD-10-CM

## 2023-07-06 DIAGNOSIS — F331 Major depressive disorder, recurrent, moderate: Secondary | ICD-10-CM | POA: Diagnosis not present

## 2023-07-06 DIAGNOSIS — Z803 Family history of malignant neoplasm of breast: Secondary | ICD-10-CM | POA: Diagnosis not present

## 2023-07-06 DIAGNOSIS — Z1239 Encounter for other screening for malignant neoplasm of breast: Secondary | ICD-10-CM | POA: Diagnosis not present

## 2023-07-06 DIAGNOSIS — F902 Attention-deficit hyperactivity disorder, combined type: Secondary | ICD-10-CM | POA: Diagnosis not present

## 2023-07-06 MED ORDER — GADOPICLENOL 0.5 MMOL/ML IV SOLN
8.0000 mL | Freq: Once | INTRAVENOUS | Status: AC | PRN
  Administered 2023-07-06: 8 mL via INTRAVENOUS

## 2023-08-01 DIAGNOSIS — F331 Major depressive disorder, recurrent, moderate: Secondary | ICD-10-CM | POA: Diagnosis not present

## 2023-08-01 DIAGNOSIS — F902 Attention-deficit hyperactivity disorder, combined type: Secondary | ICD-10-CM | POA: Diagnosis not present

## 2023-08-29 DIAGNOSIS — F331 Major depressive disorder, recurrent, moderate: Secondary | ICD-10-CM | POA: Diagnosis not present

## 2023-08-29 DIAGNOSIS — F902 Attention-deficit hyperactivity disorder, combined type: Secondary | ICD-10-CM | POA: Diagnosis not present

## 2023-09-27 DIAGNOSIS — F324 Major depressive disorder, single episode, in partial remission: Secondary | ICD-10-CM | POA: Diagnosis not present

## 2023-09-27 DIAGNOSIS — Z0000 Encounter for general adult medical examination without abnormal findings: Secondary | ICD-10-CM | POA: Diagnosis not present

## 2023-09-27 DIAGNOSIS — F419 Anxiety disorder, unspecified: Secondary | ICD-10-CM | POA: Diagnosis not present

## 2023-09-27 DIAGNOSIS — E039 Hypothyroidism, unspecified: Secondary | ICD-10-CM | POA: Diagnosis not present

## 2023-09-27 DIAGNOSIS — Z79899 Other long term (current) drug therapy: Secondary | ICD-10-CM | POA: Diagnosis not present

## 2023-09-27 DIAGNOSIS — E559 Vitamin D deficiency, unspecified: Secondary | ICD-10-CM | POA: Diagnosis not present

## 2023-09-27 DIAGNOSIS — F3181 Bipolar II disorder: Secondary | ICD-10-CM | POA: Diagnosis not present

## 2023-09-27 DIAGNOSIS — Z9884 Bariatric surgery status: Secondary | ICD-10-CM | POA: Diagnosis not present

## 2023-09-27 DIAGNOSIS — R609 Edema, unspecified: Secondary | ICD-10-CM | POA: Diagnosis not present

## 2023-09-28 DIAGNOSIS — F902 Attention-deficit hyperactivity disorder, combined type: Secondary | ICD-10-CM | POA: Diagnosis not present

## 2023-09-28 DIAGNOSIS — F331 Major depressive disorder, recurrent, moderate: Secondary | ICD-10-CM | POA: Diagnosis not present

## 2023-09-29 DIAGNOSIS — N9089 Other specified noninflammatory disorders of vulva and perineum: Secondary | ICD-10-CM | POA: Diagnosis not present

## 2023-09-29 DIAGNOSIS — N760 Acute vaginitis: Secondary | ICD-10-CM | POA: Diagnosis not present

## 2023-10-10 DIAGNOSIS — F3342 Major depressive disorder, recurrent, in full remission: Secondary | ICD-10-CM | POA: Diagnosis not present

## 2023-10-10 DIAGNOSIS — F900 Attention-deficit hyperactivity disorder, predominantly inattentive type: Secondary | ICD-10-CM | POA: Diagnosis not present

## 2023-10-10 DIAGNOSIS — F410 Panic disorder [episodic paroxysmal anxiety] without agoraphobia: Secondary | ICD-10-CM | POA: Diagnosis not present

## 2023-10-24 DIAGNOSIS — K648 Other hemorrhoids: Secondary | ICD-10-CM | POA: Diagnosis not present

## 2023-10-24 DIAGNOSIS — Z8481 Family history of carrier of genetic disease: Secondary | ICD-10-CM | POA: Diagnosis not present

## 2023-10-24 DIAGNOSIS — Z1211 Encounter for screening for malignant neoplasm of colon: Secondary | ICD-10-CM | POA: Diagnosis not present

## 2023-11-17 DIAGNOSIS — E039 Hypothyroidism, unspecified: Secondary | ICD-10-CM | POA: Diagnosis not present

## 2023-11-17 DIAGNOSIS — F331 Major depressive disorder, recurrent, moderate: Secondary | ICD-10-CM | POA: Diagnosis not present

## 2023-11-17 DIAGNOSIS — F902 Attention-deficit hyperactivity disorder, combined type: Secondary | ICD-10-CM | POA: Diagnosis not present

## 2023-12-12 DIAGNOSIS — R600 Localized edema: Secondary | ICD-10-CM | POA: Diagnosis not present

## 2023-12-12 DIAGNOSIS — I872 Venous insufficiency (chronic) (peripheral): Secondary | ICD-10-CM | POA: Diagnosis not present

## 2023-12-12 DIAGNOSIS — I83893 Varicose veins of bilateral lower extremities with other complications: Secondary | ICD-10-CM | POA: Diagnosis not present

## 2023-12-12 DIAGNOSIS — I890 Lymphedema, not elsewhere classified: Secondary | ICD-10-CM | POA: Diagnosis not present

## 2023-12-12 DIAGNOSIS — G2581 Restless legs syndrome: Secondary | ICD-10-CM | POA: Diagnosis not present

## 2023-12-18 DIAGNOSIS — F331 Major depressive disorder, recurrent, moderate: Secondary | ICD-10-CM | POA: Diagnosis not present

## 2023-12-18 DIAGNOSIS — F902 Attention-deficit hyperactivity disorder, combined type: Secondary | ICD-10-CM | POA: Diagnosis not present

## 2024-01-08 DIAGNOSIS — F331 Major depressive disorder, recurrent, moderate: Secondary | ICD-10-CM | POA: Diagnosis not present

## 2024-01-08 DIAGNOSIS — F902 Attention-deficit hyperactivity disorder, combined type: Secondary | ICD-10-CM | POA: Diagnosis not present

## 2024-01-11 DIAGNOSIS — I890 Lymphedema, not elsewhere classified: Secondary | ICD-10-CM | POA: Diagnosis not present

## 2024-01-18 DIAGNOSIS — Z8249 Family history of ischemic heart disease and other diseases of the circulatory system: Secondary | ICD-10-CM | POA: Diagnosis not present

## 2024-01-18 DIAGNOSIS — E669 Obesity, unspecified: Secondary | ICD-10-CM | POA: Diagnosis not present

## 2024-01-18 DIAGNOSIS — Z809 Family history of malignant neoplasm, unspecified: Secondary | ICD-10-CM | POA: Diagnosis not present

## 2024-01-18 DIAGNOSIS — Z6832 Body mass index (BMI) 32.0-32.9, adult: Secondary | ICD-10-CM | POA: Diagnosis not present

## 2024-01-18 DIAGNOSIS — Z79890 Hormone replacement therapy (postmenopausal): Secondary | ICD-10-CM | POA: Diagnosis not present

## 2024-01-18 DIAGNOSIS — R32 Unspecified urinary incontinence: Secondary | ICD-10-CM | POA: Diagnosis not present

## 2024-01-18 DIAGNOSIS — G2581 Restless legs syndrome: Secondary | ICD-10-CM | POA: Diagnosis not present

## 2024-01-18 DIAGNOSIS — Z823 Family history of stroke: Secondary | ICD-10-CM | POA: Diagnosis not present

## 2024-01-18 DIAGNOSIS — E039 Hypothyroidism, unspecified: Secondary | ICD-10-CM | POA: Diagnosis not present

## 2024-01-18 DIAGNOSIS — K219 Gastro-esophageal reflux disease without esophagitis: Secondary | ICD-10-CM | POA: Diagnosis not present

## 2024-01-18 DIAGNOSIS — Z87891 Personal history of nicotine dependence: Secondary | ICD-10-CM | POA: Diagnosis not present

## 2024-01-18 DIAGNOSIS — F411 Generalized anxiety disorder: Secondary | ICD-10-CM | POA: Diagnosis not present

## 2024-01-30 DIAGNOSIS — F331 Major depressive disorder, recurrent, moderate: Secondary | ICD-10-CM | POA: Diagnosis not present

## 2024-01-30 DIAGNOSIS — F902 Attention-deficit hyperactivity disorder, combined type: Secondary | ICD-10-CM | POA: Diagnosis not present

## 2024-02-07 ENCOUNTER — Other Ambulatory Visit: Payer: Self-pay | Admitting: Family Medicine

## 2024-02-07 DIAGNOSIS — Z1231 Encounter for screening mammogram for malignant neoplasm of breast: Secondary | ICD-10-CM

## 2024-02-11 DIAGNOSIS — I890 Lymphedema, not elsewhere classified: Secondary | ICD-10-CM | POA: Diagnosis not present

## 2024-02-19 DIAGNOSIS — F902 Attention-deficit hyperactivity disorder, combined type: Secondary | ICD-10-CM | POA: Diagnosis not present

## 2024-02-19 DIAGNOSIS — F331 Major depressive disorder, recurrent, moderate: Secondary | ICD-10-CM | POA: Diagnosis not present

## 2024-03-12 DIAGNOSIS — I890 Lymphedema, not elsewhere classified: Secondary | ICD-10-CM | POA: Diagnosis not present

## 2024-03-18 ENCOUNTER — Encounter (HOSPITAL_COMMUNITY): Payer: Self-pay

## 2024-03-18 DIAGNOSIS — F331 Major depressive disorder, recurrent, moderate: Secondary | ICD-10-CM | POA: Diagnosis not present

## 2024-03-18 DIAGNOSIS — F902 Attention-deficit hyperactivity disorder, combined type: Secondary | ICD-10-CM | POA: Diagnosis not present

## 2024-03-25 ENCOUNTER — Ambulatory Visit
Admission: RE | Admit: 2024-03-25 | Discharge: 2024-03-25 | Disposition: A | Discharge status: Home / Self Care | Source: Ambulatory Visit | Attending: Family Medicine | Admitting: Family Medicine

## 2024-03-25 DIAGNOSIS — Z1231 Encounter for screening mammogram for malignant neoplasm of breast: Secondary | ICD-10-CM

## 2024-04-04 DIAGNOSIS — F410 Panic disorder [episodic paroxysmal anxiety] without agoraphobia: Secondary | ICD-10-CM | POA: Diagnosis not present

## 2024-04-04 DIAGNOSIS — F3342 Major depressive disorder, recurrent, in full remission: Secondary | ICD-10-CM | POA: Diagnosis not present

## 2024-04-04 DIAGNOSIS — F900 Attention-deficit hyperactivity disorder, predominantly inattentive type: Secondary | ICD-10-CM | POA: Diagnosis not present

## 2024-04-12 DIAGNOSIS — I890 Lymphedema, not elsewhere classified: Secondary | ICD-10-CM | POA: Diagnosis not present

## 2024-05-12 DIAGNOSIS — I890 Lymphedema, not elsewhere classified: Secondary | ICD-10-CM | POA: Diagnosis not present

## 2024-06-12 DIAGNOSIS — I890 Lymphedema, not elsewhere classified: Secondary | ICD-10-CM | POA: Diagnosis not present

## 2024-06-20 ENCOUNTER — Other Ambulatory Visit: Payer: Self-pay | Admitting: Family Medicine

## 2024-06-20 DIAGNOSIS — Z1501 Genetic susceptibility to malignant neoplasm of breast: Secondary | ICD-10-CM

## 2024-07-09 ENCOUNTER — Encounter: Payer: Self-pay | Admitting: Family Medicine

## 2024-07-13 DIAGNOSIS — I890 Lymphedema, not elsewhere classified: Secondary | ICD-10-CM | POA: Diagnosis not present

## 2024-07-14 ENCOUNTER — Ambulatory Visit
Admission: RE | Admit: 2024-07-14 | Discharge: 2024-07-14 | Disposition: A | Discharge status: Home / Self Care | Source: Ambulatory Visit | Attending: Family Medicine | Admitting: Family Medicine

## 2024-07-14 DIAGNOSIS — Z1501 Genetic susceptibility to malignant neoplasm of breast: Secondary | ICD-10-CM

## 2024-07-14 DIAGNOSIS — Z1239 Encounter for other screening for malignant neoplasm of breast: Secondary | ICD-10-CM | POA: Diagnosis not present

## 2024-07-14 DIAGNOSIS — R928 Other abnormal and inconclusive findings on diagnostic imaging of breast: Secondary | ICD-10-CM | POA: Diagnosis not present

## 2024-07-14 MED ORDER — GADOPICLENOL 0.5 MMOL/ML IV SOLN
8.0000 mL | Freq: Once | INTRAVENOUS | Status: AC | PRN
  Administered 2024-07-14: 8 mL via INTRAVENOUS

## 2024-07-23 ENCOUNTER — Other Ambulatory Visit: Payer: Self-pay | Admitting: Medical Genetics

## 2024-07-29 ENCOUNTER — Other Ambulatory Visit
Admission: RE | Admit: 2024-07-29 | Discharge: 2024-07-29 | Disposition: A | Discharge status: Home / Self Care | Payer: Self-pay | Source: Ambulatory Visit | Attending: Medical Genetics | Admitting: Medical Genetics

## 2024-07-30 ENCOUNTER — Other Ambulatory Visit

## 2024-08-06 LAB — GENECONNECT MOLECULAR SCREEN: Genetic Analysis Overall Interpretation: NEGATIVE

## 2024-08-12 DIAGNOSIS — I890 Lymphedema, not elsewhere classified: Secondary | ICD-10-CM | POA: Diagnosis not present

## 2024-09-12 DIAGNOSIS — I890 Lymphedema, not elsewhere classified: Secondary | ICD-10-CM | POA: Diagnosis not present

## 2024-09-30 DIAGNOSIS — F3342 Major depressive disorder, recurrent, in full remission: Secondary | ICD-10-CM | POA: Diagnosis not present

## 2024-09-30 DIAGNOSIS — F410 Panic disorder [episodic paroxysmal anxiety] without agoraphobia: Secondary | ICD-10-CM | POA: Diagnosis not present

## 2024-09-30 DIAGNOSIS — F900 Attention-deficit hyperactivity disorder, predominantly inattentive type: Secondary | ICD-10-CM | POA: Diagnosis not present

## 2024-10-02 DIAGNOSIS — F324 Major depressive disorder, single episode, in partial remission: Secondary | ICD-10-CM | POA: Diagnosis not present

## 2024-10-02 DIAGNOSIS — E669 Obesity, unspecified: Secondary | ICD-10-CM | POA: Diagnosis not present

## 2024-10-02 DIAGNOSIS — R609 Edema, unspecified: Secondary | ICD-10-CM | POA: Diagnosis not present

## 2024-10-02 DIAGNOSIS — Z0000 Encounter for general adult medical examination without abnormal findings: Secondary | ICD-10-CM | POA: Diagnosis not present

## 2024-10-02 DIAGNOSIS — E039 Hypothyroidism, unspecified: Secondary | ICD-10-CM | POA: Diagnosis not present

## 2024-10-02 DIAGNOSIS — Z79899 Other long term (current) drug therapy: Secondary | ICD-10-CM | POA: Diagnosis not present

## 2024-10-02 DIAGNOSIS — Z9884 Bariatric surgery status: Secondary | ICD-10-CM | POA: Diagnosis not present

## 2024-10-02 DIAGNOSIS — Z1509 Genetic susceptibility to other malignant neoplasm: Secondary | ICD-10-CM | POA: Diagnosis not present

## 2024-10-02 DIAGNOSIS — Z1501 Genetic susceptibility to malignant neoplasm of breast: Secondary | ICD-10-CM | POA: Diagnosis not present

## 2024-10-02 DIAGNOSIS — F3181 Bipolar II disorder: Secondary | ICD-10-CM | POA: Diagnosis not present

## 2024-10-02 DIAGNOSIS — A6000 Herpesviral infection of urogenital system, unspecified: Secondary | ICD-10-CM | POA: Diagnosis not present

## 2024-10-02 DIAGNOSIS — E559 Vitamin D deficiency, unspecified: Secondary | ICD-10-CM | POA: Diagnosis not present

## 2024-10-02 DIAGNOSIS — F419 Anxiety disorder, unspecified: Secondary | ICD-10-CM | POA: Diagnosis not present

## 2024-10-02 DIAGNOSIS — N951 Menopausal and female climacteric states: Secondary | ICD-10-CM | POA: Diagnosis not present

## 2024-10-12 DIAGNOSIS — I890 Lymphedema, not elsewhere classified: Secondary | ICD-10-CM | POA: Diagnosis not present

## 2024-10-21 DIAGNOSIS — F331 Major depressive disorder, recurrent, moderate: Secondary | ICD-10-CM | POA: Diagnosis not present
